# Patient Record
Sex: Male | Born: 1955 | Race: Black or African American | Hispanic: No
Health system: Midwestern US, Community
[De-identification: ages and names within clinical notes are randomized; demographics above are authoritative.]

## PROBLEM LIST (undated history)

## (undated) DIAGNOSIS — M1990 Unspecified osteoarthritis, unspecified site: Secondary | ICD-10-CM

## (undated) DIAGNOSIS — M109 Gout, unspecified: Secondary | ICD-10-CM

## (undated) DIAGNOSIS — J449 Chronic obstructive pulmonary disease, unspecified: Secondary | ICD-10-CM

## (undated) DIAGNOSIS — E119 Type 2 diabetes mellitus without complications: Secondary | ICD-10-CM

## (undated) DIAGNOSIS — I10 Essential (primary) hypertension: Secondary | ICD-10-CM

## (undated) HISTORY — PX: HERNIA REPAIR: SHX51

## (undated) HISTORY — PX: APPENDECTOMY: SHX54

---

## 2001-05-09 ENCOUNTER — Emergency Department (HOSPITAL_COMMUNITY): Admission: EM | Admit: 2001-05-09 | Discharge: 2001-05-10 | Payer: Self-pay | Admitting: Emergency Medicine

## 2001-05-13 ENCOUNTER — Emergency Department (HOSPITAL_COMMUNITY): Admission: EM | Admit: 2001-05-13 | Discharge: 2001-05-13 | Payer: Self-pay | Admitting: *Deleted

## 2001-05-13 ENCOUNTER — Encounter: Payer: Self-pay | Admitting: Emergency Medicine

## 2001-07-10 ENCOUNTER — Emergency Department (HOSPITAL_COMMUNITY): Admission: EM | Admit: 2001-07-10 | Discharge: 2001-07-10 | Payer: Self-pay | Admitting: Emergency Medicine

## 2001-07-10 ENCOUNTER — Encounter: Payer: Self-pay | Admitting: Emergency Medicine

## 2012-08-08 MED ADMIN — benazepril/hydrochlorothiazide (LOTENSIN HCT) 10/12.5 mg: ORAL | @ 16:00:00 | NDC 51079014501

## 2012-08-08 MED ADMIN — ibuprofen (MOTRIN) tablet 400 mg: ORAL | @ 16:00:00 | NDC 68084065811

## 2012-08-08 MED FILL — IBUPROFEN 400 MG TAB: 400 mg | ORAL | Qty: 1

## 2012-08-08 MED FILL — BENAZEPRIL 10 MG TAB: 10 mg | ORAL | Qty: 1

## 2012-08-08 NOTE — ED Notes (Signed)
Patient resting quietly in bed. Respirations present. No distress noted. No interventions needed at this time. All normal and/or abnormal vital signs reported per protocol. No orders received at this time.

## 2012-08-08 NOTE — ED Notes (Signed)
Awaiting med from pharmacy for administration

## 2012-08-08 NOTE — ED Notes (Signed)
Headache, out of bp meds times 2 weeks.  Unsure of the med he takes and is from [place] originally.  Keeps stating that he needs his BP meds and he will be fine.

## 2012-08-08 NOTE — ED Notes (Signed)
I have reviewed discharge instructions with the patient.  The patient and spouse verbalized understanding.

## 2012-08-08 NOTE — ED Provider Notes (Signed)
HPI Comments: 57 bm complains of headache because he is out of his blood pressure medications. (he does not remember what he is on) he did say it was "something hctz". No vomiting or fever. No neck pain.     Patient is a 57 y.o. male presenting with headaches.   Headache   Pertinent negatives include no fever and no shortness of breath.        Past Medical History   Diagnosis Date   ??? Hypertension         Past Surgical History   Procedure Laterality Date   ??? Hx appendectomy     ??? Hx other surgical  hernia         History reviewed. No pertinent family history.     History     Social History   ??? Marital Status: LEGALLY SEPARATED     Spouse Name: N/A     Number of Children: N/A   ??? Years of Education: N/A     Occupational History   ??? Not on file.     Social History Main Topics   ??? Smoking status: Current Every Day Smoker -- 1.00 packs/day   ??? Smokeless tobacco: Not on file   ??? Alcohol Use: Yes      Comment: moderately   ??? Drug Use: No   ??? Sexually Active: Not on file     Other Topics Concern   ??? Not on file     Social History Narrative   ??? No narrative on file                  ALLERGIES: Penicillins      Review of Systems   Constitutional: Negative for fever and chills.   HENT: Negative for sore throat, trouble swallowing, neck pain, neck stiffness and sinus pressure.    Eyes: Negative for visual disturbance.   Respiratory: Negative for chest tightness and shortness of breath.    Cardiovascular: Negative for chest pain.   Gastrointestinal: Negative for abdominal pain and constipation.   Musculoskeletal: Negative for back pain.   Skin: Negative for rash.   Neurological: Positive for headaches.   All other systems reviewed and are negative.        Filed Vitals:    08/08/12 1137   BP: 134/92   Pulse: 90   Temp: 98.3 ??F (36.8 ??C)   Resp: 16   Height: 5\' 9"  (1.753 m)   Weight: 108.863 kg (240 lb)   SpO2: 97%            Physical Exam   Nursing note and vitals reviewed.  Constitutional: He is oriented to person, place, and  time. He appears well-developed and well-nourished. No distress.   HENT:   Head: Normocephalic and atraumatic.   Right Ear: External ear normal.   Left Ear: External ear normal.   Nose: Nose normal.   Mouth/Throat: Oropharynx is clear and moist. No oropharyngeal exudate.   Eyes: Conjunctivae and EOM are normal. Pupils are equal, round, and reactive to light. Right eye exhibits no discharge. Left eye exhibits no discharge. No scleral icterus.   Neck: Normal range of motion. Neck supple. No JVD present.   Cardiovascular: Normal rate, regular rhythm, normal heart sounds and intact distal pulses.  Exam reveals no gallop and no friction rub.    No murmur heard.  Pulmonary/Chest: Effort normal and breath sounds normal. No stridor. No respiratory distress.   Abdominal: Soft. Bowel sounds are normal. There is no  tenderness. There is no rebound.   Musculoskeletal: Normal range of motion. He exhibits no tenderness.   Neurological: He is alert and oriented to person, place, and time. He displays normal reflexes. No cranial nerve deficit. He exhibits normal muscle tone. Coordination normal.   Skin: Skin is warm. No rash noted.        MDM    Procedures

## 2015-12-14 DIAGNOSIS — Z139 Encounter for screening, unspecified: Secondary | ICD-10-CM

## 2015-12-15 ENCOUNTER — Ambulatory Visit (INDEPENDENT_AMBULATORY_CARE_PROVIDER_SITE_OTHER): Payer: Medicaid Other | Admitting: Family Medicine

## 2015-12-15 ENCOUNTER — Encounter: Payer: Self-pay | Admitting: Family Medicine

## 2015-12-15 DIAGNOSIS — Z8709 Personal history of other diseases of the respiratory system: Secondary | ICD-10-CM | POA: Insufficient documentation

## 2015-12-15 DIAGNOSIS — I10 Essential (primary) hypertension: Secondary | ICD-10-CM | POA: Diagnosis not present

## 2015-12-15 DIAGNOSIS — R739 Hyperglycemia, unspecified: Secondary | ICD-10-CM | POA: Diagnosis not present

## 2015-12-15 LAB — BASIC METABOLIC PANEL WITH GFR
BUN: 15 mg/dL (ref 7–25)
CO2: 24 mmol/L (ref 20–31)
Calcium: 9.7 mg/dL (ref 8.6–10.3)
Chloride: 102 mmol/L (ref 98–110)
Creat: 1.14 mg/dL (ref 0.70–1.25)
GFR, Est African American: 80 mL/min (ref >=60)
GFR, Est Non African American: 69 mL/min (ref >=60)
Glucose, Bld: 141 mg/dL — ABNORMAL HIGH (ref 65–99)
Potassium: 3.6 mmol/L (ref 3.5–5.3)
Sodium: 141 mmol/L (ref 135–146)

## 2015-12-15 LAB — CBC
HCT: 41.5 % (ref 38.5–50.0)
Hemoglobin: 13.6 g/dL (ref 13.2–17.1)
MCH: 28.5 pg (ref 27.0–33.0)
MCHC: 32.8 g/dL (ref 32.0–36.0)
MCV: 86.8 fL (ref 80.0–100.0)
MPV: 12.6 fL — ABNORMAL HIGH (ref 7.5–12.5)
Platelets: 253 10*3/uL (ref 140–400)
RBC: 4.78 MIL/uL (ref 4.20–5.80)
RDW: 16.2 % — ABNORMAL HIGH (ref 11.0–15.0)
WBC: 7.5 10*3/uL (ref 3.8–10.8)

## 2015-12-15 LAB — POCT GLYCOSYLATED HEMOGLOBIN (HGB A1C): Hemoglobin A1C: 7.5

## 2015-12-15 MED ORDER — HYDROCHLOROTHIAZIDE 25 MG PO TABS: 25.0000 mg | ORAL_TABLET | Freq: Every day | ORAL | 2 refills | Status: DC

## 2015-12-15 MED ORDER — LISINOPRIL 40 MG PO TABS: 40.0000 mg | ORAL_TABLET | Freq: Every day | ORAL | 2 refills | Status: DC

## 2015-12-15 NOTE — Patient Instructions (Signed)
It was a pleasure to meet you today. Please see below to review our plan for today's visit.  1. I have refilled you prescriptions for your HCTZ and lisinopril to [facility] off [address]. Phone number is [phone] if you need to reach them. 2. Please stop smoking because this will make it difficult to help you COPD or other medical problems. 3. I will see you back in 1 month.  Please call the clinic at [phone] if your symptoms worsen or you have any concerns. It was my pleasure to see you. -- Durward Parcelavid Lujean Ebright, DO [facility], PGY-1  Smoking Cessation, Tips for Success If you are ready to quit smoking, congratulations! You have chosen to help yourself be healthier. Cigarettes bring nicotine, tar, carbon monoxide, and other irritants into your body. Your lungs, heart, and blood vessels will be able to work better without these poisons. There are many different ways to quit smoking. Nicotine gum, nicotine patches, a nicotine inhaler, or nicotine nasal spray can help with physical craving. Hypnosis, support groups, and medicines help break the habit of smoking. WHAT THINGS CAN I DO TO MAKE QUITTING EASIER?  Here are some tips to help you quit for good:  Pick a date when you will quit smoking completely. Tell all of your friends and family about your plan to quit on that date.  Do not try to slowly cut down on the number of cigarettes you are smoking. Pick a quit date and quit smoking completely starting on that day.  Throw away all cigarettes.   Clean and remove all ashtrays from your home, work, and car.  On a card, write down your reasons for quitting. Carry the card with you and read it when you get the urge to smoke.  Cleanse your body of nicotine. Drink enough water and fluids to keep your urine clear or pale yellow. Do this after quitting to flush the nicotine from your body.  Learn to predict your moods. Do not let a bad situation be your excuse to  have a cigarette. Some situations in your life might tempt you into wanting a cigarette.  Never have "just one" cigarette. It leads to wanting another and another. Remind yourself of your decision to quit.  Change habits associated with smoking. If you smoked while driving or when feeling stressed, try other activities to replace smoking. Stand up when drinking your coffee. Brush your teeth after eating. Sit in a different chair when you read the paper. Avoid alcohol while trying to quit, and try to drink fewer caffeinated beverages. Alcohol and caffeine may urge you to smoke.  Avoid foods and drinks that can trigger a desire to smoke, such as sugary or spicy foods and alcohol.  Ask people who smoke not to smoke around you.  Have something planned to do right after eating or having a cup of coffee. For example, plan to take a walk or exercise.  Try a relaxation exercise to calm you down and decrease your stress. Remember, you may be tense and nervous for the first 2 weeks after you quit, but this will pass.  Find new activities to keep your hands busy. Play with a pen, coin, or rubber band. Doodle or draw things on paper.  Brush your teeth right after eating. This will help cut down on the craving for the taste of tobacco after meals. You can also try mouthwash.   Use oral substitutes in place of cigarettes. Try using lemon  drops, carrots, cinnamon sticks, or chewing gum. Keep them handy so they are available when you have the urge to smoke.  When you have the urge to smoke, try deep breathing.  Designate your home as a nonsmoking area.  If you are a heavy smoker, ask your health care provider about a prescription for nicotine chewing gum. It can ease your withdrawal from nicotine.  Reward yourself. Set aside the cigarette money you save and buy yourself something nice.  Look for support from others. Join a support group or smoking cessation program. Ask someone at home or at work to  help you with your plan to quit smoking.  Always ask yourself, "Do I need this cigarette or is this just a reflex?" Tell yourself, "Today, I choose not to smoke," or "I do not want to smoke." You are reminding yourself of your decision to quit.  Do not replace cigarette smoking with electronic cigarettes (commonly called e-cigarettes). The safety of e-cigarettes is unknown, and some may contain harmful chemicals.  If you relapse, do not give up! Plan ahead and think about what you will do the next time you get the urge to smoke. HOW WILL I FEEL WHEN I QUIT SMOKING? You may have symptoms of withdrawal because your body is used to nicotine (the addictive substance in cigarettes). You may crave cigarettes, be irritable, feel very hungry, cough often, get headaches, or have difficulty concentrating. The withdrawal symptoms are only temporary. They are strongest when you first quit but will go away within 10-14 days. When withdrawal symptoms occur, stay in control. Think about your reasons for quitting. Remind yourself that these are signs that your body is healing and getting used to being without cigarettes. Remember that withdrawal symptoms are easier to treat than the major diseases that smoking can cause.  Even after the withdrawal is over, expect periodic urges to smoke. However, these cravings are generally short lived and will go away whether you smoke or not. Do not smoke! WHAT RESOURCES ARE AVAILABLE TO HELP ME QUIT SMOKING? Your health care provider can direct you to community resources or hospitals for support, which may include:  Group support.  Education.  Hypnosis.  Therapy.   This information is not intended to replace advice given to you by your health care provider. Make sure you discuss any questions you have with your health care provider.   Document Released: 11/25/2003 Document Revised: 03/19/2014 Document Reviewed: 08/14/2012 Elsevier Interactive Patient Education [employer].

## 2015-12-15 NOTE — Progress Notes (Signed)
Subjective:   Patient ID: Ernest White    DOB: June 01, 1955, 60 y.o. male   MRN: [id number]  CC: New patient visit  HPI: Ernest White is a 60 y.o. male who presents to clinic today to establish as a new patient. Problems discussed today are as follows:   1. Hypertension: takes HCTZ and lisinopril. Says he takes his medications regularly and check BP occasionally. Denies symptoms of headache, change in vision, or feeling of lightheadedness.  2. History of COPD: patient says he has COPD. Never seen a pulmonologist and no history of COPD exacerbation. Says his only medication is an inhaler which he left at home but cannot recall name. Says he has home O2 but left the canister in [place] he moved from. Has not needed it since moving to [place]? Significant 40+ pack year, continues to smoke 1 pack per day.  3. Hyperglycemia: patient denies history of diabetes. Denies taking insulin in the past. Says he is experiencing polyuria, polyphagia, and polydipsia.   ROS: See HPI for pertinent ROS.  PMFSH: Pertinent past medical, surgical, family, and social history were reviewed and updated as appropriate. Smoking status reviewed.  Medications reviewed. Current Outpatient Prescriptions  Medication Sig Dispense Refill  . B Complex-C (B-COMPLEX WITH VITAMIN C) tablet Take 1 tablet by mouth daily.    . hydrochlorothiazide (HYDRODIURIL) 25 MG tablet Take 1 tablet (25 mg total) by mouth daily. 90 tablet 2  . lisinopril (PRINIVIL,ZESTRIL) 40 MG tablet Take 1 tablet (40 mg total) by mouth daily. 90 tablet 2   No current facility-administered medications for this visit.     Objective:   BP (!) 141/99   Pulse 100   Temp 97.6 F (36.4 C)   Wt 267 lb 3.2 oz (121.2 kg)   SpO2 98%  Vitals and nursing note reviewed.  General: obese, well nourished, well developed, in no acute distress with non-toxic appearance HEENT: normocephalic, atraumatic, moist mucous membranes Neck: supple, non-tender without  lymphadenopathy CV: regular rate and rhythm without murmurs, rubs, or gallops Lungs: clear to auscultation bilaterally with normal work of breathing Abdomen: soft, non-tender, non-distended, no masses or organomegaly palpable, normoactive bowel sounds Skin: warm, dry, no rashes or lesions, cap refill < 2 seconds, scar from appendectomy appreciated, no herniation Extremities: warm and well perfused, normal tone  Assessment & Plan:   Essential hypertension Chronic. BP 141/99 in office today. Risk factors include smoking and obesity.  - Lisinopril 40 g QD - HCTZ 25 mg QD - Encouraged patient to stop smoking, patient aware but unwilling today - CBC and BMET pending  History of COPD Uncertain extent of COPD due to vague history and no documentation from previous practice. Will need to reach out to previous PCP. Patient continues to smoke. Patient did not bring inhaler meds and is suppose to be on supplemental O2 but left it in [place] when he moved but no increased work of breathing during exam. - Patient to bring inhaler meds to next visit, will call if he needs refills - Encouraged patient to ask someone to bring his O2 down from [place] where he left it - Counseled patient on smoking cessation  Hyperglycemia A1c 7.5 in office today, diagnostic for diabetes. Patient is obese and does not take diabetes medications or insulin. - Patient counseled on restricting excessive carbs in diet - Encouraged exercising 150 mins per week - Patient will need to begin diabetes education and medication during next visit given A1c  Orders Placed This  Encounter  Procedures  . BASIC METABOLIC PANEL WITH GFR  . CBC  . POCT glycosylated hemoglobin (Hb A1C)   Meds ordered this encounter  Medications  . DISCONTD: lisinopril (PRINIVIL,ZESTRIL) 40 MG tablet    Sig: Take 40 mg by mouth daily.  Marland Kitchen DISCONTD: hydrochlorothiazide (HYDRODIURIL) 25 MG tablet    Sig: Take 25 mg by mouth daily.  . B Complex-C (B-COMPLEX WITH  VITAMIN C) tablet    Sig: Take 1 tablet by mouth daily.  Marland Kitchen lisinopril (PRINIVIL,ZESTRIL) 40 MG tablet    Sig: Take 1 tablet (40 mg total) by mouth daily.    Dispense:  90 tablet    Refill:  2  . hydrochlorothiazide (HYDRODIURIL) 25 MG tablet    Sig: Take 1 tablet (25 mg total) by mouth daily.    Dispense:  90 tablet    Refill:  2    Durward Parcel, DO [facility], PGY-1 12/15/2015 8:37 PM

## 2015-12-15 NOTE — Assessment & Plan Note (Signed)
A1c 7.5 in office today, diagnostic for diabetes. Patient is obese and does not take diabetes medications or insulin. - Patient counseled on restricting excessive carbs in diet - Encouraged exercising 150 mins per week - Patient will need to begin diabetes education and medication during next visit given A1c

## 2015-12-15 NOTE — Assessment & Plan Note (Signed)
Uncertain extent of COPD due to vague history and no documentation from previous practice. Will need to reach out to previous PCP. Patient continues to smoke. Patient did not bring inhaler meds and is suppose to be on supplemental O2 but left it in [place] when he moved but no increased work of breathing during exam. - Patient to bring inhaler meds to next visit, will call if he needs refills - Encouraged patient to ask someone to bring his O2 down from [place] where he left it - Counseled patient on smoking cessation

## 2015-12-15 NOTE — Assessment & Plan Note (Addendum)
Chronic. BP 141/99 in office today. Risk factors include smoking and obesity.  - Lisinopril 40 g QD - HCTZ 25 mg QD - Encouraged patient to stop smoking, patient aware but unwilling today - CBC and BMET pending

## 2015-12-22 ENCOUNTER — Telehealth: Payer: Self-pay | Admitting: Family Medicine

## 2015-12-22 NOTE — Telephone Encounter (Signed)
Pt has been on oxygen 24/7, but is out. Pt has an appointment 12-27-15. Please call [phone].Please advise. Thanks! ep

## 2015-12-23 ENCOUNTER — Encounter: Payer: Self-pay | Admitting: Internal Medicine

## 2015-12-23 ENCOUNTER — Ambulatory Visit (INDEPENDENT_AMBULATORY_CARE_PROVIDER_SITE_OTHER): Payer: Medicaid Other | Admitting: Internal Medicine

## 2015-12-23 ENCOUNTER — Encounter: Payer: Self-pay | Admitting: Pediatric Intensive Care

## 2015-12-23 DIAGNOSIS — J441 Chronic obstructive pulmonary disease with (acute) exacerbation: Secondary | ICD-10-CM | POA: Diagnosis not present

## 2015-12-23 DIAGNOSIS — J449 Chronic obstructive pulmonary disease, unspecified: Secondary | ICD-10-CM | POA: Insufficient documentation

## 2015-12-23 MED ORDER — PREDNISONE 50 MG PO TABS: ORAL_TABLET | ORAL | 0 refills | Status: DC

## 2015-12-23 MED ORDER — DOXYCYCLINE HYCLATE 100 MG PO CAPS: 100.0000 mg | ORAL_CAPSULE | Freq: Two times a day (BID) | ORAL | 0 refills | Status: DC

## 2015-12-23 MED ORDER — ALBUTEROL SULFATE HFA 108 (90 BASE) MCG/ACT IN AERS: 2.0000 | INHALATION_SPRAY | Freq: Four times a day (QID) | RESPIRATORY_TRACT | 2 refills | Status: DC | PRN

## 2015-12-23 NOTE — Progress Notes (Signed)
   [facility] Noralee CharsAsiyah Mikell, MD Phone: [phone]  Reason For Visit:  SDA for Worsening Cough   # Patient with a hx of COPD presenting with worsening cough and sputum production over the past 2 days. He feels that he is coming down with cold. Patient with increasing yellow-green sputum production. Patient has been having worsening cough. He is a currently out of his albuterol inhaler. Patient denies any fever or chills, nausea or vomiting.  Patient is new to area and recently established care with Dr. Abelardo DieselMcMullen.   Past Medical History Reviewed problem list.  Medications- reviewed and updated No additions to family history Social history- patient is a current smoker  Objective: BP (!) 143/92   Pulse (!) 113   Temp 97.7 F (36.5 C) (Oral)   Wt 269 lb (122 kg)   SpO2 91%  Gen: NAD, alert, cooperative with exam,   HEENT: Normal    Neck: No masses palpated. No lymphadenopathy    Ears: Tympanic membranes intact, normal light reflex, no erythema, no bulging    Nose: nasal turbinates erythematous and congested     Throat: moist mucus membranes, no erythema Cardio: regular rate and rhythm, S1S2 heard, no murmurs appreciated Pulm: clear to auscultation bilaterally, no wheezes, rhonchi or rales Skin: dry, intact, no rashes or lesions   Assessment/Plan: See problem based a/p  COPD exacerbation (HCC) COPD exacerbation, mild - with worsening cough and sputum production.  Exam without significant wheezing or rhonchi. Repeat pulse on 86 on exam, otherwise vitals wnl, pulse likely elevated from rushing to get to clinic  - predniSONE (DELTASONE) 50 MG tablet; Take 1 pill daily for 5 days.  Dispense: 5 tablet; Refill: 0 - doxycycline (VIBRAMYCIN) 100 MG capsule; Take 1 capsule (100 mg total) by mouth 2 (two) times daily.  Dispense: 14 capsule; Refill: 0 - albuterol (PROVENTIL HFA;VENTOLIN HFA) 108 (90 Base) MCG/ACT inhaler; Inhale 2 puffs into the lungs every 6 (six)  hours as needed for wheezing or shortness of breath.  Dispense: 1 Inhaler; Refill: 2

## 2015-12-23 NOTE — Telephone Encounter (Signed)
Patient seen at appointment today for SOB, will follow up with PCP as far as getting O2 ordered. FYI to PCP

## 2015-12-23 NOTE — Patient Instructions (Signed)
Please take prednisone one pill for 5 days. Please take doxycycline 1 pill morning one pill at night. You continue your albuterol inhaler ( 2 puffs) every 4-6 hours as needed during this exacerbation.  Chronic Obstructive Pulmonary Disease Chronic obstructive pulmonary disease (COPD) is a common lung condition in which airflow from the lungs is limited. COPD is a general term that can be used to describe many different lung problems that limit airflow, including both chronic bronchitis and emphysema. If you have COPD, your lung function will probably never return to normal, but there are measures you can take to improve lung function and make yourself feel better. CAUSES   Smoking (common).  Exposure to secondhand smoke.  Genetic problems.  Chronic inflammatory lung diseases or recurrent infections. SYMPTOMS  Shortness of breath, especially with physical activity.  Deep, persistent (chronic) cough with a large amount of thick mucus.  Wheezing.  Rapid breaths (tachypnea).  Gray or bluish discoloration (cyanosis) of the skin, especially in your fingers, toes, or lips.  Fatigue.  Weight loss.  Frequent infections or episodes when breathing symptoms become much worse (exacerbations).  Chest tightness. DIAGNOSIS Your health care provider will take a medical history and perform a physical examination to diagnose COPD. Additional tests for COPD may include:  Lung (pulmonary) function tests.  Chest X-ray.  CT scan.  Blood tests. TREATMENT  Treatment for COPD may include:  Inhaler and nebulizer medicines. These help manage the symptoms of COPD and make your breathing more comfortable.  Supplemental oxygen. Supplemental oxygen is only helpful if you have a low oxygen level in your blood.  Exercise and physical activity. These are beneficial for nearly all people with COPD.  Lung surgery or transplant.  Nutrition therapy to gain weight, if you are underweight.  Pulmonary  rehabilitation. This may involve working with a team of health care providers and specialists, such as respiratory, occupational, and physical therapists. HOME CARE INSTRUCTIONS  Take all medicines (inhaled or pills) as directed by your health care provider.  Avoid over-the-counter medicines or cough syrups that dry up your airway (such as antihistamines) and slow down the elimination of secretions unless instructed otherwise by your health care provider.  If you are a smoker, the most important thing that you can do is stop smoking. Continuing to smoke will cause further lung damage and breathing trouble. Ask your health care provider for help with quitting smoking. He or she can direct you to community resources or hospitals that provide support.  Avoid exposure to irritants such as smoke, chemicals, and fumes that aggravate your breathing.  Use oxygen therapy and pulmonary rehabilitation if directed by your health care provider. If you require home oxygen therapy, ask your health care provider whether you should purchase a pulse oximeter to measure your oxygen level at home.  Avoid contact with individuals who have a contagious illness.  Avoid extreme temperature and humidity changes.  Eat healthy foods. Eating smaller, more frequent meals and resting before meals may help you maintain your strength.  Stay active, but balance activity with periods of rest. Exercise and physical activity will help you maintain your ability to do things you want to do.  Preventing infection and hospitalization is very important when you have COPD. Make sure to receive all the vaccines your health care provider recommends, especially the pneumococcal and influenza vaccines. Ask your health care provider whether you need a pneumonia vaccine.  Learn and use relaxation techniques to manage stress.  Learn and use controlled breathing  techniques as directed by your health care provider. Controlled breathing  techniques include:  Pursed lip breathing. Start by breathing in (inhaling) through your nose for 1 second. Then, purse your lips as if you were going to whistle and breathe out (exhale) through the pursed lips for 2 seconds.  Diaphragmatic breathing. Start by putting one hand on your abdomen just above your waist. Inhale slowly through your nose. The hand on your abdomen should move out. Then purse your lips and exhale slowly. You should be able to feel the hand on your abdomen moving in as you exhale.  Learn and use controlled coughing to clear mucus from your lungs. Controlled coughing is a series of short, progressive coughs. The steps of controlled coughing are: 1. Lean your head slightly forward. 2. Breathe in deeply using diaphragmatic breathing. 3. Try to hold your breath for 3 seconds. 4. Keep your mouth slightly open while coughing twice. 5. Spit any mucus out into a tissue. 6. Rest and repeat the steps once or twice as needed. SEEK MEDICAL CARE IF:  You are coughing up more mucus than usual.  There is a change in the color or thickness of your mucus.  Your breathing is more labored than usual.  Your breathing is faster than usual. SEEK IMMEDIATE MEDICAL CARE IF:  You have shortness of breath while you are resting.  You have shortness of breath that prevents you from:  Being able to talk.  Performing your usual physical activities.  You have chest pain lasting longer than 5 minutes.  Your skin color is more cyanotic than usual.  You measure low oxygen saturations for longer than 5 minutes with a pulse oximeter. MAKE SURE YOU:  Understand these instructions.  Will watch your condition.  Will get help right away if you are not doing well or get worse.   This information is not intended to replace advice given to you by your health care provider. Make sure you discuss any questions you have with your health care provider.   Document Released: 12/06/2004 Document  Revised: 03/19/2014 Document Reviewed: 10/23/2012 Elsevier Interactive Patient Education [employer]2016 [employer]

## 2015-12-23 NOTE — Congregational Nurse Program (Signed)
Congregational Nurse Program Note  Date of Encounter: 12/14/2015  Past Medical History: No past medical history on file.  Encounter Details:     CNP Questionnaire - 12/14/15 0954      Patient Demographics   Is this a new or existing patient? New   Patient is considered a/an Not Applicable   Race African-American/Black     Patient Assistance   Location of Patient Assistance Not Applicable   Patient's financial/insurance status Low Income;Medicaid;Medicare   Uninsured Patient No   Patient referred to apply for the following financial assistance Not Applicable   Food insecurities addressed Not Applicable   Transportation assistance Yes   Type of Assistance Bus Pass Given   Assistance securing medications No   Educational health offerings Chronic disease     Encounter Details   Primary purpose of visit Chronic Illness/Condition Visit;Navigating the Healthcare System   Was an Emergency Department visit averted? Not Applicable   Does patient have a medical provider? No   Patient referred to Private Practice   Was a mental health screening completed? (GAINS tool) No   Does patient have dental issues? No   Does patient have vision issues? No   Does your patient have an abnormal blood pressure today? No   Since previous encounter, have you referred patient for abnormal blood pressure that resulted in a new diagnosis or medication change? No   Does your patient have an abnormal blood glucose today? No   Since previous encounter, have you referred patient for abnormal blood glucose that resulted in a new diagnosis or medication change? No   Was there a life-saving intervention made? No         Amb Nursing Assessment - 12/15/15 0949      Pre-visit preparation   Pre-visit preparation completed Yes     Abuse/Neglect Assessment   Do you feel unsafe in your current relationship? No   Do you feel physically threatened by others? No   Anyone hurting you at home, work, or school? No    Unable to ask? No     [facility] the shelter.  Has multiple health problems.  Assisted client with making appointment with [facility].  Bus passes provided

## 2015-12-26 ENCOUNTER — Encounter: Payer: Self-pay | Admitting: Pediatric Intensive Care

## 2015-12-26 NOTE — Assessment & Plan Note (Signed)
COPD exacerbation, mild - with worsening cough and sputum production.  Exam without significant wheezing or rhonchi. Repeat pulse on 86 on exam, otherwise vitals wnl, pulse likely elevated from rushing to get to clinic  - predniSONE (DELTASONE) 50 MG tablet; Take 1 pill daily for 5 days.  Dispense: 5 tablet; Refill: 0 - doxycycline (VIBRAMYCIN) 100 MG capsule; Take 1 capsule (100 mg total) by mouth 2 (two) times daily.  Dispense: 14 capsule; Refill: 0 - albuterol (PROVENTIL HFA;VENTOLIN HFA) 108 (90 Base) MCG/ACT inhaler; Inhale 2 puffs into the lungs every 6 (six) hours as needed for wheezing or shortness of breath.  Dispense: 1 Inhaler; Refill: 2

## 2015-12-27 ENCOUNTER — Ambulatory Visit (INDEPENDENT_AMBULATORY_CARE_PROVIDER_SITE_OTHER): Payer: Medicaid Other | Admitting: Family Medicine

## 2015-12-27 ENCOUNTER — Encounter: Payer: Self-pay | Admitting: Family Medicine

## 2015-12-27 ENCOUNTER — Ambulatory Visit (HOSPITAL_COMMUNITY)
Admission: RE | Admit: 2015-12-27 | Discharge: 2015-12-27 | Disposition: A | Discharge status: Home / Self Care | Payer: Medicaid Other | Source: Ambulatory Visit | Attending: Family Medicine | Admitting: Family Medicine

## 2015-12-27 DIAGNOSIS — R55 Syncope and collapse: Secondary | ICD-10-CM | POA: Insufficient documentation

## 2015-12-27 DIAGNOSIS — Z8709 Personal history of other diseases of the respiratory system: Secondary | ICD-10-CM | POA: Diagnosis not present

## 2015-12-27 DIAGNOSIS — J441 Chronic obstructive pulmonary disease with (acute) exacerbation: Secondary | ICD-10-CM

## 2015-12-27 DIAGNOSIS — E1165 Type 2 diabetes mellitus with hyperglycemia: Secondary | ICD-10-CM

## 2015-12-27 DIAGNOSIS — E119 Type 2 diabetes mellitus without complications: Secondary | ICD-10-CM | POA: Insufficient documentation

## 2015-12-27 MED ORDER — MOMETASONE FURO-FORMOTEROL FUM 200-5 MCG/ACT IN AERO: 2.0000 | INHALATION_SPRAY | Freq: Two times a day (BID) | RESPIRATORY_TRACT | 0 refills | Status: DC

## 2015-12-27 NOTE — Assessment & Plan Note (Addendum)
Uncontrolled. Recently tx for exacerbation. On last day of prednisone 50 mg for 5 day course. Also completing 7 day course of doxycycline. Says he needs O2 for home but does not qualify due to sat staying above 95% during [facility]. Not on controller medication and needs rescue inhaler refill in next few weeks. - Given sample of Dulera, to take BID - Told to decrease albuterol use to only PRN - No indications for home O2 at this time - To complete course of prednisone and doxy - Educated on smoking cessation, will consider referral to Dr. Raymondo BandKoval during next visit - F/u in 2 weeks

## 2015-12-27 NOTE — Assessment & Plan Note (Addendum)
Chronic. Uncertain etiology. Most concerned about cardiac origin given COPD and tobacco use history. No documentation of cardiac care. EKG in clinic NSR without heart block or ST changes. Other ddx includes vasovagal response or seizure but less likely given inconsistent history and lack of anti-epileptic use in past. - Cardiac referral placed - ECHO scheduled  - May consider 48 hour Holter monitor

## 2015-12-27 NOTE — Assessment & Plan Note (Deleted)
Uncontrolled. Recently tx for exacerbation. On last day of prednisone 50 mg for 5 day course. Also completing 7 day course of doxycycline. Says he needs O2 for home but does not qualify due to sat staying above 95% [facility]. Not on controller medication and needs rescue inhaler refill in next few weeks. - Given sample of Dulera, to take BID - Told to decrease albuterol use to only PRN - No indications for home O2 at this time - To complete course of prednisone and doxy - Educated on smoking cessation, will consider referral to Dr. Raymondo BandKoval during next visit

## 2015-12-27 NOTE — Assessment & Plan Note (Deleted)
A 

## 2015-12-27 NOTE — Progress Notes (Signed)
Subjective:   Patient ID: Ernest White    DOB: [birth year], 60 y.o. male   MRN: [id number]  CC: Blackouts  HPI: Ernest GlassDwain L Chastain is a 60 y.o. male who presents to clinic today for blackouts. Problems discussed today are as follows:  1. Syncope with Fall: chronic issue per patient. Last fall 3 days ago on neighbors front steps. Says episodes are random and are not triggered with exertion. Has LOC during attacks lasting 2-3 minutes w/o post-ictal state. Never diagnosed with seizures though patient admits to trying to be seen by neurology in [place] [facility] [place] [facility] but never followed up with appointment. Denies chest pain, nausea, lightheadedness, vertigo. Has some h/o urinary incontinence in past after episodes but intermittent in frequency.   2. COPD: was seen in clinic 12/23/15 for concerns of COPD exacerbation in the setting of URI. Was given prednisone 50 mg for 5 days and doxycycline for 1 week. Also given albuterol inhaler Rx with refills but patient never filled due to financial restriction. Was given albuterol inhaler for free by pharmacist without refills. Says he will get money next week and pick up inhaler. Also notes to have controller medication but lost it and connot remember medication. URI symptoms have resolved and patient denies fevers, nausea, vomiting, chest pain, dyspnea at present.   ROS: See HPI for pertinent ROS.  PMFSH: Pertinent past medical, surgical, family, and social history were reviewed and updated as appropriate. Smoking status reviewed.  Medications reviewed. Current Outpatient Prescriptions  Medication Sig Dispense Refill  . albuterol (PROVENTIL HFA;VENTOLIN HFA) 108 (90 Base) MCG/ACT inhaler Inhale 2 puffs into the lungs every 6 (six) hours as needed for wheezing or shortness of breath. 1 Inhaler 2  . B Complex-C (B-COMPLEX WITH VITAMIN C) tablet Take 1 tablet by mouth daily.    Marland Kitchen. doxycycline (VIBRAMYCIN) 100 MG capsule Take 1 capsule (100 mg total)  by mouth 2 (two) times daily. 14 capsule 0  . hydrochlorothiazide (HYDRODIURIL) 25 MG tablet Take 1 tablet (25 mg total) by mouth daily. 90 tablet 2  . lisinopril (PRINIVIL,ZESTRIL) 40 MG tablet Take 1 tablet (40 mg total) by mouth daily. 90 tablet 2  . predniSONE (DELTASONE) 50 MG tablet Take 1 pill daily for 5 days. 5 tablet 0  . mometasone-formoterol (DULERA) 200-5 MCG/ACT AERO Inhale 2 puffs into the lungs 2 (two) times daily. 1 Inhaler 0   No current facility-administered medications for this visit.     Objective:   BP (!) 146/114   Pulse (!) 114   Temp 98 F (36.7 C) (Oral)   Wt 271 lb (122.9 kg)   SpO2 95%  Vitals and nursing note reviewed.  General: well nourished, well developed, in no acute distress with non-toxic appearance HEENT: normocephalic, atraumatic, moist mucous membranes Neck: supple, non-tender without lymphadenopathy CV: regular rate and rhythm without murmurs, rubs, or gallops Lungs: clear to auscultation bilaterally with normal work of breathing Abdomen: soft, non-tender, non-distended, no masses or organomegaly palpable, normoactive bowel sounds Skin: warm, dry, no rashes or lesions, cap refill < 2 seconds Extremities: warm and well perfused, normal tone Neuro: CN II-XII intact, no slurring of speech  Assessment & Plan:   COPD exacerbation (HCC) Uncontrolled. Recently tx for exacerbation. On last day of prednisone 50 mg for 5 day course. Also completing 7 day course of doxycycline. Says he needs O2 for home but does not qualify due to sat staying above 95% [facility]. Not on controller medication  and needs rescue inhaler refill in next few weeks. - Given sample of Dulera, to take BID - Told to decrease albuterol use to only PRN - No indications for home O2 at this time - To complete course of prednisone and doxy - Educated on smoking cessation, will consider referral to Dr. Raymondo Band during next visit  Syncope and collapse Chronic. Uncertain  etiology. Most concerned about cardiac origin given COPD and tobacco use history. No documentation of cardiac care. EKG in clinic NSR without heart block or ST changes. Other ddx includes vasovagal response or seizure but less likely given inconsistent history and lack of anti-epileptic use in past. - Cardiac referral placed - ECHO scheduled  - May consider 48 hour Holter monitor  Orders Placed This Encounter  Procedures  . Lipid panel  . Ambulatory referral to Cardiology    Referral Priority:   Urgent    Referral Type:   Consultation    Referral Reason:   Specialty Services Required    Requested Specialty:   Cardiology    Number of Visits Requested:   1  . EKG 12-Lead   Meds ordered this encounter  Medications  . mometasone-formoterol (DULERA) 200-5 MCG/ACT AERO    Sig: Inhale 2 puffs into the lungs 2 (two) times daily.    Dispense:  1 Inhaler    Refill:  0    Durward Parcel, DO [facility], PGY-1 12/27/2015 9:27 PM

## 2015-12-27 NOTE — Patient Instructions (Signed)
It was a pleasure to meet you today. Please see below to review our plan for today's visit.  1. I have started you on Dulera, a controller medication for your COPD. Take this twice per day. Continue taking your albuterol AS NEEDED, do not take this every day unless you are very short of breath. Please get albuterol refilled next week. 2. I will notify you of your blood work. 3. You have been referred to cardiology for your blackouts. They will call you about the appointment when available. 4. I have also scheduled you for an ECHO to look at your heart. This will need to be done at the hospital. You will not be sedated for this. They will call you with the appointment. 5. We will revisit your diabetes and smoking cessation in 2 weeks.  Please call the clinic at [phone] if your symptoms worsen or you have any concerns. It was my pleasure to see you. -- Harriet Butte, DO [place], PGY-1  Formoterol; Mometasone metered dose inhaler What is this medicine? FORMOTEROL; MOMETASONE (for [place] te rol; moe MET a sone) inhalation is a combination of two medicines that decrease inflammation and help to open up the airways in your lungs. It is used to treat asthma. Do NOT use in an acute asthma attack. This medicine may be used for other purposes; ask your health care provider or pharmacist if you have questions. What should I tell my health care provider before I take this medicine? They need to know if you have any of these conditions: -adrenal tumor -aneurysm -bone problems -diabetes -glaucoma -heart disease or irregular heartbeat -high blood pressure -immune system problems -infection -seizures -thyroid problems -worsening asthma -an unusual or allergic reaction to formoterol, mometasone, other medicines, foods, dyes, or preservatives -pregnant or trying to get pregnant -breast-feeding How should I use this medicine? This medicine is inhaled through the mouth. Follow  the directions on the prescription label. Rinse your mouth with water after use. Make sure not to swallow the water. Take your medicine at regular intervals. Do not take your medicine more often than directed. Do not stop taking except on your doctor's advice. Make sure that you are using your inhaler correctly. Ask your doctor or health care provider if you have any questions. A special MedGuide will be given to you by the pharmacist with each prescription and refill. Be sure to read this information carefully each time. Talk to your pediatrician regarding the use of this medicine in children. Special care may be needed. Overdosage: If you think you have taken too much of this medicine contact a poison control center or emergency room at once. NOTE: This medicine is only for you. Do not share this medicine with others. What if I miss a dose? If you miss a dose, use it as soon as you remember. If it is almost time for your next dose, use only that dose and continue with your regular schedule, spacing doses evenly. Do not use double or extra doses. What may interact with this medicine? Do not take this medicine with any of the following mediations: -MAOIs like Carbex, Eldepryl, Marplan, Nardil, and Parnate This medicine may also interact with the following medications: -aminophylline or theophylline -antiviral medicines for HIV or AIDS -certain antibiotics like clarithromycin, linezolid, and telithromycin -certain medicines for blood pressure, heart disease, or irregular heart beat -certain medicines for colds -certain medicines for depression or emotional conditions -certain medicines for fungal infections like ketoconazole and itraconazole -  diuretics -other medicines for breathing problems This list may not describe all possible interactions. Give your health care provider a list of all the medicines, herbs, non-prescription drugs, or dietary supplements you use. Also tell them if you smoke,  drink alcohol, or use illegal drugs. Some items may interact with your medicine. What should I watch for while using this medicine? Visit your doctor for regular check ups. Tell your doctor or health care professional if your symptoms do not get better. If your symptoms get worse or if you need your short-acting inhalers more often, call your doctor right away. Do not use this medicine more than every 12 hours. If you have asthma, be aware that using this medicine may increase your risk of dying from asthma-related problems. Talk to your doctor about the risks and benefits of taking this medicine. NEVER use this medicine for an acute asthma attack. This medicine may increase your risk of getting an infection. Tell your doctor or health care professional if you are around anyone with measles or chickenpox, or if you develop sores or blisters that do not heal properly. What side effects may I notice from receiving this medicine? Side effects that you should report to your doctor or health care professional as soon as possible: -allergic reactions like skin rash or hives, swelling of the face, lips, or tongue -breathing problems -chest pain -dizziness or lightheaded -fever or chills -high blood pressure -irregular heartbeat -vision problems Side effects that usually do not require medical attention (Report these to your doctor or health care professional if they continue or are bothersome.): -coughing, hoarseness, throat irritation -different taste in mouth -headache -nervousness -stomach upset -stuffy nose -tremor This list may not describe all possible side effects. Call your doctor for medical advice about side effects. You may report side effects to FDA at 1-800-FDA-1088. Where should I keep my medicine? Keep out of the reach of children. Store at room temperature between 59 and 86 degrees F (15 and 30 degrees C). Throw away the inhaler after the dose counter reaches 0 or after the  expiration date, whichever comes first. Avoid exposure to heat, fire, and flame. NOTE: This sheet is a summary. It may not cover all possible information. If you have questions about this medicine, talk to your doctor, pharmacist, or health care provider.    2016, Elsevier/Gold Standard. (2012-07-03 16:04:55)

## 2015-12-28 ENCOUNTER — Encounter: Payer: Self-pay | Admitting: Family Medicine

## 2015-12-28 LAB — LIPID PANEL
CHOL/HDL RATIO: 3.7 ratio (ref <=5.0)
CHOLESTEROL: 171 mg/dL (ref 125–200)
HDL: 46 mg/dL (ref >=40)
LDL CALC: 84 mg/dL (ref <130)
TRIGLYCERIDES: 207 mg/dL — AB (ref <150)
VLDL: 41 mg/dL — AB (ref <30)

## 2016-01-18 NOTE — Congregational Nurse Program (Signed)
Congregational Nurse Program Note  Date of Encounter: 12/23/2015  Past Medical History: No past medical history on file.  Encounter Details:     CNP Questionnaire - 12/23/15 1132      Patient Demographics   Is this a new or existing patient? New   Patient is considered a/an Not Applicable   Race African-American/Black     Patient Assistance   Location of Patient Assistance [place]   Patient's financial/insurance status Low Income;Medicaid;Medicare   Uninsured Patient ([employer] officer, trade unionCard/Care Connects) No   Patient referred to apply for the following financial assistance Not Applicable   Food insecurities addressed Not Applicable   Transportation assistance Yes   Type of Assistance Bus Pass Given   Assistance securing medications No   Educational health offerings Chronic disease     Encounter Details   Primary purpose of visit Acute Illness/Condition Visit;Chronic Illness/Condition Visit   Was an Emergency Department visit averted? Not Applicable   Does patient have a medical provider? Yes   Patient referred to Follow up with established PCP   Was a mental health screening completed? (GAINS tool) No   Does patient have dental issues? No   Does patient have vision issues? No   Does your patient have an abnormal blood pressure today? No   Since previous encounter, have you referred patient for abnormal blood pressure that resulted in a new diagnosis or medication change? No   Does your patient have an abnormal blood glucose today? No   Since previous encounter, have you referred patient for abnormal blood glucose that resulted in a new diagnosis or medication change? No   Was there a life-saving intervention made? No         Amb Nursing Assessment - 12/27/15 1340      Pre-visit preparation   Pre-visit preparation completed Yes     Abuse/Neglect Assessment   Do you feel unsafe in your current relationship? No   Do you feel physically threatened by others? No   Anyone hurting you  at home, work, or school? No     Patient Literacy   How often do you need to have someone help you when you read instructions, pamphlets, or other written materials from your doctor or pharmacy? 1 - Never     New client visit. Client states history of hypertension and COPD. He was recently seen for COPD exacerbation. Requests BP check. [facility].

## 2016-01-19 NOTE — Congregational Nurse Program (Signed)
Congregational Nurse Program Note  Date of Encounter: 12/26/2015  Past Medical History: No past medical history on file.  Encounter Details:     CNP Questionnaire - 12/26/15 1156      Patient Demographics   Is this a new or existing patient? Existing   Patient is considered a/an Not Applicable   Race African-American/Black     Patient Assistance   Location of Patient Assistance [facility]   Patient's financial/insurance status Medicaid;Medicare   Uninsured Patient ([employer] officer, trade unionCard/Care Connects) No   Patient referred to apply for the following financial assistance Not Applicable   Food insecurities addressed Not Applicable   Transportation assistance Yes   Type of Assistance Bus Pass Given   Assistance securing medications No   Educational health offerings Acute disease     Encounter Details   Primary purpose of visit Acute Illness/Condition Visit   Was an Emergency Department visit averted? Not Applicable   Does patient have a medical provider? Yes   Patient referred to Follow up with established PCP   Was a mental health screening completed? (GAINS tool) No   Does patient have dental issues? No   Does patient have vision issues? No   Does your patient have an abnormal blood pressure today? No   Since previous encounter, have you referred patient for abnormal blood pressure that resulted in a new diagnosis or medication change? No   Does your patient have an abnormal blood glucose today? No   Since previous encounter, have you referred patient for abnormal blood glucose that resulted in a new diagnosis or medication change? No   Was there a life-saving intervention made? No         Amb Nursing Assessment - 12/27/15 1340      Pre-visit preparation   Pre-visit preparation completed Yes     Abuse/Neglect Assessment   Do you feel unsafe in your current relationship? No   Do you feel physically threatened by others? No   Anyone hurting you at home, work, or school? No     Patient Literacy   How often do you need to have someone help you when you read instructions, pamphlets, or other written materials from your doctor or pharmacy? 1 - Never    BP check and follow up. Client is on antibiotics and steroid course. BBS CTA- no wheezing noted.

## 2016-03-08 ENCOUNTER — Inpatient Hospital Stay
Admit: 2016-03-08 | Discharge: 2016-03-08 | Disposition: A | Discharge status: Home / Self Care | Payer: MEDICAID | Attending: Emergency Medicine

## 2016-03-08 ENCOUNTER — Emergency Department: Admit: 2016-03-08 | Payer: MEDICAID | Primary: Student in an Organized Health Care Education/Training Program

## 2016-03-08 DIAGNOSIS — G43909 Migraine, unspecified, not intractable, without status migrainosus: Secondary | ICD-10-CM

## 2016-03-08 MED ORDER — AMLODIPINE 5 MG TAB
5 mg | ORAL_TABLET | Freq: Every day | ORAL | 0 refills | Status: AC
Start: 2016-03-08 — End: 2016-03-28

## 2016-03-08 MED ORDER — HYDROCHLOROTHIAZIDE 25 MG TAB
25 mg | ORAL_TABLET | Freq: Every day | ORAL | 0 refills | Status: AC
Start: 2016-03-08 — End: 2016-04-07

## 2016-03-08 MED ORDER — METOCLOPRAMIDE 10 MG TAB
10 mg | ORAL | Status: AC
Start: 2016-03-08 — End: 2016-03-08
  Administered 2016-03-08: 20:00:00 via ORAL

## 2016-03-08 MED ORDER — DIPHENHYDRAMINE 25 MG CAP
25 mg | ORAL | Status: AC
Start: 2016-03-08 — End: 2016-03-08
  Administered 2016-03-08: 20:00:00 via ORAL

## 2016-03-08 MED FILL — DIPHENHYDRAMINE 25 MG CAP: 25 mg | ORAL | Qty: 1

## 2016-03-08 MED FILL — METOCLOPRAMIDE 10 MG TAB: 10 mg | ORAL | Qty: 1

## 2016-03-08 NOTE — ED Notes (Signed)
Pt alert, oriented, stable, VSS and improved. Pt given d/c instructions, medication education and voucher for new rx x 2. Pt verbalizes understanding. Pt ambulatory with steady gait for d/c.

## 2016-03-08 NOTE — ED Triage Notes (Signed)
PT REPORTS HEADACHE X2 DAYS - PT REPORTS THAT HE HAS BEEN OUT OF HIS HTN MEDICATIONS X3 DAYS

## 2016-03-08 NOTE — ED Provider Notes (Signed)
HPI Comments: 60 yo M presents w/ c/o bifrontal HA w/ radiation x 1-2 weeks. Rates symptoms as intermittent. Denies any alleviating or exacerbating factors. Denies numbness, tingling, weakness, slurred speech, nausea, vomiting, fever, chills, neck pain, chest pain, shortness of breath.  Denies history of CVA.  States he has not been compliant with his blood pressure medications at home.    Patient is a 60 y.o. male presenting with headaches. The history is provided by the patient. No language interpreter was used.   Headache    This is a new problem. The current episode started 2 days ago. The problem occurs hourly. The problem has not changed since onset.The headache is aggravated by nothing. The pain is located in the bilateral and frontal region. The quality of the pain is described as dull. The pain is at a severity of 2/10. The pain is mild. Pertinent negatives include no anorexia, no fever, no malaise/fatigue, no chest pressure, no near-syncope, no palpitations, no syncope, no shortness of breath, no weakness, no tingling, no dizziness, no visual change, no nausea and no vomiting. He has tried nothing for the symptoms. The treatment provided no relief.        Past Medical History:   Diagnosis Date   ??? Hypertension        Past Surgical History:   Procedure Laterality Date   ??? HX APPENDECTOMY     ??? HX OTHER SURGICAL  hernia         History reviewed. No pertinent family history.    Social History     Social History   ??? Marital status: LEGALLY SEPARATED     Spouse name: N/A   ??? Number of children: N/A   ??? Years of education: N/A     Occupational History   ??? Not on file.     Social History Main Topics   ??? Smoking status: Current Every Day Smoker     Packs/day: 1.00   ??? Smokeless tobacco: Never Used   ??? Alcohol use Yes      Comment: moderately   ??? Drug use: No   ??? Sexual activity: Not on file     Other Topics Concern   ??? Not on file     Social History Narrative         ALLERGIES: Penicillins    Review of Systems    Constitutional: Negative for chills, fever and malaise/fatigue.   HENT: Negative for congestion, facial swelling and sore throat.    Eyes: Negative for photophobia and visual disturbance.   Respiratory: Negative for cough and shortness of breath.    Cardiovascular: Negative for chest pain, palpitations, leg swelling, syncope and near-syncope.   Gastrointestinal: Negative for abdominal pain, anorexia, constipation, nausea and vomiting.   Genitourinary: Negative for dysuria and hematuria.   Musculoskeletal: Negative for back pain, joint swelling, myalgias and neck pain.   Skin: Negative for rash and wound.   Neurological: Positive for headaches. Negative for dizziness, tingling, facial asymmetry, speech difficulty, weakness, light-headedness and numbness.   Psychiatric/Behavioral: Negative for confusion.       Vitals:    03/08/16 1348   BP: 144/81   Pulse: (!) 107   Resp: 20   Temp: 97.9 ??F (36.6 ??C)   SpO2: 96%   Weight: 122.5 kg (270 lb)   Height: 5\' 9"  (1.753 m)            Physical Exam   Constitutional: He is oriented to person, place, and time. He appears  well-developed and well-nourished.   HENT:   Head: Normocephalic.   Mouth/Throat: Oropharynx is clear and moist.   Eyes: Conjunctivae and EOM are normal. Pupils are equal, round, and reactive to light.   Neck: Normal range of motion. No JVD present. No tracheal deviation present.   Cardiovascular: Normal rate, regular rhythm, normal heart sounds and intact distal pulses.    No murmur heard.  Pulmonary/Chest: Effort normal and breath sounds normal. No respiratory distress. He has no wheezes. He has no rales. He exhibits no tenderness.   Abdominal: Soft. Bowel sounds are normal. He exhibits no distension. There is no tenderness. There is no rebound.   Musculoskeletal: Normal range of motion. He exhibits no edema, tenderness or deformity.   Neurological: He is alert and oriented to person, place, and time. No cranial nerve deficit. Coordination normal.    No meningismus.  No nuchal rigidity.  Strength 5 out of 5 throughout. Normal sensory exam.  No slurred speech.  No facial droop.   Skin: Skin is warm and dry.   Nursing note and vitals reviewed.       MDM  Number of Diagnoses or Management Options  Essential hypertension: new and requires workup  Migraine without status migrainosus, not intractable, unspecified migraine type: new and requires workup  Diagnosis management comments: CT findings mentioned below.  Radiologist contacted.  States findings nonspecific.  Patient states he needs prescription for blood pressure medicines.   Case manager consulted.        Amount and/or Complexity of Data Reviewed  Tests in the radiology section of CPT??: ordered and reviewed  Tests in the medicine section of CPT??: reviewed and ordered    Risk of Complications, Morbidity, and/or Mortality  Presenting problems: low  Diagnostic procedures: low  Management options: low    Patient Progress  Patient progress: stable    ED Course   Comment By Time   CT head IMPRESSION: ??Mild asymmetric hypodensity in the left frontal lobe periventricular white matter, this may represent microvascular disease. However if clinical concern for acute stroke, MRI of the brain stroke protocol without  contrast may be more sensitive in evaluation. Karey Suthers Steger Sheral FlowFlowers Jr., MD 12/28 1535   CT with non-specific findings. No focal weakness, slurred speech, facial droop. States he is ready for discharge home. Kian Gamarra Steger Sheral FlowFlowers Jr., MD 12/28 1542       Procedures

## 2016-03-08 NOTE — ED Notes (Signed)
Pt stable to CT.

## 2016-03-08 NOTE — Progress Notes (Signed)
Met with patient in the ER, per MD request.  Patient states he is from [facility] and all of his doctors are there.  He plans going back there so declines assistance in getting PCP established here.  Patient states he just needs assistance with getting his medications / no money and has [facility].  Medication voucher completed (total $21).

## 2016-04-10 ENCOUNTER — Emergency Department: Admit: 2016-04-11 | Payer: MEDICAID | Primary: Student in an Organized Health Care Education/Training Program

## 2016-04-10 ENCOUNTER — Inpatient Hospital Stay
Admit: 2016-04-10 | Discharge: 2016-04-11 | Disposition: A | Discharge status: Home / Self Care | Payer: MEDICAID | Attending: Emergency Medicine

## 2016-04-10 DIAGNOSIS — J9601 Acute respiratory failure with hypoxia: Secondary | ICD-10-CM

## 2016-04-10 NOTE — ED Triage Notes (Signed)
PT arrived to ED c/o  SOB for the past 3 days PT has a Hx of COPD.

## 2016-04-10 NOTE — ED Provider Notes (Signed)
HPI Comments: Patient is a 61 yo male with history of COPD who presents with cough, runny nose, congestion and SOB.  States symptoms for the past 3 days, states associated with fevers and chills intermittently.  No nausea or vomiting, no chest pain, no abdominal pain, no further complaints.    Patient is a 61 y.o. male presenting with shortness of breath. The history is provided by the patient. No language interpreter was used.   Shortness of Breath   Associated symptoms include rhinorrhea and cough. Pertinent negatives include no fever, no headaches, no sore throat, no neck pain, no chest pain, no vomiting, no abdominal pain, no rash and no leg swelling.        Past Medical History:   Diagnosis Date   ??? Hypertension        Past Surgical History:   Procedure Laterality Date   ??? HX APPENDECTOMY     ??? HX OTHER SURGICAL  hernia         No family history on file.    Social History     Social History   ??? Marital status: LEGALLY SEPARATED     Spouse name: N/A   ??? Number of children: N/A   ??? Years of education: N/A     Occupational History   ??? Not on file.     Social History Main Topics   ??? Smoking status: Current Every Day Smoker     Packs/day: 1.00   ??? Smokeless tobacco: Never Used   ??? Alcohol use Yes      Comment: moderately   ??? Drug use: No   ??? Sexual activity: Not on file     Other Topics Concern   ??? Not on file     Social History Narrative         ALLERGIES: Penicillins    Review of Systems   Constitutional: Negative for chills and fever.   HENT: Positive for congestion, postnasal drip and rhinorrhea. Negative for sore throat.    Eyes: Negative for visual disturbance.   Respiratory: Positive for cough and shortness of breath.    Cardiovascular: Negative for chest pain and leg swelling.   Gastrointestinal: Negative for abdominal pain, diarrhea, nausea and vomiting.   Genitourinary: Negative for dysuria.   Musculoskeletal: Negative for back pain and neck pain.   Skin: Negative for rash.    Neurological: Negative for weakness and headaches.   Psychiatric/Behavioral: The patient is not nervous/anxious.        Vitals:    04/10/16 1933   BP: 128/77   Pulse: 75   Resp: 22   Temp: 98.5 ??F (36.9 ??C)   SpO2: 95%   Weight: 122.5 kg (270 lb)   Height: 5' 9"  (1.753 m)            Physical Exam   Constitutional: He is oriented to person, place, and time. He appears well-developed and well-nourished. No distress.   HENT:   Head: Normocephalic.   Right Ear: External ear normal.   Left Ear: External ear normal.   Eyes: Conjunctivae and EOM are normal. Pupils are equal, round, and reactive to light. No scleral icterus.   Neck: Normal range of motion. Neck supple. No tracheal deviation present.   Cardiovascular: Normal rate, regular rhythm, normal heart sounds and intact distal pulses.    No murmur heard.  Pulmonary/Chest: No respiratory distress. He has wheezes.   Abdominal: Soft. Bowel sounds are normal. There is no tenderness.   Musculoskeletal: Normal  range of motion. He exhibits no tenderness.   Neurological: He is alert and oriented to person, place, and time. No cranial nerve deficit.   Skin: Skin is warm and dry. No rash noted.   Psychiatric: He has a normal mood and affect.   Nursing note and vitals reviewed.       MDM  Number of Diagnoses or Management Options  SOB (shortness of breath): new and requires workup     Amount and/or Complexity of Data Reviewed  Clinical lab tests: ordered and reviewed  Tests in the radiology section of CPT??: ordered and reviewed  Tests in the medicine section of CPT??: ordered and reviewed  Review and summarize past medical records: yes    Risk of Complications, Morbidity, and/or Mortality  Presenting problems: high  Diagnostic procedures: high  Management options: high    Patient Progress  Patient progress: stable        ED Course       Procedures    Recent Results (from the past 12 hour(s))   EKG, 12 LEAD, INITIAL    Collection Time: 04/10/16  7:36 PM   Result Value Ref Range     Ventricular Rate 95 BPM    Atrial Rate 95 BPM    P-R Interval 140 ms    QRS Duration 82 ms    Q-T Interval 396 ms    QTC Calculation (Bezet) 497 ms    Calculated P Axis 37 degrees    Calculated R Axis 58 degrees    Calculated T Axis 34 degrees    Diagnosis       Sinus rhythm with Blocked Premature atrial complexes  Possible Lateral infarct , age undetermined  Abnormal ECG  No previous ECGs available     CBC WITH AUTOMATED DIFF    Collection Time: 04/10/16  7:41 PM   Result Value Ref Range    WBC 4.9 4.3 - 11.1 K/uL    RBC 4.66 4.23 - 5.67 M/uL    HGB 13.8 13.6 - 17.2 g/dL    HCT 40.7 (L) 41.1 - 50.3 %    MCV 87.3 79.6 - 97.8 FL    MCH 29.6 26.1 - 32.9 PG    MCHC 33.9 31.4 - 35.0 g/dL    RDW 15.8 (H) 11.9 - 14.6 %    PLATELET 181 150 - 450 K/uL    MPV 11.6 10.8 - 14.1 FL    DF AUTOMATED      NEUTROPHILS 62 43 - 78 %    LYMPHOCYTES 22 13 - 44 %    MONOCYTES 16 (H) 4.0 - 12.0 %    EOSINOPHILS 0 (L) 0.5 - 7.8 %    BASOPHILS 0 0.0 - 2.0 %    IMMATURE GRANULOCYTES 0 0.0 - 5.0 %    ABS. NEUTROPHILS 3.0 1.7 - 8.2 K/UL    ABS. LYMPHOCYTES 1.1 0.5 - 4.6 K/UL    ABS. MONOCYTES 0.8 0.1 - 1.3 K/UL    ABS. EOSINOPHILS 0.0 0.0 - 0.8 K/UL    ABS. BASOPHILS 0.0 0.0 - 0.2 K/UL    ABS. IMM. GRANS. 0.0 0.0 - 0.5 K/UL   METABOLIC PANEL, COMPREHENSIVE    Collection Time: 04/10/16  7:41 PM   Result Value Ref Range    Sodium 137 136 - 145 mmol/L    Potassium 2.7 (LL) 3.5 - 5.1 mmol/L    Chloride 102 98 - 107 mmol/L    CO2 24 21 - 32 mmol/L    Anion  gap 11 7 - 16 mmol/L    Glucose 96 65 - 100 mg/dL    BUN 9 8 - 23 MG/DL    Creatinine 1.03 0.8 - 1.5 MG/DL    GFR est AA >60 >60 ml/min/1.26m    GFR est non-AA >60 >60 ml/min/1.770m   Calcium 8.5 8.3 - 10.4 MG/DL    Bilirubin, total 0.5 0.2 - 1.1 MG/DL    ALT (SGPT) 25 12 - 65 U/L    AST (SGOT) 44 (H) 15 - 37 U/L    Alk. phosphatase 62 50 - 136 U/L    Protein, total 7.2 6.3 - 8.2 g/dL    Albumin 3.4 3.2 - 4.6 g/dL    Globulin 3.8 (H) 2.3 - 3.5 g/dL    A-G Ratio 0.9 (L) 1.2 - 3.5        Xr Chest Pa Lat    Result Date: 04/10/2016  AP LATERAL CHEST X-RAY HISTORY: Shortness of breath x3 days COMPARISON: None FINDINGS: The cardiac silhouette appears enlarged. There is no consolidation, pleural effusions, or pulmonary edema. Multilevel thoracic spondylosis is present.     IMPRESSION: No consolidation.      [age]o male with COPD exacerbation:     Patient hypoxic on ambulation, requiring multiple breathing treatments, admit hospitalist. WIll start tamiflu

## 2016-04-10 NOTE — ED Notes (Signed)
Verbal report given to Kayla, RN for continuation of care.

## 2016-04-11 LAB — METABOLIC PANEL, COMPREHENSIVE
A-G Ratio: 0.9 — ABNORMAL LOW (ref 1.2–3.5)
ALT (SGPT): 25 U/L (ref 12–65)
AST (SGOT): 44 U/L — ABNORMAL HIGH (ref 15–37)
Albumin: 3.4 g/dL (ref 3.2–4.6)
Alk. phosphatase: 62 U/L (ref 50–136)
Anion gap: 11 mmol/L (ref 7–16)
BUN: 9 MG/DL (ref 8–23)
Bilirubin, total: 0.5 MG/DL (ref 0.2–1.1)
CO2: 24 mmol/L (ref 21–32)
Calcium: 8.5 MG/DL (ref 8.3–10.4)
Chloride: 102 mmol/L (ref 98–107)
Creatinine: 1.03 MG/DL (ref 0.8–1.5)
GFR est AA: >60 mL/min/{1.73_m2} (ref >60)
GFR est non-AA: >60 mL/min/{1.73_m2} (ref >60)
Globulin: 3.8 g/dL — ABNORMAL HIGH (ref 2.3–3.5)
Glucose: 96 mg/dL (ref 65–100)
Potassium: 2.7 mmol/L — CL (ref 3.5–5.1)
Protein, total: 7.2 g/dL (ref 6.3–8.2)
Sodium: 137 mmol/L (ref 136–145)

## 2016-04-11 LAB — CBC WITH AUTOMATED DIFF
ABS. BASOPHILS: 0 10*3/uL (ref 0.0–0.2)
ABS. EOSINOPHILS: 0 10*3/uL (ref 0.0–0.8)
ABS. IMM. GRANS.: 0 10*3/uL (ref 0.0–0.5)
ABS. LYMPHOCYTES: 1.1 10*3/uL (ref 0.5–4.6)
ABS. MONOCYTES: 0.8 10*3/uL (ref 0.1–1.3)
ABS. NEUTROPHILS: 3 10*3/uL (ref 1.7–8.2)
BASOPHILS: 0 % (ref 0.0–2.0)
EOSINOPHILS: 0 % — ABNORMAL LOW (ref 0.5–7.8)
HCT: 40.7 % — ABNORMAL LOW (ref 41.1–50.3)
HGB: 13.8 g/dL (ref 13.6–17.2)
IMMATURE GRANULOCYTES: 0 % (ref 0.0–5.0)
LYMPHOCYTES: 22 % (ref 13–44)
MCH: 29.6 PG (ref 26.1–32.9)
MCHC: 33.9 g/dL (ref 31.4–35.0)
MCV: 87.3 FL (ref 79.6–97.8)
MONOCYTES: 16 % — ABNORMAL HIGH (ref 4.0–12.0)
MPV: 11.6 FL (ref 10.8–14.1)
NEUTROPHILS: 62 % (ref 43–78)
PLATELET: 181 10*3/uL (ref 150–450)
RBC: 4.66 M/uL (ref 4.23–5.67)
RDW: 15.8 % — ABNORMAL HIGH (ref 11.9–14.6)
WBC: 4.9 10*3/uL (ref 4.3–11.1)

## 2016-04-11 LAB — EKG, 12 LEAD, INITIAL
Atrial Rate: 95 {beats}/min
Calculated P Axis: 37 degrees
Calculated R Axis: 58 degrees
Calculated T Axis: 34 degrees
P-R Interval: 140 ms
Q-T Interval: 396 ms
QRS Duration: 82 ms
QTC Calculation (Bezet): 497 ms
Ventricular Rate: 95 {beats}/min

## 2016-04-11 LAB — TROPONIN I: Troponin-I, Qt.: 0.02 NG/ML (ref 0.02–0.05)

## 2016-04-11 LAB — INFLUENZA A & B AG (RAPID TEST)
Influenza A Ag: POSITIVE — AB
Influenza B Ag: NEGATIVE

## 2016-04-11 LAB — EKG 12-LEAD
Atrial Rate: 95 {beats}/min
P Axis: 37 degrees
P-R Interval: 140 ms
Q-T Interval: 396 ms
QRS Duration: 82 ms
QTc Calculation (Bazett): 497 ms
R Axis: 58 degrees
T Axis: 34 degrees
Ventricular Rate: 95 {beats}/min

## 2016-04-11 MED ORDER — OSELTAMIVIR PHOSPHATE 75 MG CAP
75 mg | Freq: Two times a day (BID) | ORAL | Status: DC
Start: 2016-04-11 — End: 2016-04-11
  Administered 2016-04-11: 07:00:00 via ORAL

## 2016-04-11 MED ORDER — MAGNESIUM OXIDE 400 MG TAB
400 mg | ORAL_TABLET | Freq: Two times a day (BID) | ORAL | 0 refills | Status: AC
Start: 2016-04-11 — End: ?

## 2016-04-11 MED ORDER — POTASSIUM CHLORIDE SR 20 MEQ TAB, PARTICLES/CRYSTALS
20 mEq | ORAL_TABLET | Freq: Every day | ORAL | 0 refills | Status: AC
Start: 2016-04-11 — End: 2016-04-18

## 2016-04-11 MED ORDER — SENNOSIDES-DOCUSATE SODIUM 8.6 MG-50 MG TAB
Freq: Every day | ORAL | Status: DC | PRN
Start: 2016-04-11 — End: 2016-04-11

## 2016-04-11 MED ORDER — BISACODYL 5 MG TAB, DELAYED RELEASE
5 mg | Freq: Every day | ORAL | Status: DC | PRN
Start: 2016-04-11 — End: 2016-04-11

## 2016-04-11 MED ORDER — SODIUM CHLORIDE 0.9 % IJ SYRG
INTRAMUSCULAR | Status: DC | PRN
Start: 2016-04-11 — End: 2016-04-11

## 2016-04-11 MED ORDER — ALBUTEROL SULFATE 2.5 MG/0.5 ML NEB SOLUTION
2.5 mg/0.5 mL | RESPIRATORY_TRACT | Status: AC
Start: 2016-04-11 — End: 2016-04-11
  Administered 2016-04-11: 07:00:00

## 2016-04-11 MED ORDER — MIRTAZAPINE 15 MG TAB
15 mg | Freq: Every evening | ORAL | Status: DC | PRN
Start: 2016-04-11 — End: 2016-04-11

## 2016-04-11 MED ORDER — IPRATROPIUM-ALBUTEROL 2.5 MG-0.5 MG/3 ML NEB SOLUTION
2.5 mg-0.5 mg/3 ml | RESPIRATORY_TRACT | Status: DC | PRN
Start: 2016-04-11 — End: 2016-04-11

## 2016-04-11 MED ORDER — POTASSIUM CHLORIDE SR 20 MEQ TAB, PARTICLES/CRYSTALS
20 mEq | ORAL_TABLET | Freq: Every day | ORAL | 0 refills | Status: DC
Start: 2016-04-11 — End: 2016-04-11

## 2016-04-11 MED ORDER — POTASSIUM CHLORIDE SR 20 MEQ TAB, PARTICLES/CRYSTALS
20 mEq | ORAL | Status: AC
Start: 2016-04-11 — End: 2016-04-10
  Administered 2016-04-11: 04:00:00 via ORAL

## 2016-04-11 MED ORDER — ACETAMINOPHEN 325 MG TABLET
325 mg | ORAL | Status: DC | PRN
Start: 2016-04-11 — End: 2016-04-11

## 2016-04-11 MED ORDER — ONDANSETRON (PF) 4 MG/2 ML INJECTION
4 mg/2 mL | INTRAMUSCULAR | Status: DC | PRN
Start: 2016-04-11 — End: 2016-04-11

## 2016-04-11 MED ORDER — NALOXONE 0.4 MG/ML INJECTION
0.4 mg/mL | INTRAMUSCULAR | Status: DC | PRN
Start: 2016-04-11 — End: 2016-04-11

## 2016-04-11 MED ORDER — ENOXAPARIN 40 MG/0.4 ML SUB-Q SYRINGE
40 mg/0.4 mL | SUBCUTANEOUS | Status: DC
Start: 2016-04-11 — End: 2016-04-11

## 2016-04-11 MED ORDER — POTASSIUM CHLORIDE 20 MEQ/100 ML IV PIGGY BACK
20 mEq/100 mL | INTRAVENOUS | Status: AC
Start: 2016-04-11 — End: 2016-04-11
  Administered 2016-04-11: 05:00:00 via INTRAVENOUS

## 2016-04-11 MED ORDER — SODIUM CHLORIDE 0.9 % IJ SYRG
Freq: Three times a day (TID) | INTRAMUSCULAR | Status: DC
Start: 2016-04-11 — End: 2016-04-11

## 2016-04-11 MED ORDER — DEXTROMETHORPHAN-GUAIFENESIN 10 MG-100 MG/5 ML SYRUP
100-10 mg/5 mL | ORAL | Status: DC | PRN
Start: 2016-04-11 — End: 2016-04-11

## 2016-04-11 MED ORDER — MAGNESIUM OXIDE 400 MG TAB
400 mg | ORAL_TABLET | Freq: Two times a day (BID) | ORAL | 0 refills | Status: DC
Start: 2016-04-11 — End: 2016-04-11

## 2016-04-11 MED ORDER — PROMETHAZINE 25 MG/ML INJECTION
25 mg/mL | Freq: Four times a day (QID) | INTRAMUSCULAR | Status: DC | PRN
Start: 2016-04-11 — End: 2016-04-11

## 2016-04-11 MED ORDER — METHYLPREDNISOLONE (PF) 125 MG/2 ML IJ SOLR
125 mg/2 mL | Freq: Once | INTRAMUSCULAR | Status: AC
Start: 2016-04-11 — End: 2016-04-10
  Administered 2016-04-11: 04:00:00 via INTRAVENOUS

## 2016-04-11 MED ORDER — HYDROCODONE-ACETAMINOPHEN 5 MG-325 MG TAB
5-325 mg | ORAL | Status: DC | PRN
Start: 2016-04-11 — End: 2016-04-11

## 2016-04-11 MED ORDER — ALBUTEROL SULFATE 0.083 % (0.83 MG/ML) SOLN FOR INHALATION
2.5 mg /3 mL (0.083 %) | RESPIRATORY_TRACT | Status: AC
Start: 2016-04-11 — End: 2016-04-10
  Administered 2016-04-11: 04:00:00 via RESPIRATORY_TRACT

## 2016-04-11 MED ORDER — POLYETHYLENE GLYCOL 3350 17 GRAM (100 %) ORAL POWDER PACKET
17 gram | Freq: Every day | ORAL | Status: DC | PRN
Start: 2016-04-11 — End: 2016-04-11

## 2016-04-11 MED ORDER — CLONIDINE 0.1 MG TAB
0.1 mg | Freq: Two times a day (BID) | ORAL | Status: DC | PRN
Start: 2016-04-11 — End: 2016-04-11

## 2016-04-11 MED ORDER — HEPARIN (PORCINE) 5,000 UNIT/ML IJ SOLN
5000 unit/mL | Freq: Three times a day (TID) | INTRAMUSCULAR | Status: DC
Start: 2016-04-11 — End: 2016-04-11
  Administered 2016-04-11: 07:00:00 via SUBCUTANEOUS

## 2016-04-11 MED ORDER — ALBUTEROL SULFATE 0.083 % (0.83 MG/ML) SOLN FOR INHALATION
2.5 mg /3 mL (0.083 %) | RESPIRATORY_TRACT | Status: AC
Start: 2016-04-11 — End: 2016-04-11
  Administered 2016-04-11: 06:00:00 via RESPIRATORY_TRACT

## 2016-04-11 MED ORDER — ENOXAPARIN 40 MG/0.4 ML SUB-Q SYRINGE
40 mg/0.4 mL | SUBCUTANEOUS | Status: DC
Start: 2016-04-11 — End: 2016-04-11
  Administered 2016-04-11: 16:00:00 via SUBCUTANEOUS

## 2016-04-11 MED ORDER — DIPHENHYDRAMINE HCL 50 MG/ML IJ SOLN
50 mg/mL | INTRAMUSCULAR | Status: DC | PRN
Start: 2016-04-11 — End: 2016-04-11

## 2016-04-11 MED ORDER — ALBUTEROL SULFATE 0.083 % (0.83 MG/ML) SOLN FOR INHALATION
2.5 mg /3 mL (0.083 %) | RESPIRATORY_TRACT | Status: DC | PRN
Start: 2016-04-11 — End: 2016-04-11

## 2016-04-11 MED ORDER — LORAZEPAM 1 MG TAB
1 mg | ORAL | Status: DC | PRN
Start: 2016-04-11 — End: 2016-04-11

## 2016-04-11 MED ORDER — IPRATROPIUM BROMIDE 0.02 % SOLN FOR INHALATION
0.02 % | RESPIRATORY_TRACT | Status: AC
Start: 2016-04-11 — End: 2016-04-10
  Administered 2016-04-11: 04:00:00 via RESPIRATORY_TRACT

## 2016-04-11 MED ORDER — ZOLPIDEM 5 MG TAB
5 mg | Freq: Every evening | ORAL | Status: DC | PRN
Start: 2016-04-11 — End: 2016-04-11

## 2016-04-11 MED ORDER — POTASSIUM CHLORIDE SR 20 MEQ TAB, PARTICLES/CRYSTALS
20 mEq | Freq: Two times a day (BID) | ORAL | Status: DC
Start: 2016-04-11 — End: 2016-04-11
  Administered 2016-04-11: 16:00:00 via ORAL

## 2016-04-11 MED ORDER — ALUM-MAG HYDROXIDE-SIMETH 200 MG-200 MG-20 MG/5 ML ORAL SUSP
200-200-20 mg/5 mL | ORAL | Status: DC | PRN
Start: 2016-04-11 — End: 2016-04-11

## 2016-04-11 MED ORDER — HYDROCODONE-HOMATROPINE 5 MG-1.5 MG/5 ML (5 ML) ORAL SOLUTION
ORAL | Status: DC | PRN
Start: 2016-04-11 — End: 2016-04-11

## 2016-04-11 MED ORDER — HYDRALAZINE 20 MG/ML IJ SOLN
20 mg/mL | Freq: Four times a day (QID) | INTRAMUSCULAR | Status: DC | PRN
Start: 2016-04-11 — End: 2016-04-11

## 2016-04-11 MED FILL — POTASSIUM CHLORIDE SR 20 MEQ TAB, PARTICLES/CRYSTALS: 20 mEq | ORAL | Qty: 2

## 2016-04-11 MED FILL — LOVENOX 40 MG/0.4 ML SUBCUTANEOUS SYRINGE: 40 mg/0.4 mL | SUBCUTANEOUS | Qty: 0.4

## 2016-04-11 MED FILL — OSELTAMIVIR PHOSPHATE 75 MG CAP: 75 mg | ORAL | Qty: 1

## 2016-04-11 MED FILL — POTASSIUM CHLORIDE 20 MEQ/100 ML IV PIGGY BACK: 20 mEq/100 mL | INTRAVENOUS | Qty: 100

## 2016-04-11 MED FILL — HEPARIN (PORCINE) 5,000 UNIT/ML IJ SOLN: 5000 unit/mL | INTRAMUSCULAR | Qty: 1

## 2016-04-11 MED FILL — IPRATROPIUM BROMIDE 0.02 % SOLN FOR INHALATION: 0.02 % | RESPIRATORY_TRACT | Qty: 2.5

## 2016-04-11 MED FILL — SOLU-MEDROL (PF) 125 MG/2 ML SOLUTION FOR INJECTION: 125 mg/2 mL | INTRAMUSCULAR | Qty: 2

## 2016-04-11 MED FILL — ALBUTEROL SULFATE 0.083 % (0.83 MG/ML) SOLN FOR INHALATION: 2.5 mg /3 mL (0.083 %) | RESPIRATORY_TRACT | Qty: 2

## 2016-04-11 MED FILL — ALBUTEROL SULFATE 2.5 MG/0.5 ML NEB SOLUTION: 2.5 mg/0.5 mL | RESPIRATORY_TRACT | Qty: 1

## 2016-04-11 NOTE — ED Notes (Signed)
Pt unable to tolerate IV potassium.  Dr Ivy Lynnrumpler notified

## 2016-04-11 NOTE — Discharge Summary (Signed)
Physician Discharge Summary     Patient ID:  Cody Solis  [id number]  61 y.o.  09/27/1955    Admit date: 04/10/2016    Discharge date: 04-11-2016    Diagnosis:  1- Influenza A, 4 days duration of symptoms, stable  2- Acute hypoxic respiratory failure, hx of COPD, resolved, O2 sat 93% walking and > 93% at rest before discharge  3- Hypokalemia, K 2.7, replaced, significant    Hospital course:   61 y/o M who presented to ER with 4 days of myalgias, arthralgias, HA, nausea, fevers, nonproductive cough, runny nose, SOB.  No CP, palpitations, abd pain, vomiting, diarrhea, urinary symptoms.  Flu positive in ER.  Reports sick contact.  Hx COPD  Otherwise CBC, coagulation profile, CMP, Chest X-ray and EKG were unremarkable.  Patient admitted and treated as above.  On day of discharge, patient exam showed clear but diminished breath sounds. Pt did well on room air.    PCP: PROVIDER UNKNOWN    Medications:  KCL po 40 mEq daily for 7 days  Magnesium oxide 400 mg bid for 15 days    Patient Instructions:   There are no discharge medications for this patient.    Instructions:     PCP in 2 weeks- no Tobacco- low salt low fat diet- activity as tolerated- monitor blood pressure at home, goal < 135/80- check BMP and Mg in 1 week    Condition: Stable    Follow-up with primary physician in 1-2 week.  Time 35 min    Please send copy to primary physician.    Signed:  Sheliah HatchVictor Lorriann Hansmann, MD  04/11/2016  12:56 PM

## 2016-04-11 NOTE — ED Notes (Signed)
Report to Nathan, RN for continuation of care.

## 2016-04-11 NOTE — Progress Notes (Signed)
Chaplain made initial visit.  Pt was alert and verbal.  No pain level was expressed or observed.  Chaplain welcomed pt to [facility] and shared about chaplain services.  Chaplain provided spiritual care through presence, pastoral conversation and assurance of prayer.

## 2016-04-11 NOTE — ED Notes (Signed)
Per Dr. Ivy Lynnrumpler, this RN ambulated pt on RA and his o2 sat dropped to 88% and pt became sob.  Dr. Ivy Lynnrumpler notified.

## 2016-04-11 NOTE — H&P (Signed)
Hospitalist H&P Note     Admit Date:  04/10/2016  9:34 PM   Name:  Cody Solis   Age:  61 y.o.  DOB:  1955-09-30   MRN:  [id number]   PCP:  PROVIDER UNKNOWN  Treatment Team: Attending Provider: Olene Craven, MD; Primary Nurse: Leighton Roach, RN    HPI:   Pt is a 61 y/o M who presented to ER with 4 days of myalgias, arthralgias, HA, nausea, fevers, nonproductive cough, runny nose, SOB.  No CP, palpitations, abd pain, vomiting, diarrhea, urinary symptoms.  Flu positive in ER.  Reports sick contact.  Hx COPD    10 systems reviewed and negative except as noted in HPI.  Past Medical History:   Diagnosis Date   ??? Hypertension       Past Surgical History:   Procedure Laterality Date   ??? HX APPENDECTOMY     ??? HX OTHER SURGICAL  hernia      Allergies   Allergen Reactions   ??? Penicillins Swelling      Social History   Substance Use Topics   ??? Smoking status: Current Every Day Smoker     Packs/day: 1.00   ??? Smokeless tobacco: Never Used   ??? Alcohol use Yes      Comment: moderately      No family history on file.     There is no immunization history on file for this patient.  PTA Medications:  None       Objective:     Patient Vitals for the past 24 hrs:   Temp Pulse Resp BP SpO2   04/11/16 0209 - - - 151/73 -   04/11/16 0149 - - - 119/62 97 %   04/11/16 0129 - - - 143/74 96 %   04/11/16 0100 - - - - 92 %   04/11/16 0030 - - - - 94 %   04/10/16 2359 - - - - 94 %   04/10/16 2333 - - - - 95 %   04/10/16 2301 - - - - 97 %   04/10/16 2258 - - - - 91 %   04/10/16 2225 - 78 - (!) 156/92 92 %   04/10/16 2156 - 82 - (!) 148/97 92 %   04/10/16 1933 98.5 ??F (36.9 ??C) 75 22 128/77 95 %     Oxygen Therapy  O2 Sat (%): 97 % (04/11/16 0149)  Pulse via Oximetry: 87 beats per minute (04/11/16 0149)  O2 Device: Nasal cannula (04/11/16 0149)  O2 Flow Rate (L/min): 3 l/min (04/11/16 0149)  No intake or output data in the 24 hours ending 04/11/16 0227    Physical Exam:  General:    Well nourished.  Alert.     Eyes:   Normal sclera.  Extraocular movements intact.  ENT:  Normocephalic, atraumatic.  Moist mucous membranes  CV:   RRR.  No m/r/g.  Peripheral pulses 2+. Capillary refill <2s.  Lungs:  CTAB.  No wheezing, rhonchi, or rales.  Abdomen: Soft, nontender, nondistended. Bowel sounds normal.   Extremities: Warm and dry.  No cyanosis or edema.  Neurologic: CN II-XII grossly intact.  Sensation intact.  Skin:     No rashes or jaundice.  Normal coloration  Psych:  Normal mood and affect.    I reviewed the labs, imaging, EKGs, telemetry, and other studies done this admission.  Data Review:   Recent Results (from the past 24 hour(s))   EKG, 12 LEAD, INITIAL  Collection Time: 04/10/16  7:36 PM   Result Value Ref Range    Ventricular Rate 95 BPM    Atrial Rate 95 BPM    P-R Interval 140 ms    QRS Duration 82 ms    Q-T Interval 396 ms    QTC Calculation (Bezet) 497 ms    Calculated P Axis 37 degrees    Calculated R Axis 58 degrees    Calculated T Axis 34 degrees    Diagnosis       Sinus rhythm with Blocked Premature atrial complexes  Possible Lateral infarct , age undetermined  Abnormal ECG  No previous ECGs available     CBC WITH AUTOMATED DIFF    Collection Time: 04/10/16  7:41 PM   Result Value Ref Range    WBC 4.9 4.3 - 11.1 K/uL    RBC 4.66 4.23 - 5.67 M/uL    HGB 13.8 13.6 - 17.2 g/dL    HCT 40.7 (L) 41.1 - 50.3 %    MCV 87.3 79.6 - 97.8 FL    MCH 29.6 26.1 - 32.9 PG    MCHC 33.9 31.4 - 35.0 g/dL    RDW 15.8 (H) 11.9 - 14.6 %    PLATELET 181 150 - 450 K/uL    MPV 11.6 10.8 - 14.1 FL    DF AUTOMATED      NEUTROPHILS 62 43 - 78 %    LYMPHOCYTES 22 13 - 44 %    MONOCYTES 16 (H) 4.0 - 12.0 %    EOSINOPHILS 0 (L) 0.5 - 7.8 %    BASOPHILS 0 0.0 - 2.0 %    IMMATURE GRANULOCYTES 0 0.0 - 5.0 %    ABS. NEUTROPHILS 3.0 1.7 - 8.2 K/UL    ABS. LYMPHOCYTES 1.1 0.5 - 4.6 K/UL    ABS. MONOCYTES 0.8 0.1 - 1.3 K/UL    ABS. EOSINOPHILS 0.0 0.0 - 0.8 K/UL    ABS. BASOPHILS 0.0 0.0 - 0.2 K/UL    ABS. IMM. GRANS. 0.0 0.0 - 0.5 K/UL    METABOLIC PANEL, COMPREHENSIVE    Collection Time: 04/10/16  7:41 PM   Result Value Ref Range    Sodium 137 136 - 145 mmol/L    Potassium 2.7 (LL) 3.5 - 5.1 mmol/L    Chloride 102 98 - 107 mmol/L    CO2 24 21 - 32 mmol/L    Anion gap 11 7 - 16 mmol/L    Glucose 96 65 - 100 mg/dL    BUN 9 8 - 23 MG/DL    Creatinine 1.03 0.8 - 1.5 MG/DL    GFR est AA >60 >60 ml/min/1.53m    GFR est non-AA >60 >60 ml/min/1.72m   Calcium 8.5 8.3 - 10.4 MG/DL    Bilirubin, total 0.5 0.2 - 1.1 MG/DL    ALT (SGPT) 25 12 - 65 U/L    AST (SGOT) 44 (H) 15 - 37 U/L    Alk. phosphatase 62 50 - 136 U/L    Protein, total 7.2 6.3 - 8.2 g/dL    Albumin 3.4 3.2 - 4.6 g/dL    Globulin 3.8 (H) 2.3 - 3.5 g/dL    A-G Ratio 0.9 (L) 1.2 - 3.5     TROPONIN I    Collection Time: 04/10/16  7:41 PM   Result Value Ref Range    Troponin-I, Qt. 0.02 0.02 - 0.05 NG/ML   INFLUENZA A & B AG (RAPID TEST)    Collection Time: 04/10/16 10:57  PM   Result Value Ref Range    Influenza A Ag POSITIVE (A) NEG      Influenza B Ag NEGATIVE  NEG         All Micro Results     Procedure Component Value Units Date/Time    INFLUENZA A & B AG (RAPID TEST) [[id number]]  (Abnormal) Collected:  04/10/16 2257    Order Status:  Completed Specimen:  Nasopharyngeal from Nasal washing Updated:  04/10/16 2338     Influenza A Ag POSITIVE (A)         A POSITIVE RESULT MAY OCCUR IN THE ABSENCE OF VIABLE VIRUS        Influenza B Ag NEGATIVE          NEGATIVE FOR THE PRESENCE OF INFLUENZA B ANTIGEN  INFECTION DUE TO INFLUENZA B CANNOT BE RULED OUT.  BECAUSE THE ANTIGEN PRESENT IN THE SAMPLE MAY BE BELOW  THE DETECTION LIMIT OF THE TEST.  A NEGATIVE TEST IS PRESUMPTIVE AND IT IS RECOMMENDED THAT THESE RESULTS BE CONFIRMED BY VIRAL CULTURE OR AN FDA-CLEARED INFLUENZA A AND B MOLECULAR ASSAY.               Other Studies:  Xr Chest Pa Lat    Result Date: 04/10/2016  AP LATERAL CHEST X-RAY HISTORY: Shortness of breath x3 days COMPARISON:  None FINDINGS: The cardiac silhouette appears enlarged. There is no consolidation, pleural effusions, or pulmonary edema. Multilevel thoracic spondylosis is present.     IMPRESSION: No consolidation.      Assessment and Plan:     Hospital Problems as of 04/11/2016  Never Reviewed          Codes Class Noted - Resolved POA    * (Principal)Acute respiratory failure with hypoxia ([place]) ICD-10-CM: J96.01  ICD-9-CM: 518.81  04/11/2016 - Present Yes        Influenza A ICD-10-CM: J10.1  ICD-9-CM: 487.1  04/11/2016 - Present Yes        COPD (chronic obstructive pulmonary disease) ([place]) (Chronic) ICD-10-CM: J44.9  ICD-9-CM: 496  04/11/2016 - Present Yes              PLAN:  ?? Observation.  Only had mild hypoxia 88% while ambulating on room air.  COPD but no wheezing.  ?? Supportive care.  Might be able to go home with an inhaler and oxygen tomorrow.  Has COPD so home oxygen could probably be arranged.  ?? Is outside the tamiflu start window      Signed:  Elmer Ramp, MD

## 2016-04-11 NOTE — Discharge Summary (Signed)
Discharge Summary by Sheliah HatchSidhom, Massey Ruhland, MD at 04/11/16 1256                Author: Sheliah HatchSidhom, Teresa Lemmerman, MD  Service: Internal Medicine  Author Type: Physician       Filed: 04/11/16 1302  Date of Service: 04/11/16 1256  Status: Signed          Editor: Sheliah HatchSidhom, Claudene Gatliff, MD (Physician)                         Physician Discharge Summary        Patient ID:   Cody LinkDwain Woodbeck   [id number]   61 y.o.   Apr 13, 1955      Admit date: 04/10/2016      Discharge date: 04-11-2016      Diagnosis:   1- Influenza A, 4 days duration of symptoms, stable   2- Acute hypoxic respiratory failure, hx of COPD, resolved, O2 sat 93% walking and > 93% at rest before discharge   3- Hypokalemia, K 2.7, replaced, significant      Hospital course:    61 y/o M who presented to ER with 4 days of myalgias, arthralgias, HA, nausea, fevers, nonproductive cough, runny nose, SOB.  No CP, palpitations, abd pain,  vomiting, diarrhea, urinary symptoms.  Flu positive in ER.  Reports sick contact.  Hx COPD   Otherwise CBC, coagulation profile, CMP, Chest X-ray and EKG were unremarkable.   Patient admitted and treated as above.   On day of discharge, patient exam showed clear but diminished breath sounds. Pt did well on room air.      PCP: PROVIDER UNKNOWN      Medications:   KCL po 40 mEq daily for 7 days   Magnesium oxide 400 mg bid for 15 days      Patient Instructions:    There are no discharge medications for this patient.      Instructions:       PCP in 2 weeks- no Tobacco- low salt low fat diet- activity as tolerated- monitor blood pressure at home, goal < 135/80- check BMP and Mg in 1 week      Condition: Stable      Follow-up with primary physician in 1-2 week.   Time 35 min      Please send copy to primary physician.      Signed:   Sheliah HatchVictor Lark Langenfeld, MD   04/11/2016   12:56 PM

## 2016-04-11 NOTE — ED Notes (Signed)
I have reviewed discharge instructions with the patient.  The patient verbalized understanding.    Patient left ED via Discharge Method: ambulatory to Home with (insert name of family/friend, self, ).    Opportunity for questions and clarification provided.       Patient given 2 scripts.         To continue your aftercare when you leave the hospital, you may receive an automated call from our care team to check in on how you are doing.  This is a free service and part of our promise to provide the best care and service to meet your aftercare needs.??? If you have questions, or wish to unsubscribe from this service please call [phone].  Thank you for Choosing our [facility].

## 2016-05-15 ENCOUNTER — Other Ambulatory Visit: Payer: Self-pay | Admitting: *Deleted

## 2016-05-15 DIAGNOSIS — I10 Essential (primary) hypertension: Secondary | ICD-10-CM

## 2016-05-15 MED ORDER — HYDROCHLOROTHIAZIDE 25 MG PO TABS: 25.0000 mg | ORAL_TABLET | Freq: Every day | ORAL | 2 refills | Status: DC

## 2016-05-15 MED ORDER — LISINOPRIL 40 MG PO TABS: 40.0000 mg | ORAL_TABLET | Freq: Every day | ORAL | 2 refills | Status: DC

## 2016-05-15 NOTE — Telephone Encounter (Signed)
Patient called to request an appointment and refill.  Patient is almost out of blood pressure medications.  Appointment scheduled for June 01, 2016 at 2:30 PM with PCP.  Clovis PuMartin, Calena Salem L, RN

## 2016-05-18 ENCOUNTER — Encounter: Payer: Self-pay | Admitting: Pediatric Intensive Care

## 2016-05-18 DIAGNOSIS — Z5900 Homelessness unspecified: Secondary | ICD-10-CM

## 2016-05-18 LAB — GLUCOSE, POCT (MANUAL RESULT ENTRY): POC Glucose: 115 mg/dl — AB (ref 70–99)

## 2016-05-21 NOTE — Congregational Nurse Program (Signed)
Congregational Nurse Program Note  Date of Encounter: 05/16/2016  Past Medical History: No past medical history on file.  Encounter Details:     CNP Questionnaire - 05/16/16 1648      Patient Demographics   Is this a new or existing patient? New   Patient is considered a/an Not Applicable   Race African-American/Black     Patient Assistance   Location of Patient Assistance Not Applicable   Patient's financial/insurance status Low Income;Medicaid   Patient referred to apply for the following financial assistance Not Applicable   Food insecurities addressed Not Applicable   Transportation assistance Yes   Type of Assistance Bus Pass Given   Assistance securing medications No   Educational health offerings Chronic disease;Navigating the healthcare system     Encounter Details   Primary purpose of visit Chronic Illness/Condition Visit;Education/Health Concerns;Navigating the Healthcare System   Was an Emergency Department visit averted? Not Applicable   Does patient have a medical provider? Yes   Patient referred to Not Applicable   Was a mental health screening completed? (GAINS tool) No   Does patient have dental issues? No   Does patient have vision issues? No   Does your patient have an abnormal blood pressure today? No   Since previous encounter, have you referred patient for abnormal blood pressure that resulted in a new diagnosis or medication change? No   Does your patient have an abnormal blood glucose today? No   Since previous encounter, have you referred patient for abnormal blood glucose that resulted in a new diagnosis or medication change? No   Was there a life-saving intervention made? No      Requested bus passes to obtain medications from the pharmacy.  Bus passes given

## 2016-05-22 ENCOUNTER — Encounter: Payer: Self-pay | Admitting: Pediatric Intensive Care

## 2016-05-22 DIAGNOSIS — E119 Type 2 diabetes mellitus without complications: Secondary | ICD-10-CM

## 2016-05-22 LAB — GLUCOSE, POCT (MANUAL RESULT ENTRY): POC Glucose: 224 mg/dl — AB (ref 70–99)

## 2016-05-29 NOTE — Progress Notes (Signed)
   Subjective:   Patient ID: Ernest White    DOB: Feb 18, 1956, 61 y.o. male   MRN: [id number]  CC: "Diabetes and blood pressure"  HPI: Ernest GlassDwain L Sperling is a 61 y.o. male who presents to clinic today for diabetes and blood pressure follow-up. Problems discussed today are as follows:  Diabetes: Has a history of diabetes but without medications. Tries to avoid eating excessive sugar and carbohydrates. Interested in that are controlling his A1c.   High blood pressure: Taking lisinopril and HCTZ as prescribed since last visit back in October. Takes medications first thing when he gets up and does not miss a day. Tries to avoid eating excessive salt.  Smoking: Patient has 48 pack years smoking 1 pack per day. Taking albuterol and Dulera. Albuterol minimally while on controller medication. Says he is interested in smoking cessation.  Hemorrhoids: Ongoing issue since returning from [place] a few months ago standing with his sons for 2 months. States he has occasional bleeding when wiping. Denies straining. Endorses good water intake, however is on disability and lives a sedentary lifestyle.  ROS: complete ROS performed, see HPI for pertinent ROS.  PMFSH: HTN, COPD, NIDDM. Smoking status reviewed. Medications reviewed.  Objective:   BP 125/88   Pulse 84   Temp 97.8 F (36.6 C) (Oral)   Ht 5\' 10"  (1.778 m)   Wt 256 lb 12.8 oz (116.5 kg)   SpO2 97%   BMI 36.85 kg/m  Vitals and nursing note reviewed.  General: obese, well nourished, well developed, in no acute distress with non-toxic appearance HEENT: normocephalic, atraumatic, moist mucous membranes Neck: supple, non-tender without lymphadenopathy CV: regular rate and rhythm without murmurs, rubs, or gallops, no lower extremity edema Lungs: clear to auscultation bilaterally with normal work of breathing Abdomen: soft, non-tender, non-distended, no masses or organomegaly palpable, normoactive bowel sounds Skin: warm, dry, no  rashes or lesions, cap refill < 2 seconds Extremities: warm and well perfused, normal tone Rectal: external tag and hemorrhoid present, no gross bleeding appreciated, appropriate anal sphincter tone  Assessment & Plan:   Diabetes (HCC) Chronic. Uncontrolled. Not on medications. --Initiating Metformin 500 mg BID, will titrate based on next A1c --Educated on avoiding excessive sugar and carbs --Will need foot exam next visit --Will discuss vaccines during next visit --On ACE-I with controlled BP --RTC 1 month  Chronic obstructive airway disease (HCC) Chronic. On Dulera and SABA. Minimal SABA use.  --Albuterol refill given --Will discuss vaccines next visit including pneumo --Instructed to schedule meeting with Dr. Raymondo BandKoval for smoking cessation  External hemorrhoids Acute. Ongoing for past few months. Sedentary lifestyle. No acute bleeding. --Educated on safe bowel habits --Encouraged to increase water intake --Instructed to take preperation-H and Miralax --Will need screening colonoscopy, discuss next visit  Orders Placed This Encounter  Procedures  . HgB A1c   Meds ordered this encounter  Medications  . metFORMIN (GLUCOPHAGE) 500 MG tablet    Sig: Take 1 tablet (500 mg total) by mouth 2 (two) times daily with a meal.    Dispense:  180 tablet    Refill:  3  . albuterol (PROVENTIL HFA;VENTOLIN HFA) 108 (90 Base) MCG/ACT inhaler    Sig: Inhale 2 puffs into the lungs every 6 (six) hours as needed for wheezing or shortness of breath.    Dispense:  1 Inhaler    Refill:  2    Durward Parcelavid Momoko Slezak, DO [facility], PGY-1 06/03/2016 7:12 PM

## 2016-06-01 ENCOUNTER — Ambulatory Visit (INDEPENDENT_AMBULATORY_CARE_PROVIDER_SITE_OTHER): Payer: Medicaid Other | Admitting: Family Medicine

## 2016-06-01 ENCOUNTER — Encounter: Payer: Self-pay | Admitting: Pediatric Intensive Care

## 2016-06-01 ENCOUNTER — Encounter: Payer: Self-pay | Admitting: Family Medicine

## 2016-06-01 VITALS — BP 125/88 | HR 84 | Temp 97.8°F | Ht 70.0 in | Wt 256.8 lb

## 2016-06-01 DIAGNOSIS — J449 Chronic obstructive pulmonary disease, unspecified: Secondary | ICD-10-CM | POA: Diagnosis not present

## 2016-06-01 DIAGNOSIS — E119 Type 2 diabetes mellitus without complications: Secondary | ICD-10-CM | POA: Diagnosis not present

## 2016-06-01 DIAGNOSIS — K644 Residual hemorrhoidal skin tags: Secondary | ICD-10-CM | POA: Diagnosis not present

## 2016-06-01 DIAGNOSIS — J441 Chronic obstructive pulmonary disease with (acute) exacerbation: Secondary | ICD-10-CM | POA: Diagnosis not present

## 2016-06-01 LAB — POCT GLYCOSYLATED HEMOGLOBIN (HGB A1C): HEMOGLOBIN A1C: 6.8

## 2016-06-01 MED ORDER — METFORMIN HCL 500 MG PO TABS: 500.0000 mg | ORAL_TABLET | Freq: Two times a day (BID) | ORAL | 3 refills | Status: DC

## 2016-06-01 MED ORDER — ALBUTEROL SULFATE HFA 108 (90 BASE) MCG/ACT IN AERS: 2.0000 | INHALATION_SPRAY | Freq: Four times a day (QID) | RESPIRATORY_TRACT | 2 refills | Status: DC | PRN

## 2016-06-01 NOTE — Patient Instructions (Addendum)
Thank you for coming in to see [place] today. Please see below to review our plan for today's visit.  1. Her blood pressure is now controlled. Please continue taking your lisinopril and HCTZ as prescribed. Please avoid eating excessive salts and maintaining a diet with less than 2000 mg of sodium per day. 2. I have sent in a refill for your albuterol. Her lungs sound great today. Please continue taking your [facility] as prescribed. 3. You have diabetes with A1c of 6.8. He wanted maintain a level less than 7. He will need to avoid eating excessive carbohydrates including processes, potatoes, rice, and bread. I have written you a note to you can present to your facility to accommodate you. I also sent in a prescription of a medication called metformin which she will take 500 mg per twice per day. See information below. Common side effects include upset stomach and diarrhea. Sometime see her body needs adjustment on these medications for the symptoms to go away. If they continue to persist please stop the medication. 4. I am pleased to hear that you are wanting to stop smoking. Please get a appointment to see Dr. Valentina Lucks and you leave for smoking cessation. You can also call 1-800-quit-now for free material to help you stop. 5. Try Preparation H for your hemorrhoids. He will also need to have a screening colonoscopy at some point given that you're overdue. We will discuss this at you next visit. 6. Return to clinic in one month.  Please call the clinic at [phone] if your symptoms worsen or you have any concerns. It was my pleasure to see you. -- Harriet Butte, [place], PGY-1  Alogliptin; Metformin oral tablets What is this medicine? ALOGLIPTIN; METFORMIN (al oh GLIP tin; met FOR min) is a combination of 2 medicines used to treat type 2 diabetes. This medicine lowers blood sugar. Treatment is combined with a balanced diet and exercise. This medicine may be used for other purposes;  ask your health care provider or pharmacist if you have questions. COMMON BRAND NAME(S): Kazano What should I tell my health care provider before I take this medicine? They need to know if you have any of these conditions: -anemia -become easily dehydrated -diabetic ketoacidosis -heart disease -heart failure -history of alcohol abuse problem -if you often drink alcohol -kidney disease -liver disease -low levels of vitamin B12 in the blood -older than 80 years -pancreatitis -polycystic ovary syndrome -previous swelling of the tongue, face, or lips with difficulty breathing, difficulty swallowing, hoarseness, or tightening of the throat -serious infection or injury -thyroid disease -type 1 diabetes -undergoing surgery or certain procedures with injectable contrast agents -an unusual or allergic reaction to alogliptin, metformin, other medicines, foods, dyes, or preservatives -pregnant or trying to get pregnant -breast-feeding How should I use this medicine? Take this medicine by mouth with a glass of water. Take this medicine with food. Do not cut this medicine. Follow the directions on the prescription label. Take your doses at regular intervals. Do not take your medicine more often than directed. Do not stop taking except on your doctor's advice. Talk to your pediatrician regarding the use of this medicine in children. Special care may be needed. Overdosage: If you think you have taken too much of this medicine contact a poison control center or emergency room at once. NOTE: This medicine is only for you. Do not share this medicine with others. What if I miss a dose? If you miss a dose, take  it as soon as you can. If it is almost time for your next dose, take only that dose. Do not take double or extra doses. What may interact with this medicine? Do not take this medicine with any of the following medications: -certain contrast medicines given before X-rays, CT scans, MRI, or other  procedures -dofetilide -gatifloxacin This medicine may also interact with the following medications: -acetazolamide -alcohol -amiloride -certain antiviral medicines for HIV infection or hepatitis -cimetidine -crizotinib -digoxin -diuretics -male hormones, like estrogens or progestins and birth control pills -glycopyrrolate -isoniazid -lamotrigine -medicines for blood pressure, heart disease, irregular heart beat -memantine -methazolamide -midodrine -morphine -nicotinic acid -phenothiazines like chlorpromazine, mesoridazine, prochlorperazine, thioridazine -phenytoin -procainamide -propantheline -quinidine -quinine -ranitidine -ranolazine -steroid medicines like prednisone or cortisone -stimulant medicines for attention disorders, weight loss, or to stay awake -thyroid medicines -topiramate -trimethoprim -trospium -vancomycin -vandetanib -zonisamide This list may not describe all possible interactions. Give your health care provider a list of all the medicines, herbs, non-prescription drugs, or dietary supplements you use. Also tell them if you smoke, drink alcohol, or use illegal drugs. Some items may interact with your medicine. What should I watch for while using this medicine? Visit your doctor or health care professional for regular checks on your progress. A test called the HbA1C (A1C) will be monitored. This is a simple blood test. It measures your blood sugar control over the last 2 to 3 months. You will receive this test every 3 to 6 months. Learn how to check your blood sugar. Learn the symptoms of low and high blood sugar and how to manage them. Always carry a quick-source of sugar with you in case you have symptoms of low blood sugar. Examples include hard sugar candy or glucose tablets. Make sure others know that you can choke if you eat or drink when you develop serious symptoms of low blood sugar, such as seizures or unconsciousness. They must get medical  help at once. Tell your doctor or health care professional if you have high blood sugar. You might need to change the dose of your medicine. If you are sick or exercising more than usual, you might need to change the dose of your medicine. Do not skip meals. Ask your doctor or health care professional if you should avoid alcohol. Many nonprescription cough and cold products contain sugar or alcohol. These can affect blood sugar. This medicine may cause ovulation in premenopausal women who do not have regular monthly periods. This may increase your chances of becoming pregnant. You should not take this medicine if you become pregnant or think you may be pregnant. Talk with your doctor or health care professional about your birth control options while taking this medicine. Contact your doctor or health care professional right away if think you are pregnant. If you are going to need surgery, a MRI, CT scan, or other procedure, tell your doctor that you are taking this medicine. You may need to stop taking this medicine before the procedure. Wear a medical ID bracelet or chain, and carry a card that describes your disease and details of your medicine and dosage times. What side effects may I notice from receiving this medicine? Side effects that you should report to your doctor or health care professional as soon as possible: -allergic reactions like skin rash, itching or hives, swelling of the face, lips, or tongue -breathing problems -dark urine -general ill feeling or flu-like symptoms -joint pain -light-colored stools -loss of appetite -muscle pain -nausea, vomiting -redness, blistering,  peeling or loosening of the skin, including inside the mouth -right upper belly pain -signs and symptoms of low blood sugar such as feeling anxious, confusion, dizziness, increased hunger, unusually weak or tired, sweating, shakiness, cold, irritable, headache, blurred vision, fast heartbeat, loss of  consciousness -slow or irregular heartbeat -unusual stomach upset or pain -yellowing of the eyes or skin Side effects that usually do not require medical attention (report to your doctor or health care professional if they continue or are bothersome): -diarrhea -headache -heartburn -metallic taste in the mouth -stomach gas, upset -stuffy or runny nose This list may not describe all possible side effects. Call your doctor for medical advice about side effects. You may report side effects to FDA at 1-800-FDA-1088. Where should I keep my medicine? Keep out of the reach of children. Store at room temperature between 20 and 25 degrees C (68 and 77 degrees F). Throw away any unused medicine after the expiration date. NOTE: This sheet is a summary. It may not cover all possible information. If you have questions about this medicine, talk to your doctor, pharmacist, or health care provider.  2018 Elsevier/Gold Standard (2015-03-31 09:40:07)

## 2016-06-03 DIAGNOSIS — K649 Unspecified hemorrhoids: Secondary | ICD-10-CM | POA: Insufficient documentation

## 2016-06-03 DIAGNOSIS — K644 Residual hemorrhoidal skin tags: Secondary | ICD-10-CM | POA: Insufficient documentation

## 2016-06-03 NOTE — Assessment & Plan Note (Addendum)
Chronic. On Dulera and SABA. Minimal SABA use.  --Albuterol refill given --Will discuss vaccines next visit including pneumo --Instructed to schedule meeting with Dr. Raymondo BandKoval for smoking cessation

## 2016-06-03 NOTE — Assessment & Plan Note (Signed)
Acute. Ongoing for past few months. Sedentary lifestyle. No acute bleeding. --Educated on safe bowel habits --Encouraged to increase water intake --Instructed to take preperation-H and Miralax --Will need screening colonoscopy, discuss next visit

## 2016-06-03 NOTE — Assessment & Plan Note (Addendum)
Chronic. Uncontrolled. Not on medications. --Initiating Metformin 500 mg BID, will titrate based on next A1c --Educated on avoiding excessive sugar and carbs --Will need foot exam next visit --Will discuss vaccines during next visit --On ACE-I with controlled BP --RTC 1 month

## 2016-06-07 ENCOUNTER — Ambulatory Visit: Payer: Medicaid Other | Admitting: Pharmacist

## 2016-06-12 ENCOUNTER — Telehealth: Payer: Self-pay

## 2016-06-12 ENCOUNTER — Other Ambulatory Visit: Payer: Self-pay | Admitting: Family Medicine

## 2016-06-12 ENCOUNTER — Encounter: Payer: Self-pay | Admitting: Pediatric Intensive Care

## 2016-06-12 DIAGNOSIS — E119 Type 2 diabetes mellitus without complications: Secondary | ICD-10-CM

## 2016-06-12 LAB — GLUCOSE, POCT (MANUAL RESULT ENTRY): POC GLUCOSE: 155 mg/dL — AB (ref 70–99)

## 2016-06-12 NOTE — Telephone Encounter (Signed)
I do not see any medication history of glucose test strips. Can you recheck the patient and clarify what kind of stripping he needs? Thank you. -- Durward Parcel, DO [facility], PGY-1

## 2016-06-12 NOTE — Telephone Encounter (Signed)
Pt needs Microlet 2 test strips called into [facility]. Please let pt know when this has been done. Sunday Spillers, CMA

## 2016-06-13 ENCOUNTER — Telehealth: Payer: Self-pay | Admitting: Family Medicine

## 2016-06-13 ENCOUNTER — Other Ambulatory Visit: Payer: Self-pay | Admitting: Family Medicine

## 2016-06-13 MED ORDER — BAYER MICROLET LANCETS MISC: 12 refills | Status: DC

## 2016-06-13 NOTE — Telephone Encounter (Signed)
Lancets sent in to pharmacy. Please let patient know they are available for pick up. Thank you. -- Durward Parcel, DO [facility], PGY-1

## 2016-06-13 NOTE — Telephone Encounter (Signed)
Pt called back and said that he received the monitor from [facility]. He is using the [employer] jw

## 2016-06-14 ENCOUNTER — Encounter: Payer: Self-pay | Admitting: Pharmacist

## 2016-06-14 ENCOUNTER — Ambulatory Visit (INDEPENDENT_AMBULATORY_CARE_PROVIDER_SITE_OTHER): Payer: Medicaid Other | Admitting: Pharmacist

## 2016-06-14 DIAGNOSIS — E119 Type 2 diabetes mellitus without complications: Secondary | ICD-10-CM

## 2016-06-14 DIAGNOSIS — F17200 Nicotine dependence, unspecified, uncomplicated: Secondary | ICD-10-CM | POA: Insufficient documentation

## 2016-06-14 DIAGNOSIS — I10 Essential (primary) hypertension: Secondary | ICD-10-CM | POA: Diagnosis not present

## 2016-06-14 DIAGNOSIS — Z720 Tobacco use: Secondary | ICD-10-CM | POA: Diagnosis not present

## 2016-06-14 MED ORDER — ACCU-CHEK AVIVA PLUS W/DEVICE KIT: PACK | 0 refills | Status: DC

## 2016-06-14 MED ORDER — VARENICLINE TARTRATE 1 MG PO TABS: 1.0000 mg | ORAL_TABLET | Freq: Two times a day (BID) | ORAL | 0 refills | Status: DC

## 2016-06-14 MED ORDER — GLUCOSE BLOOD VI STRP: ORAL_STRIP | 12 refills | Status: DC

## 2016-06-14 MED ORDER — VARENICLINE TARTRATE 0.5 MG X 11 & 1 MG X 42 PO MISC: ORAL | 0 refills | Status: DC

## 2016-06-14 NOTE — Progress Notes (Signed)
   S:  Patient arrives in good spirits. Patient arrives for evaluation/assistance with tobacco dependence.  Patient was referred on 06/01/2016.  Patient was last seen by Primary Care Provider on 06/01/2016. He also recently started on metformin and has been having pain on the top of his left foot. He also had numbness on the bottom of his feet found on diabetic foot exam. Patient reports living in [facility]. He reports cramping in his thighs and arms 3-4x/week.   Age when started using tobacco on a daily basis 12  years. Number of Cigarettes per day: 1 ppd. Brand smoked [place] menthol 100's.   Smokes first cigarette > 60 minutes after waking. Denies waking to smoke overnight.   Most recent quit attempt: about 3 years ago Longest time ever been tobacco free: 3 weeks.  Medications (NRT, bupropion, varenicline) used in prior in past cessation efforts include: None, stated that brushing his teeth helped him quit for that period of 3 weeks. He also stated that nicotine patches helped in the past but he could not afford to use them for long.  Rates IMPORTANCE of quitting tobacco on 1-10 scale: 8-9 Rates CONFIDENCE of quitting tobacco on 1-10 scale: 8  Most common triggers to use tobacco include; Patient stated that "everyone around him smokes" and that they all take frequent smoke breaks together. He stays at the [facility].   Motivation to quit: Health, father passed away from lung cancer. He also reports low mood about two days of the week but denies suicidal thoughts. Reports history of seizures.  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 He is motivated by wanting to improve his health and live longer. Initiated varenicline tx starter pack. Patient counseled on purpose, proper use, and potential adverse effects, including GI upset, and potential  change in mood. Quit date planned for Sunday April 15.   Foot pain: Significant pain on the top left of his left foot. Diabetic foot exam performed. Unclear to what extent of numbness patient is experiencing. Previous history of gout flares. Discontinue HCTZ to prevent future "gout flares".   HTN: BP controlled. Discontinue HCTZ due to increased risk for gout flares and possible cause of thigh/arm cramps that are occurring 3-4x/week. If blood pressure increases in the next 2-4 weeks, initiate amlodipine 5 mg.  Obtained a BMET to assess electrolytes and will follow up with findings.   ASCVD risk >7.5%. Initiated aspirin 81 mg daily. Consider statin at future visit.   Written information provided. Provided information on 1 800-QUIT NOW support program.  F/U visit in 4 weeks. Total time in face-to-face counseling 60 minutes.  Patient seen with Coolidge Breeze, PharmD Bailey Mech, PharmD PGY-1 Resident and Hazle Nordmann, PharmD, BCPS, PGY2 Resident.

## 2016-06-14 NOTE — Assessment & Plan Note (Signed)
BP controlled. Discontinue HCTZ due to increased risk for gout flares and possible cause of thigh/arm cramps that are occurring 3-4x/week. If blood pressure increases in the next 2-4 weeks, initiate amlodipine 5 mg.  Obtained a BMET to assess electrolytes and will follow up with findings.

## 2016-06-14 NOTE — Patient Instructions (Addendum)
Stop Hydrochlorothiazide (HCTZ) Continue Lisinopril 40 mg daily  Start Chantix starter pack.  Take one 0.5 mg tablet by mouth once daily for 3 days, then increase to one 0.5 mg tablet twice daily for 4 days, then increase to one 1 mg tablet twice daily.  Please call clinic if you have any side effects or questions.    Set Quit Date for April 15th   Call 1-800-QUIT-NOW  Followup with Dr Raymondo Band in 4 weeks.

## 2016-06-15 LAB — BASIC METABOLIC PANEL
BUN/Creatinine Ratio: 16 (ref 10–24)
BUN: 14 mg/dL (ref 8–27)
CALCIUM: 9.7 mg/dL (ref 8.6–10.2)
CHLORIDE: 98 mmol/L (ref 96–106)
CO2: 27 mmol/L (ref 18–29)
Creatinine, Ser: 0.89 mg/dL (ref 0.76–1.27)
GFR calc Af Amer: 106 mL/min/{1.73_m2} (ref >59)
GFR calc non Af Amer: 92 mL/min/{1.73_m2} (ref >59)
GLUCOSE: 91 mg/dL (ref 65–99)
POTASSIUM: 4 mmol/L (ref 3.5–5.2)
SODIUM: 140 mmol/L (ref 134–144)

## 2016-06-15 NOTE — Progress Notes (Signed)
Patient ID: Ernest White, male   DOB: Apr 08, 1955, 61 y.o.   MRN: [id number] Reviewed: Agree with Dr. Macky Lower documentation and management.

## 2016-06-17 NOTE — Congregational Nurse Program (Signed)
Congregational Nurse Program Note  Date of Encounter: 05/18/2016  Past Medical History: No past medical history on file.  Encounter Details:     CNP Questionnaire - 06/17/16 1506      Patient Demographics   Is this a new or existing patient? New   Patient is considered a/an Not Applicable   Race African-American/Black     Patient Assistance   Location of Patient Assistance [place]   Patient's financial/insurance status Medicaid;Low Income   Uninsured Patient ([employer], trade union) No   Patient referred to apply for the following financial assistance Not Applicable   Food insecurities addressed Not Applicable   Transportation assistance No   Type of Assistance Other   Assistance securing medications No   Educational health offerings Navigating the healthcare system     Encounter Details   Primary purpose of visit Navigating the Healthcare System   Was an Emergency Department visit averted? Not Applicable   Patient referred to Establish PCP   Was a mental health screening completed? (GAINS tool) No   Does patient have dental issues? No   Does patient have vision issues? No   Does your patient have an abnormal blood pressure today? No   Since previous encounter, have you referred patient for abnormal blood pressure that resulted in a new diagnosis or medication change? No   Does your patient have an abnormal blood glucose today? No   Since previous encounter, have you referred patient for abnormal blood glucose that resulted in a new diagnosis or medication change? No   Was there a life-saving intervention made? No         Clinical Intake - 06/01/16 1435      Pre-visit preparation   Pre-visit preparation completed Yes     Functional Status   Activities of Daily Living Independent   Ambulation Independent   Medication Administration Independent   Home Management Independent     Abuse/Neglect   Do you feel unsafe in your current relationship? No   Do you feel  physically threatened by others? No   Anyone hurting you at home, work, or school? No     Patient Literacy   How often do you need to have someone help you when you read instructions, pamphlets, or other written materials from your doctor or pharmacy? 1 - Never   What is the last grade level you completed in school? 10th     Language Assistant   Interpreter Needed? No     BG/BP check. Client need s assistance establishing  PCP. Will follow up with CN as needed.

## 2016-06-17 NOTE — Congregational Nurse Program (Signed)
Congregational Nurse Program Note  Date of Encounter: 05/22/2016  Past Medical History: No past medical history on file.  Encounter Details:     CNP Questionnaire - 05/22/16 0845      Patient Demographics   Is this a new or existing patient? New   Patient is considered a/an Not Applicable   Race African-American/Black     Patient Assistance   Location of Patient Assistance [facility]   Patient's financial/insurance status Medicaid;Low Income   Uninsured Patient ([employer], trade union) No   Patient referred to apply for the following financial assistance Not Applicable   Food insecurities addressed Not Applicable   Transportation assistance Yes   Type of Assistance Bus Pass Given   Assistance securing medications No   Educational health offerings Navigating the healthcare system;Chronic disease     Encounter Details   Primary purpose of visit Chronic Illness/Condition Visit;Education/Health Concerns   Was an Emergency Department visit averted? Not Applicable   Does patient have a medical provider? Yes   Patient referred to Follow up with established PCP   Was a mental health screening completed? (GAINS tool) No   Does patient have dental issues? No   Does patient have vision issues? No   Does your patient have an abnormal blood pressure today? No   Since previous encounter, have you referred patient for abnormal blood pressure that resulted in a new diagnosis or medication change? No   Does your patient have an abnormal blood glucose today? Yes   Since previous encounter, have you referred patient for abnormal blood glucose that resulted in a new diagnosis or medication change? No   Was there a life-saving intervention made? No         Clinical Intake - 06/01/16 1435      Pre-visit preparation   Pre-visit preparation completed Yes     Functional Status   Activities of Daily Living Independent   Ambulation Independent   Medication Administration Independent   Home  Management Independent     Abuse/Neglect   Do you feel unsafe in your current relationship? No   Do you feel physically threatened by others? No   Anyone hurting you at home, work, or school? No     Patient Literacy   How often do you need to have someone help you when you read instructions, pamphlets, or other written materials from your doctor or pharmacy? 1 - Never   What is the last grade level you completed in school? 10th     Language Assistant   Interpreter Needed? No     Cleint in for BP/BG check. Has upcoming appointment at [facility] to follow up on diabetes labs. Bus passes given.

## 2016-06-18 NOTE — Congregational Nurse Program (Signed)
Congregational Nurse Program Note  Date of Encounter: 06/01/2016  Past Medical History: No past medical history on file.  Encounter Details:     CNP Questionnaire - 06/17/16 1506      Patient Demographics   Is this a new or existing patient? New   Patient is considered a/an Not Applicable   Race African-American/Black     Patient Assistance   Location of Patient Assistance [facility]   Patient's financial/insurance status Medicaid;Low Income   Uninsured Patient ([employer], trade union) No   Patient referred to apply for the following financial assistance Not Applicable   Food insecurities addressed Not Applicable   Transportation assistance No   Type of Assistance Other   Assistance securing medications No   Educational health offerings Navigating the healthcare system     Encounter Details   Primary purpose of visit Navigating the Healthcare System   Was an Emergency Department visit averted? Not Applicable   Does patient have a medical provider? No   Patient referred to Establish PCP   Was a mental health screening completed? (GAINS tool) No   Does patient have dental issues? No   Does patient have vision issues? No   Does your patient have an abnormal blood pressure today? No   Since previous encounter, have you referred patient for abnormal blood pressure that resulted in a new diagnosis or medication change? No   Does your patient have an abnormal blood glucose today? No   Since previous encounter, have you referred patient for abnormal blood glucose that resulted in a new diagnosis or medication change? No   Was there a life-saving intervention made? No         Clinical Intake - 06/01/16 1435      Pre-visit preparation   Pre-visit preparation completed Yes     Functional Status   Activities of Daily Living Independent   Ambulation Independent   Medication Administration Independent   Home Management Independent     Abuse/Neglect   Do you feel unsafe in your  current relationship? No   Do you feel physically threatened by others? No   Anyone hurting you at home, work, or school? No     Patient Literacy   How often do you need to have someone help you when you read instructions, pamphlets, or other written materials from your doctor or pharmacy? 1 - Never   What is the last grade level you completed in school? 10th     Language Assistant   Interpreter Needed? No    BP check.

## 2016-06-25 ENCOUNTER — Encounter: Payer: Self-pay | Admitting: Pediatric Intensive Care

## 2016-06-28 ENCOUNTER — Encounter (HOSPITAL_COMMUNITY): Payer: Self-pay

## 2016-06-28 ENCOUNTER — Emergency Department (HOSPITAL_COMMUNITY): Payer: Medicaid Other

## 2016-06-28 ENCOUNTER — Emergency Department (HOSPITAL_COMMUNITY)
Admission: EM | Admit: 2016-06-28 | Discharge: 2016-06-29 | Disposition: A | Discharge status: Home / Self Care | Payer: Medicaid Other | Attending: Emergency Medicine | Admitting: Emergency Medicine

## 2016-06-28 DIAGNOSIS — J449 Chronic obstructive pulmonary disease, unspecified: Secondary | ICD-10-CM | POA: Diagnosis not present

## 2016-06-28 DIAGNOSIS — Z7984 Long term (current) use of oral hypoglycemic drugs: Secondary | ICD-10-CM | POA: Insufficient documentation

## 2016-06-28 DIAGNOSIS — I10 Essential (primary) hypertension: Secondary | ICD-10-CM | POA: Insufficient documentation

## 2016-06-28 DIAGNOSIS — R5383 Other fatigue: Secondary | ICD-10-CM | POA: Diagnosis present

## 2016-06-28 DIAGNOSIS — E860 Dehydration: Secondary | ICD-10-CM | POA: Insufficient documentation

## 2016-06-28 DIAGNOSIS — E119 Type 2 diabetes mellitus without complications: Secondary | ICD-10-CM | POA: Diagnosis not present

## 2016-06-28 DIAGNOSIS — F17210 Nicotine dependence, cigarettes, uncomplicated: Secondary | ICD-10-CM | POA: Insufficient documentation

## 2016-06-28 DIAGNOSIS — Z79899 Other long term (current) drug therapy: Secondary | ICD-10-CM | POA: Insufficient documentation

## 2016-06-28 HISTORY — DX: Unspecified osteoarthritis, unspecified site: M19.90

## 2016-06-28 HISTORY — DX: Chronic obstructive pulmonary disease, unspecified: J44.9

## 2016-06-28 HISTORY — DX: Essential (primary) hypertension: I10

## 2016-06-28 HISTORY — DX: Type 2 diabetes mellitus without complications: E11.9

## 2016-06-28 HISTORY — DX: Gout, unspecified: M10.9

## 2016-06-28 LAB — COMPREHENSIVE METABOLIC PANEL
ALT: 22 U/L (ref 17–63)
AST: 26 U/L (ref 15–41)
Albumin: 3.7 g/dL (ref 3.5–5.0)
Alkaline Phosphatase: 60 U/L (ref 38–126)
Anion gap: 7 (ref 5–15)
BUN: 17 mg/dL (ref 6–20)
CO2: 28 mmol/L (ref 22–32)
CREATININE: 0.95 mg/dL (ref 0.61–1.24)
Calcium: 9.3 mg/dL (ref 8.9–10.3)
Chloride: 101 mmol/L (ref 101–111)
GFR calc non Af Amer: >60 mL/min (ref >60)
GLUCOSE: 134 mg/dL — AB (ref 65–99)
Potassium: 3.6 mmol/L (ref 3.5–5.1)
SODIUM: 136 mmol/L (ref 135–145)
Total Bilirubin: 0.9 mg/dL (ref 0.3–1.2)
Total Protein: 6.5 g/dL (ref 6.5–8.1)

## 2016-06-28 LAB — CBC WITH DIFFERENTIAL/PLATELET
BASOS ABS: 0 10*3/uL (ref 0.0–0.1)
Basophils Relative: 0 %
Eosinophils Absolute: 0.2 10*3/uL (ref 0.0–0.7)
Eosinophils Relative: 2 %
HEMATOCRIT: 38.8 % — AB (ref 39.0–52.0)
Hemoglobin: 12.9 g/dL — ABNORMAL LOW (ref 13.0–17.0)
LYMPHS PCT: 48 %
Lymphs Abs: 4.3 10*3/uL — ABNORMAL HIGH (ref 0.7–4.0)
MCH: 28.4 pg (ref 26.0–34.0)
MCHC: 33.2 g/dL (ref 30.0–36.0)
MCV: 85.5 fL (ref 78.0–100.0)
MONO ABS: 0.8 10*3/uL (ref 0.1–1.0)
Monocytes Relative: 9 %
NEUTROS ABS: 3.7 10*3/uL (ref 1.7–7.7)
Neutrophils Relative %: 41 %
Platelets: 192 10*3/uL (ref 150–400)
RBC: 4.54 MIL/uL (ref 4.22–5.81)
RDW: 15.9 % — ABNORMAL HIGH (ref 11.5–15.5)
WBC: 8.9 10*3/uL (ref 4.0–10.5)

## 2016-06-28 LAB — URINALYSIS, ROUTINE W REFLEX MICROSCOPIC
Bilirubin Urine: NEGATIVE
GLUCOSE, UA: NEGATIVE mg/dL
Hgb urine dipstick: NEGATIVE
Ketones, ur: NEGATIVE mg/dL
LEUKOCYTES UA: NEGATIVE
Nitrite: NEGATIVE
PH: 5 (ref 5.0–8.0)
Protein, ur: NEGATIVE mg/dL
Specific Gravity, Urine: 1.017 (ref 1.005–1.030)

## 2016-06-28 LAB — TROPONIN I

## 2016-06-28 LAB — LIPASE, BLOOD: Lipase: 44 U/L (ref 11–51)

## 2016-06-28 MED ORDER — SODIUM CHLORIDE 0.9 % IV BOLUS (SEPSIS)
1000.0000 mL | Freq: Once | INTRAVENOUS | Status: AC
  Administered 2016-06-28: 1000 mL via INTRAVENOUS

## 2016-06-28 MED ORDER — ONDANSETRON HCL 4 MG/2ML IJ SOLN
4.0000 mg | Freq: Once | INTRAMUSCULAR | Status: AC
  Administered 2016-06-29: 4 mg via INTRAVENOUS
  Filled 2016-06-28: qty 2

## 2016-06-28 NOTE — ED Triage Notes (Signed)
Per EMS, pt from [facility], pt states "I don't feel good and haven't since I started taking metform 2-3 weeks ago" Pt denies n/v/d. Denies CP or shob. Pt just admits to general malaise. VS 172/88, HR 80, RR 18, CBG 111.

## 2016-06-28 NOTE — ED Provider Notes (Signed)
[facility]-EMERGENCY DEPT Provider Note   CSN: [id number] Arrival date & time: 06/28/16  2005  By signing my name below, I, Ernest White, attest that this documentation has been prepared under the direction and in the presence of  , MD. Electronically Signed: Hailei White, Scribe. 06/28/16. 11:06 PM.   History   Chief Complaint Chief Complaint  Patient presents with  . Fatigue    HPI Comments: Ernest White is a 61 y.o. male with a history of DM, HTN, and COPD, who presents to the Emergency Department by ambulance, complaining of sudden-onset, constant fatigue that began 2-3 weeks ago. Patient states he has been weak ever since being placed on metformin. Patient states he does not know what his blood sugars were prior to taking this medication. Patient reports associated nausea, dizziness, and a poor appetite. No modifying factors indicated. Patient is a current everyday smoker. Patient denies any substance abuse, fever, vomiting, chest pain, or shortness of breath. EMS vitals: BP 172/88, HR 80, PR 18, BG 111.   The history is provided by the patient. No language interpreter was used.    Past Medical History:  Diagnosis Date  . Arthritis   . COPD (chronic obstructive pulmonary disease) (HCC)   . Diabetes mellitus without complication (HCC)   . Gout   . Hypertension     Patient Active Problem List   Diagnosis Date Noted  . Tobacco abuse 06/14/2016  . External hemorrhoids 06/03/2016  . Syncope and collapse 12/27/2015  . Diabetes (HCC) 12/27/2015  . Chronic obstructive airway disease (HCC) 12/23/2015  . Essential hypertension 12/15/2015    Past Surgical History:  Procedure Laterality Date  . APPENDECTOMY    . HERNIA REPAIR         Home Medications    Prior to Admission medications   Medication Sig Start Date End Date Taking? Authorizing Provider  albuterol (PROVENTIL HFA;VENTOLIN HFA) 108 (90 Base) MCG/ACT inhaler Inhale 2 puffs into the lungs every 6  (six) hours as needed for wheezing or shortness of breath. 06/01/16   David J McMullen, DO  BAYER MICROLET LANCETS lancets Use as instructed 06/13/16   David J McMullen, DO  Blood Glucose Monitoring Suppl (ACCU-CHEK AVIVA PLUS) w/Device KIT Use as directed to check blood glucose up to twice daily. E11.9.  May substitute for formulary preferred. 06/14/16   William A Hensel, MD  glucose blood (ACCU-CHEK AVIVA PLUS) test strip Use as directed to check blood glucose up to twice daily. E11.9.  May substitute for formulary preferred. 06/14/16   William A Hensel, MD  lisinopril (PRINIVIL,ZESTRIL) 40 MG tablet Take 1 tablet (40 mg total) by mouth daily. 05/15/16   David J McMullen, DO  metFORMIN (GLUCOPHAGE) 500 MG tablet Take 1 tablet (500 mg total) by mouth 2 (two) times daily with a meal. 06/01/16   David J McMullen, DO  mometasone-formoterol (DULERA) 200-5 MCG/ACT AERO Inhale 2 puffs into the lungs 2 (two) times daily. 12/27/15   David J McMullen, DO  varenicline (CHANTIX CONTINUING MONTH PAK) 1 MG tablet Take 1 tablet (1 mg total) by mouth 2 (two) times daily. 06/14/16   William A Hensel, MD  varenicline (CHANTIX STARTING MONTH PAK) 0.5 MG X 11 & 1 MG X 42 tablet Take one 0.5 mg tablet once daily for 3 days, then increase to one 0.5 mg tablet BID for 4 days, then increase to one 1 mg tablet BID. 06/14/16   William A Hensel, MD    Family History History reviewed. No   pertinent family history.  Social History Social History  Substance Use Topics  . Smoking status: Current Every Day Smoker    Packs/day: 1.00    Years: 48.00    Types: Cigarettes  . Smokeless tobacco: Never Used     Comment: Works at tobacco plant  . Alcohol use 0.6 oz/week    1 Cans of beer per week     Comment: "moderately"     Allergies   Penicillins   Review of Systems Review of Systems  Constitutional: Positive for appetite change and fatigue. Negative for chills and fever.  Respiratory: Negative for shortness of breath.     Cardiovascular: Negative for chest pain.  Gastrointestinal: Positive for nausea. Negative for vomiting.  Neurological: Positive for dizziness.  All other systems reviewed and are negative.    Physical Exam Updated Vital Signs BP 117/78   Pulse 79   Temp 97.4 F (36.3 C) (Oral)   Resp 19   Ht 5' 11" (1.803 m)   Wt 265 lb (120.2 kg)   SpO2 97%   BMI 36.96 kg/m   Physical Exam  Constitutional: He is oriented to person, place, and time. He appears well-developed and well-nourished.  HENT:  Head: Normocephalic and atraumatic.  Mouth/Throat: Oropharynx is clear and moist.  Eyes: Conjunctivae and EOM are normal. Pupils are equal, round, and reactive to light.  Neck: Normal range of motion. Neck supple.  Cardiovascular: Normal rate and regular rhythm.   Pulmonary/Chest: Effort normal and breath sounds normal.  Abdominal: Soft. Bowel sounds are normal.  Musculoskeletal: Normal range of motion. He exhibits no tenderness.  Neurological: He is alert and oriented to person, place, and time.  Skin: Skin is warm and dry.  Psychiatric: He has a normal mood and affect.  Nursing note and vitals reviewed.    ED Treatments / Results  DIAGNOSTIC STUDIES: Oxygen Saturation is 96% on RA, adequate by my interpretation.    COORDINATION OF CARE: 11:06 PM Discussed treatment plan with pt at bedside and pt agreed to plan.   Labs (all labs ordered are listed, but only abnormal results are displayed) Labs Reviewed  COMPREHENSIVE METABOLIC PANEL - Abnormal; Notable for the following:       Result Value   Glucose, Bld 134 (*)    All other components within normal limits  CBC WITH DIFFERENTIAL/PLATELET - Abnormal; Notable for the following:    Hemoglobin 12.9 (*)    HCT 38.8 (*)    RDW 15.9 (*)    Lymphs Abs 4.3 (*)    All other components within normal limits  LIPASE, BLOOD  TROPONIN I  URINALYSIS, ROUTINE W REFLEX MICROSCOPIC    EKG  EKG Interpretation  Date/Time:  Thursday June 28 2016 23:22:19 EDT Ventricular Rate:  73 PR Interval:    QRS Duration: 86 QT Interval:  406 QTC Calculation: 448 R Axis:   68 Text Interpretation:  Sinus rhythm Borderline T wave abnormalities Confirmed by  MD,  ([id number]) on 06/28/2016 11:25:44 PM       Radiology Dg Chest 2 View  Result Date: 06/28/2016 CLINICAL DATA:  Cough, shortness of breath, weakness, and fatigue for 2 weeks. History of hypertension, diabetes, COPD, smoker. EXAM: CHEST  2 VIEW COMPARISON:  None. FINDINGS: Mild hyperinflation suggesting emphysematous change. Borderline heart size and pulmonary vascularity are likely normal for technique. No focal airspace disease or consolidation in the lungs. No blunting of costophrenic angles. No pneumothorax. Degenerative changes in the spine. IMPRESSION: Emphysematous changes in the lungs.   No evidence of active pulmonary disease. Electronically Signed   By: Lucienne Capers M.D.   On: 06/28/2016 23:54    Procedures Procedures (including critical care time)  Medications Ordered in ED Medications  sodium chloride 0.9 % bolus 1,000 mL (not administered)  ondansetron (ZOFRAN) injection 4 mg (not administered)  ketorolac (TORADOL) 30 MG/ML injection 30 mg (not administered)     Initial Impression / Assessment and Plan / ED Course  I have reviewed the triage vital signs and the nursing notes.  Pertinent labs & imaging results that were available during my care of the patient were reviewed by me and considered in my medical decision making (see chart for details).   Pt is feeling much better.  He knows to return if worse and to f/u with pcp.  Final Clinical Impressions(s) / ED Diagnoses   Final diagnoses:  Dehydration    New Prescriptions New Prescriptions   No medications on file   I personally performed the services described in this documentation, which was scribed in my presence. The recorded information has been reviewed and is accurate.     Isla Pence, MD 06/29/16 [phone]

## 2016-06-29 MED ORDER — KETOROLAC TROMETHAMINE 30 MG/ML IJ SOLN
30.0000 mg | Freq: Once | INTRAMUSCULAR | Status: AC
  Administered 2016-06-29: 30 mg via INTRAVENOUS
  Filled 2016-06-29: qty 1

## 2016-07-02 NOTE — Congregational Nurse Program (Signed)
Congregational Nurse Program Note  Date of Encounter: 06/12/2016  Past Medical History: Past Medical History:  Diagnosis Date  . Arthritis   . COPD (chronic obstructive pulmonary disease) (HCC)   . Diabetes mellitus without complication (HCC)   . Gout   . Hypertension     Encounter Details:     CNP Questionnaire - 06/18/16 0943      Patient Demographics   Is this a new or existing patient? Existing   Patient is considered a/an Not Applicable   Race African-American/Black     Patient Assistance   Location of Patient Assistance [facility]   Patient's financial/insurance status Medicaid;Low Income   Uninsured Patient ([employer], trade union) No   Patient referred to apply for the following financial assistance Not Applicable   Food insecurities addressed Not Applicable   Transportation assistance No   Assistance securing medications No   Educational Programmer, systems the healthcare system     Encounter Details   Primary purpose of visit Navigating the Healthcare System   Was an Emergency Department visit averted? Not Applicable   Does patient have a medical provider? No   Patient referred to Establish PCP   Was a mental health screening completed? (GAINS tool) No   Does patient have dental issues? No   Does patient have vision issues? No   Does your patient have an abnormal blood pressure today? Yes   Since previous encounter, have you referred patient for abnormal blood pressure that resulted in a new diagnosis or medication change? No   Does your patient have an abnormal blood glucose today? No   Since previous encounter, have you referred patient for abnormal blood glucose that resulted in a new diagnosis or medication change? No   Was there a life-saving intervention made? No     BP/BG check. Client has started metformin. No complaints. Will follow up with CN as needed.

## 2016-07-02 NOTE — Congregational Nurse Program (Signed)
Congregational Nurse Program Note  Date of Encounter: 06/18/2016  Past Medical History: Past Medical History:  Diagnosis Date  . Arthritis   . COPD (chronic obstructive pulmonary disease) (HCC)   . Diabetes mellitus without complication (HCC)   . Gout   . Hypertension     Encounter Details:     CNP Questionnaire - 06/18/16 0943      Patient Demographics   Is this a new or existing patient? Existing   Patient is considered a/an Not Applicable   Race African-American/Black     Patient Assistance   Location of Patient Assistance [facility]   Patient's financial/insurance status Medicaid;Low Income   Uninsured Patient ([employer], trade union) No   Patient referred to apply for the following financial assistance Not Applicable   Food insecurities addressed Not Applicable   Transportation assistance No   Assistance securing medications No   Educational Programmer, systems the healthcare system     Encounter Details   Primary purpose of visit Navigating the Healthcare System   Was an Emergency Department visit averted? Not Applicable   Does patient have a medical provider? No   Patient referred to Establish PCP   Was a mental health screening completed? (GAINS tool) No   Does patient have dental issues? No   Does patient have vision issues? No   Does your patient have an abnormal blood pressure today? Yes   Since previous encounter, have you referred patient for abnormal blood pressure that resulted in a new diagnosis or medication change? No   Does your patient have an abnormal blood glucose today? No   Since previous encounter, have you referred patient for abnormal blood glucose that resulted in a new diagnosis or medication change? No   Was there a life-saving intervention made? No     requested B/P check.  B/P 150/90 but states had just finished smoking.  Discussed with him the importance of smoking cessation.

## 2016-07-04 NOTE — Congregational Nurse Program (Signed)
Congregational Nurse Program Note  Date of Encounter: 07/02/2016  Past Medical History: Past Medical History:  Diagnosis Date  . Arthritis   . COPD (chronic obstructive pulmonary disease) (HCC)   . Diabetes mellitus without complication (HCC)   . Gout   . Hypertension     Encounter Details:     CNP Questionnaire - 07/02/16 1517      Patient Demographics   Is this a new or existing patient? Existing   Patient is considered a/an Not Applicable   Race African-American/Black     Patient Assistance   Location of Patient Assistance Not Applicable   Patient's financial/insurance status Medicaid;Low Income   Uninsured Patient ([employer], trade union) No   Patient referred to apply for the following financial assistance Not Applicable   Food insecurities addressed Not Applicable   Transportation assistance No   Assistance securing medications No   Educational Programmer, systems the healthcare system     Encounter Details   Primary purpose of visit Education/Health Concerns;Chronic Illness/Condition Visit   Was an Emergency Department visit averted? Not Applicable   Does patient have a medical provider? No   Patient referred to Establish PCP   Was a mental health screening completed? (GAINS tool) No   Does patient have dental issues? No   Does patient have vision issues? No   Does your patient have an abnormal blood pressure today? Yes   Since previous encounter, have you referred patient for abnormal blood pressure that resulted in a new diagnosis or medication change? No   Does your patient have an abnormal blood glucose today? No   Since previous encounter, have you referred patient for abnormal blood glucose that resulted in a new diagnosis or medication change? No   Was there a life-saving intervention made? No     B/P check 140/98.  Client states he had just finished smoking.  Discussed with client how smoking impacts his B/P and encouraged him to begin  stopping smoking

## 2016-07-04 NOTE — Assessment & Plan Note (Deleted)
Has 50 pack year history. Current every day smoker 1 ppd.

## 2016-07-04 NOTE — Progress Notes (Deleted)
   Subjective:   Patient ID: Ernest White    DOB: 1955-06-11, 61 y.o. male   MRN: [id number]  CC: "***"  HPI: Ernest White is a 61 y.o. male who presents to clinic today ***. Problems discussed today are as follows:  ***: ***  ***Last seen 05/2016 for uncontrolled DM, started Metformin 500 mg BID. Needs updated A1c and foot exam, vaccinations. Seen by Raymondo Band 06/2016 and started on Chantix with quit date 4/15. Stopped HCTZ 2/2 h/o gout. Would initiate amlodipine 5 mg QD. Consider high-intensity statin therapy for ASCVD 47.2%. Seen in ED 06/2016 for dehydration.  ROS: complete ROS performed, see HPI for pertinent ROS.  PMFSH: HTN, COPD, NIDDM, tobacco use disorder. Smoking status reviewed. Medications reviewed.  Objective:   There were no vitals taken for this visit. Vitals and nursing note reviewed.  General: well nourished, well developed, in no acute distress with non-toxic appearance HEENT: normocephalic, atraumatic, moist mucous membranes Neck: supple, non-tender without lymphadenopathy CV: regular rate and rhythm without murmurs, rubs, or gallops, no lower extremity edema Lungs: clear to auscultation bilaterally with normal work of breathing Abdomen: soft, non-tender, non-distended, no masses or organomegaly palpable, normoactive bowel sounds Skin: warm, dry, no rashes or lesions, cap refill < 2 seconds Extremities: warm and well perfused, normal tone  Assessment & Plan:   No problem-specific Assessment & Plan notes found for this encounter.  No orders of the defined types were placed in this encounter.  No orders of the defined types were placed in this encounter.   Durward Parcel, DO [facility], PGY-1 07/04/2016 2:08 PM

## 2016-07-06 ENCOUNTER — Ambulatory Visit: Payer: Medicaid Other | Admitting: Family Medicine

## 2016-07-10 ENCOUNTER — Encounter: Payer: Self-pay | Admitting: Pediatric Intensive Care

## 2016-07-10 NOTE — Progress Notes (Deleted)
   Subjective:   Patient ID: Ernest White    DOB: 1955-11-24, 61 y.o. male   MRN: [id number]  CC: "***"  HPI: Ernest White is a 61 y.o. male who presents to clinic today ***. Problems discussed today are as follows:  ***: *** ROS: ***  Complete ROS performed, see HPI for pertinent.  PMFSH: NIDDM, COPD, HTN, tobacco use disorder. Smoking status reviewed. Medications reviewed.  Objective:   There were no vitals taken for this visit. Vitals and nursing note reviewed.  General: well nourished, well developed, in no acute distress with non-toxic appearance HEENT: normocephalic, atraumatic, moist mucous membranes Neck: supple, non-tender without lymphadenopathy CV: regular rate and rhythm without murmurs, rubs, or gallops, no lower extremity edema Lungs: clear to auscultation bilaterally with normal work of breathing Abdomen: soft, non-tender, non-distended, no masses or organomegaly palpable, normoactive bowel sounds Skin: warm, dry, no rashes or lesions, cap refill < 2 seconds Extremities: warm and well perfused, normal tone  Assessment & Plan:   No problem-specific Assessment & Plan notes found for this encounter.  No orders of the defined types were placed in this encounter.  No orders of the defined types were placed in this encounter.   Durward Parcel, DO [facility], PGY-1 07/10/2016 3:16 PM

## 2016-07-11 ENCOUNTER — Ambulatory Visit: Payer: Medicaid Other | Admitting: Family Medicine

## 2016-07-16 ENCOUNTER — Telehealth: Payer: Self-pay | Admitting: Family Medicine

## 2016-07-16 ENCOUNTER — Ambulatory Visit: Payer: Medicaid Other | Admitting: Family Medicine

## 2016-07-16 NOTE — Telephone Encounter (Signed)
Called patient regarding third consecutive missed appointment (4/27, 5/2, 5/7). Patient states he "just forgot." He does not have a car but transportation is not an issue according to the patient. He says he recently moved into an apartment today. Patient states he will call the clinic after this discussion to schedule follow-up appointment.  -- Durward Parcelavid Aristides Luckey, DO [facility], PGY-1

## 2016-07-16 NOTE — Congregational Nurse Program (Signed)
Congregational Nurse Program Note  Date of Encounter: 06/25/2016  Past Medical History: Past Medical History:  Diagnosis Date  . Arthritis   . COPD (chronic obstructive pulmonary disease) (HCC)   . Diabetes mellitus without complication (HCC)   . Gout   . Hypertension     Encounter Details:     CNP Questionnaire - 07/02/16 1517      Patient Demographics   Is this a new or existing patient? Existing   Patient is considered a/an Not Applicable   Race African-American/Black     Patient Assistance   Location of Patient Assistance Not Applicable   Patient's financial/insurance status Medicaid;Low Income   Uninsured Patient ([employer] officer, trade unionCard/Care Connects) No   Patient referred to apply for the following financial assistance Not Applicable   Food insecurities addressed Not Applicable   Transportation assistance No   Assistance securing medications No   Educational Programmer, systemshealth offerings Navigating the healthcare system     Encounter Details   Primary purpose of visit Education/Health Concerns;Chronic Illness/Condition Visit   Was an Emergency Department visit averted? Not Applicable   Does patient have a medical provider? No   Patient referred to Establish PCP   Was a mental health screening completed? (GAINS tool) No   Does patient have dental issues? No   Does patient have vision issues? No   Does your patient have an abnormal blood pressure today? Yes   Since previous encounter, have you referred patient for abnormal blood pressure that resulted in a new diagnosis or medication change? No   Does your patient have an abnormal blood glucose today? No   Since previous encounter, have you referred patient for abnormal blood glucose that resulted in a new diagnosis or medication change? No   Was there a life-saving intervention made? No     Client requests glucometer demo. CN provided demo and client returned demo. BG 106. Discussed continued carbohydrate limits with meals. Client will  follow up with CN for BP checks as needed.

## 2016-07-20 ENCOUNTER — Encounter: Payer: Self-pay | Admitting: Family Medicine

## 2016-07-20 DIAGNOSIS — Z91199 Patient's noncompliance with other medical treatment and regimen due to unspecified reason: Secondary | ICD-10-CM | POA: Insufficient documentation

## 2016-07-20 DIAGNOSIS — Z5329 Procedure and treatment not carried out because of patient's decision for other reasons: Secondary | ICD-10-CM | POA: Insufficient documentation

## 2016-07-20 NOTE — Progress Notes (Signed)
Subjective  Patient is presenting with the following illnesses     Chief Complaint noted Review of Symptoms - see HPI PMH - Smoking status noted.     Objective Vital Signs reviewed     Assessments/Plans  No problem-specific Assessment & Plan notes found for this encounter.   See Encounter view if individual problem A/Ps not visible See after visit summary for details of patient instuctions 

## 2016-07-21 ENCOUNTER — Emergency Department (HOSPITAL_COMMUNITY): Payer: Medicaid Other

## 2016-07-21 ENCOUNTER — Encounter (HOSPITAL_COMMUNITY): Payer: Self-pay

## 2016-07-21 ENCOUNTER — Inpatient Hospital Stay (HOSPITAL_COMMUNITY)
Admission: EM | Admit: 2016-07-21 | Discharge: 2016-07-25 | DRG: 871 | Disposition: A | Discharge status: Home / Self Care | Payer: Medicaid Other | Attending: Family Medicine | Admitting: Family Medicine

## 2016-07-21 DIAGNOSIS — Z79899 Other long term (current) drug therapy: Secondary | ICD-10-CM

## 2016-07-21 DIAGNOSIS — N179 Acute kidney failure, unspecified: Secondary | ICD-10-CM | POA: Diagnosis present

## 2016-07-21 DIAGNOSIS — J449 Chronic obstructive pulmonary disease, unspecified: Secondary | ICD-10-CM | POA: Diagnosis present

## 2016-07-21 DIAGNOSIS — E860 Dehydration: Secondary | ICD-10-CM | POA: Diagnosis present

## 2016-07-21 DIAGNOSIS — J154 Pneumonia due to other streptococci: Secondary | ICD-10-CM | POA: Diagnosis present

## 2016-07-21 DIAGNOSIS — J440 Chronic obstructive pulmonary disease with acute lower respiratory infection: Secondary | ICD-10-CM | POA: Diagnosis not present

## 2016-07-21 DIAGNOSIS — F17210 Nicotine dependence, cigarettes, uncomplicated: Secondary | ICD-10-CM | POA: Diagnosis present

## 2016-07-21 DIAGNOSIS — A409 Streptococcal sepsis, unspecified: Principal | ICD-10-CM | POA: Diagnosis present

## 2016-07-21 DIAGNOSIS — E861 Hypovolemia: Secondary | ICD-10-CM | POA: Diagnosis not present

## 2016-07-21 DIAGNOSIS — R109 Unspecified abdominal pain: Secondary | ICD-10-CM

## 2016-07-21 DIAGNOSIS — Z886 Allergy status to analgesic agent status: Secondary | ICD-10-CM

## 2016-07-21 DIAGNOSIS — I10 Essential (primary) hypertension: Secondary | ICD-10-CM | POA: Diagnosis present

## 2016-07-21 DIAGNOSIS — E119 Type 2 diabetes mellitus without complications: Secondary | ICD-10-CM | POA: Diagnosis present

## 2016-07-21 DIAGNOSIS — M1990 Unspecified osteoarthritis, unspecified site: Secondary | ICD-10-CM | POA: Diagnosis present

## 2016-07-21 DIAGNOSIS — E1165 Type 2 diabetes mellitus with hyperglycemia: Secondary | ICD-10-CM

## 2016-07-21 DIAGNOSIS — Z7984 Long term (current) use of oral hypoglycemic drugs: Secondary | ICD-10-CM | POA: Diagnosis not present

## 2016-07-21 DIAGNOSIS — E871 Hypo-osmolality and hyponatremia: Secondary | ICD-10-CM | POA: Diagnosis not present

## 2016-07-21 DIAGNOSIS — J189 Pneumonia, unspecified organism: Secondary | ICD-10-CM | POA: Diagnosis present

## 2016-07-21 DIAGNOSIS — F17200 Nicotine dependence, unspecified, uncomplicated: Secondary | ICD-10-CM | POA: Diagnosis not present

## 2016-07-21 DIAGNOSIS — R197 Diarrhea, unspecified: Secondary | ICD-10-CM | POA: Diagnosis present

## 2016-07-21 DIAGNOSIS — I959 Hypotension, unspecified: Secondary | ICD-10-CM | POA: Diagnosis present

## 2016-07-21 DIAGNOSIS — E876 Hypokalemia: Secondary | ICD-10-CM | POA: Diagnosis present

## 2016-07-21 DIAGNOSIS — M109 Gout, unspecified: Secondary | ICD-10-CM | POA: Diagnosis not present

## 2016-07-21 DIAGNOSIS — R140 Abdominal distension (gaseous): Secondary | ICD-10-CM | POA: Diagnosis not present

## 2016-07-21 DIAGNOSIS — R0602 Shortness of breath: Secondary | ICD-10-CM

## 2016-07-21 DIAGNOSIS — J181 Lobar pneumonia, unspecified organism: Secondary | ICD-10-CM | POA: Diagnosis not present

## 2016-07-21 DIAGNOSIS — E872 Acidosis: Secondary | ICD-10-CM | POA: Diagnosis not present

## 2016-07-21 DIAGNOSIS — Y95 Nosocomial condition: Secondary | ICD-10-CM | POA: Diagnosis present

## 2016-07-21 DIAGNOSIS — Z720 Tobacco use: Secondary | ICD-10-CM | POA: Diagnosis present

## 2016-07-21 DIAGNOSIS — Z880 Allergy status to penicillin: Secondary | ICD-10-CM

## 2016-07-21 LAB — CBC WITH DIFFERENTIAL/PLATELET
BASOS ABS: 0 10*3/uL (ref 0.0–0.1)
BASOS PCT: 0 %
EOS PCT: 0 %
Eosinophils Absolute: 0 10*3/uL (ref 0.0–0.7)
HEMATOCRIT: 36.7 % — AB (ref 39.0–52.0)
HEMOGLOBIN: 12.6 g/dL — AB (ref 13.0–17.0)
LYMPHS ABS: 1.5 10*3/uL (ref 0.7–4.0)
LYMPHS PCT: 7 %
MCH: 28.9 pg (ref 26.0–34.0)
MCHC: 34.3 g/dL (ref 30.0–36.0)
MCV: 84.2 fL (ref 78.0–100.0)
MONOS PCT: 3 %
Monocytes Absolute: 0.6 10*3/uL (ref 0.1–1.0)
NEUTROS ABS: 18.8 10*3/uL — AB (ref 1.7–7.7)
Neutrophils Relative %: 90 %
Platelets: 185 10*3/uL (ref 150–400)
RBC: 4.36 MIL/uL (ref 4.22–5.81)
RDW: 15.8 % — ABNORMAL HIGH (ref 11.5–15.5)
WBC: 20.9 10*3/uL — ABNORMAL HIGH (ref 4.0–10.5)

## 2016-07-21 LAB — COMPREHENSIVE METABOLIC PANEL
ALK PHOS: 45 U/L (ref 38–126)
ALT: 17 U/L (ref 17–63)
AST: 24 U/L (ref 15–41)
Albumin: 3.4 g/dL — ABNORMAL LOW (ref 3.5–5.0)
Anion gap: 11 (ref 5–15)
BILIRUBIN TOTAL: 1.5 mg/dL — AB (ref 0.3–1.2)
BUN: 34 mg/dL — AB (ref 6–20)
CALCIUM: 8.6 mg/dL — AB (ref 8.9–10.3)
CO2: 21 mmol/L — ABNORMAL LOW (ref 22–32)
CREATININE: 1.89 mg/dL — AB (ref 0.61–1.24)
Chloride: 97 mmol/L — ABNORMAL LOW (ref 101–111)
GFR calc Af Amer: 42 mL/min — ABNORMAL LOW (ref >60)
GFR, EST NON AFRICAN AMERICAN: 36 mL/min — AB (ref >60)
GLUCOSE: 161 mg/dL — AB (ref 65–99)
POTASSIUM: 3.4 mmol/L — AB (ref 3.5–5.1)
Sodium: 129 mmol/L — ABNORMAL LOW (ref 135–145)
Total Protein: 6.4 g/dL — ABNORMAL LOW (ref 6.5–8.1)

## 2016-07-21 LAB — I-STAT CG4 LACTIC ACID, ED
LACTIC ACID, VENOUS: 2.46 mmol/L — AB (ref 0.5–1.9)
LACTIC ACID, VENOUS: 2.27 mmol/L — AB (ref 0.5–1.9)

## 2016-07-21 LAB — URINALYSIS, ROUTINE W REFLEX MICROSCOPIC
Bilirubin Urine: NEGATIVE
Glucose, UA: 50 mg/dL — AB
Hgb urine dipstick: NEGATIVE
Ketones, ur: NEGATIVE mg/dL
Leukocytes, UA: NEGATIVE
Nitrite: NEGATIVE
PH: 6 (ref 5.0–8.0)
Protein, ur: NEGATIVE mg/dL
SPECIFIC GRAVITY, URINE: 1.003 — AB (ref 1.005–1.030)

## 2016-07-21 LAB — TROPONIN I: Troponin I: 0.03 ng/mL (ref <0.03)

## 2016-07-21 MED ORDER — SODIUM CHLORIDE 0.9 % IV BOLUS (SEPSIS)
1000.0000 mL | Freq: Once | INTRAVENOUS | Status: AC
  Administered 2016-07-21: 1000 mL via INTRAVENOUS

## 2016-07-21 MED ORDER — LEVOFLOXACIN IN D5W 750 MG/150ML IV SOLN
750.0000 mg | Freq: Once | INTRAVENOUS | Status: AC
  Administered 2016-07-21: 750 mg via INTRAVENOUS
  Filled 2016-07-21: qty 150

## 2016-07-21 MED ORDER — VANCOMYCIN HCL IN DEXTROSE 1-5 GM/200ML-% IV SOLN
1000.0000 mg | Freq: Once | INTRAVENOUS | Status: AC
  Administered 2016-07-21: 1000 mg via INTRAVENOUS
  Filled 2016-07-21: qty 200

## 2016-07-21 MED ORDER — VANCOMYCIN HCL 10 G IV SOLR: 1500.0000 mg | INTRAVENOUS | Status: DC

## 2016-07-21 MED ORDER — LEVOFLOXACIN IN D5W 750 MG/150ML IV SOLN: 750.0000 mg | INTRAVENOUS | Status: DC

## 2016-07-21 MED ORDER — SODIUM CHLORIDE 0.9 % IV SOLN
1000.0000 mL | INTRAVENOUS | Status: DC
  Administered 2016-07-21 (×2): 1000 mL via INTRAVENOUS

## 2016-07-21 NOTE — ED Notes (Signed)
Provider at bedside

## 2016-07-21 NOTE — ED Notes (Signed)
Pt admitting orders for MedSurg, called MD and advised tele admitting MD states he will change bed order.

## 2016-07-21 NOTE — ED Triage Notes (Signed)
Pt BIB [facility] r/t productive cough X2 days with yellow sputum. Pt also c/o fever and SOB. He reports that he was treated for pneumonia a few weeks ago and has been sick since. EMS gave 1 nitro PTA for CP. Pt reports CP with coughing.

## 2016-07-21 NOTE — H&P (Signed)
[facility] Admission History and Physical Service Pager: [phone]  Patient name: Ernest White Medical record number: [id number] Date of birth: 12/31/1955 Age: 61 y.o. Gender: male  Primary Care Provider: Winfield Bing, DO Consultants: None  Code Status: Full (obtained on admission)  Chief Complaint: Feeling unwell  Assessment and Plan: Ernest White is a 61 y.o. male presenting with right sided chest pain, shortness of breath, nausea, emesis, diarrhea, abdominal pain, fever, chills and fatigue.  PMH is significant for COPD, diabetes and  tobacco use disorder.  Sepsis/Pneumonia: patient with signs and symptoms of pneumonia. Exam with crackles over right lung field. Chest x-ray with mass-like focus of consolidation in the peripheral right mid lung. Concern for HCAP given his recent hospitalization about 2.5 months ago for pneumonia. Meets sepsis criteria based on his initial vital signs with qSOFA of 2. He also has lactic acidosis to 2.46, which could also be due to dehydration and AKI. No increased work of breathing or wheeze to suspect COPD exacerbation. Doesn't appear fluid overloaded to think of CHF. PE Well's score 1.5 (tachycardia). However, his tachycardia is likely due to pneumonia than PE. ACS is unlikely with nonanginal chest pain but EKG with TWI.  -Admit to MedSurg. Attending Dr. Nori Riis -S/p  vancomycin, Levaquin and 3L of NS bolus in ED with subsequent improvement in his blood pressure.  -Continue vancomycin and Levaquin -We'll add meropenem until blood cultures are negative. He is allergic to penicillin -IV NS at 125 mL/h -Trend lactic acid -Follow up blood cultures.  -Urine Legionella and strep Ag -HIV -COPD management as below -We'll rule out ACS as below. -Oxygen as needed -Needs follow-up chest x-ray in 4-6 weeks as an outpatient  COPD: No signs of exacerbation. On Dulera at home.  -Continue home Dulera -DuoNeb every 4 hours when  necessary -We'll add Spiriva -Oxygen as needed  AKI: Serum creatinine 1.89 on admission. Baseline 0.9. Likely due to dehydration from emesis and diarrhea in the setting of acute illness. -Status post 3 L of normal saline bolus -Continue IV fluid as above -Repeat BMP in a.m.  Hypovolemic Hyponatremia: Na 129. Likely due to GI loss from diarrhea. His story of pneumonia, diarrhea and hyponatremia fits into Legionella -Status post 3 L of normal saline bolus in ED -Continue IV fluid as above -Urine Legionella antigen as above -BMP in the morning  Hypokalemia: K3.4. Likely due to GI loss -KCl 77mq x3 -Check magnesium -BMP in the morning  Diabetes: Last A1c 6.8 on 3/23. Recently started on metformin -Hold her metformin -SSI-renal  Hypertension: Slightly hypotensive on arrival.  -Hold home lisinopril and hydrochlorothiazide  Tobacco use disorder: Smokes a pack a day. Recently started on Chantix -Nicotine patch  FEN/GI:  -IV fluid as above -Car modified diet  Prophylaxis: Lovenox  [place]   History of Present Illness:  Ernest AGUINIGAis a 61y.o. male presenting with chest pain, shortness of breath, nausea, emesis, diarrhea, abdominal pain, fever, chills and fatigue for 2-3 days.  Patient reports the above symptoms for 2-3 days. He also reports productive cough with yellowish sputum with blood stinge. He denies frank hemoptysis. Chest pain is sharp in nature and right-sided. Pain is aggravated by cough and movement particularly when he lies on the right. He also reports shortness of breath. Reports about 3-4 emesis today. Emesis was without blood or bile. He also reports 3-4 watery diarrhea today which is also nonbloody. Abdominal pain  is diffuse tenderness is usually with cough.   Off note, patient was hospitalized at [facility] in [place] for pneumonia about 2.5 months ago. He was discharged on antibiotic  but doesn't remember the name. He reports completing the course. He was advised to follow up in 2 weeks but came to [facility] and didn't follow-up. He says he has been coughing since that hospitalization. He also reports night sweats. Denies unintentional weight loss. He denies incarceration or travel outside [place].  He denies headache, photophobia, vision changes, neck stiffness, orthopnea, edema, dysuria, calf swelling or pain & history of DVT. Brother with history of DVT. Denies recent travel.  He has smoked a pack a year since he was 61 years of age. Hasn't drank in a month. Denies recreational drug use  ED course: Vital signs significant for tachycardia and tachypnea and borderline blood pressure to 94/54. Desated to 91% and put on 3L nasal cannula. CMP with Na to 129, K 3.4, bicarbonate 21, creatinine 1.89) baseline 0.95). WBC 21 with left shift. Lactic acid 2.46. Troponin 0.03. EKG with sinus tachycardia and TWI in lateral leads. CXR with mass-like focus of consolidation in the peripheral right mid lung concerning for pneumonia based on clinical picture.  Review Of Systems:   ROS Review of systems negative except for pertinent positives and negatives in history of present illness above.   Patient Active Problem List   Diagnosis Date Noted  . Frequent No-show for appointment 07/20/2016  . Tobacco use disorder 06/14/2016  . External hemorrhoids 06/03/2016  . Syncope and collapse 12/27/2015  . Diabetes ([place]) 12/27/2015  . Chronic obstructive airway disease ([place]) 12/23/2015  . Primary hypertension 12/15/2015    Past Medical History: Past Medical History:  Diagnosis Date  . Arthritis   . COPD (chronic obstructive pulmonary disease) ([place])   . Diabetes mellitus without complication ([place])   . Gout   . Hypertension     Past Surgical History: Past Surgical History:  Procedure Laterality Date  . APPENDECTOMY    . HERNIA REPAIR      Social History: Social History  Substance Use  Topics  . Smoking status: Current Every Day Smoker    Packs/day: 1.00    Years: 48.00    Types: Cigarettes  . Smokeless tobacco: Never Used     Comment: Works at Company secretary  . Alcohol use 0.6 oz/week    1 Cans of beer per week     Comment: "moderately"   Additional social history: per HPI  Please also refer to relevant sections of EMR.  Family History: No family history on file. (If not completed, MUST add something in)  Allergies and Medications: Allergies  Allergen Reactions  . Penicillins Anaphylaxis   No current facility-administered medications on file prior to encounter.    Current Outpatient Prescriptions on File Prior to Encounter  Medication Sig Dispense Refill  . albuterol (PROVENTIL HFA;VENTOLIN HFA) 108 (90 Base) MCG/ACT inhaler Inhale 2 puffs into the lungs every 6 (six) hours as needed for wheezing or shortness of breath. 1 Inhaler 2  . BAYER MICROLET LANCETS lancets Use as instructed 100 each 12  . Blood Glucose Monitoring Suppl (ACCU-CHEK AVIVA PLUS) w/Device KIT Use as directed to check blood glucose up to twice daily. E11.9.  May substitute for formulary preferred. 1 kit 0  . glucose blood (ACCU-CHEK AVIVA PLUS) test strip Use as directed to check blood glucose up to twice daily. E11.9.  May substitute for formulary preferred. 100 each  12  . lisinopril (PRINIVIL,ZESTRIL) 40 MG tablet Take 1 tablet (40 mg total) by mouth daily. 90 tablet 2  . metFORMIN (GLUCOPHAGE) 500 MG tablet Take 1 tablet (500 mg total) by mouth 2 (two) times daily with a meal. 180 tablet 3  . mometasone-formoterol (DULERA) 200-5 MCG/ACT AERO Inhale 2 puffs into the lungs 2 (two) times daily. 1 Inhaler 0  . varenicline (CHANTIX CONTINUING MONTH PAK) 1 MG tablet Take 1 tablet (1 mg total) by mouth 2 (two) times daily. 60 tablet 0  . varenicline (CHANTIX STARTING MONTH PAK) 0.5 MG X 11 & 1 MG X 42 tablet Take one 0.5 mg tablet once daily for 3 days, then increase to one 0.5 mg tablet BID for 4  days, then increase to one 1 mg tablet BID. 53 tablet 0    Objective: BP 102/67   Pulse (!) 121   Temp 100 F (37.8 C) (Oral)   Resp (!) 22   Ht 5' 11" (1.803 m)   Wt 280 lb (127 kg)   SpO2 96%   BMI 39.05 kg/m  Exam: GEN: some distress from cough and pain. Head: normocephalic and atraumatic  Eyes: conjunctiva without injection, sclera anicteric Oropharynx: mmm without erythema or exudation HEM: negative for cervical or periauricular lymphadenopathies CVS: RRR, nl s1 & s2, no murmurs, no edema,  2+ DP & PT pulses bilaterally RESP: no IWOB, good air movement bilaterally, crackles over right lung posteriorly.  GI: BS present & normal, mild tenderness to palpation,  no guarding, no rebound MSK: Some tenderness to palpation over his right chest SKIN: no apparent skin lesion NEURO: alert and oiented appropriately, no gross defecits  PSYCH: euthymic mood with congruent affect  Labs and Imaging: CBC BMET   Recent Labs Lab 07/21/16 1940  WBC 20.9*  HGB 12.6*  HCT 36.7*  PLT 185    Recent Labs Lab 07/21/16 1940  NA 129*  K 3.4*  CL 97*  CO2 21*  BUN 34*  CREATININE 1.89*  GLUCOSE 161*  CALCIUM 8.6*     Dg Chest 2 View  Result Date: 07/21/2016 CLINICAL DATA:  Cough, fever EXAM: CHEST  2 VIEW COMPARISON:  06/28/2016 chest radiograph. FINDINGS: Stable cardiomediastinal silhouette with normal heart size. No pneumothorax. No pleural effusion. New masslike focus of consolidation in the peripheral right mid lung. Clear left lung. No pulmonary edema. Hyperinflated lungs. IMPRESSION: 1. Masslike focus of consolidation in the peripheral right mid lung is new since 06/28/2016. The rapid onset of this finding is most compatible with pneumonia. No pleural effusion. Recommend follow-up PA and lateral post treatment chest radiographs in 4-6 weeks. 2. Hyperinflated lungs, suggesting COPD. Electronically Signed   By: Ilona Sorrel M.D.   On: 07/21/2016 19:36    [name],  MD 07/21/2016, 8:45 PM PGY-2, [facility] pager: [phone], text pages welcome

## 2016-07-21 NOTE — ED Notes (Signed)
MD made aware of critical lactic acid value 

## 2016-07-21 NOTE — ED Provider Notes (Signed)
[facility] DEPT Provider Note   CSN: [id number] Arrival date & time: 07/21/16  [place]     History   Chief Complaint Chief Complaint  Patient presents with  . Shortness of Breath    HPI Ernest White is a 61 y.o. male.  61 year old male with prior history of COPD as well as pneumonia presents with 2 days of productive cough of green yellow sputum. He is also noted myalgias as well as fever. Has had some posttussive emesis as well as mild watery diarrhea. Patient denies any photophobia or neck pain or headache. Has had centralized chest discomfort that's worse with coughing. Denies any pleuritic component to this. No leg pain or swelling. No anginal quality. EMS called and given nitroglycerin which did not change his symptoms.      Past Medical History:  Diagnosis Date  . Arthritis   . COPD (chronic obstructive pulmonary disease) ([place])   . Diabetes mellitus without complication ([place])   . Gout   . Hypertension     Patient Active Problem List   Diagnosis Date Noted  . Frequent No-show for appointment 07/20/2016  . Tobacco use disorder 06/14/2016  . External hemorrhoids 06/03/2016  . Syncope and collapse 12/27/2015  . Diabetes ([place]) 12/27/2015  . Chronic obstructive airway disease ([place]) 12/23/2015  . Primary hypertension 12/15/2015    Past Surgical History:  Procedure Laterality Date  . APPENDECTOMY    . HERNIA REPAIR         Home Medications    Prior to Admission medications   Medication Sig Start Date End Date Taking? Authorizing Provider  albuterol (PROVENTIL HFA;VENTOLIN HFA) 108 (90 Base) MCG/ACT inhaler Inhale 2 puffs into the lungs every 6 (six) hours as needed for wheezing or shortness of breath. 06/01/16   [place] Bing, DO  BAYER MICROLET LANCETS lancets Use as instructed 06/13/16   [place] Bing, DO  Blood Glucose Monitoring Suppl (ACCU-CHEK AVIVA PLUS) w/Device KIT Use as directed to check blood glucose up to twice daily. E11.9.  May  substitute for formulary preferred. 06/14/16   Zenia Resides, MD  glucose blood (ACCU-CHEK AVIVA PLUS) test strip Use as directed to check blood glucose up to twice daily. E11.9.  May substitute for formulary preferred. 06/14/16   Zenia Resides, MD  lisinopril (PRINIVIL,ZESTRIL) 40 MG tablet Take 1 tablet (40 mg total) by mouth daily. 05/15/16   [place] Bing, DO  metFORMIN (GLUCOPHAGE) 500 MG tablet Take 1 tablet (500 mg total) by mouth 2 (two) times daily with a meal. 06/01/16   [place] Bing, DO  mometasone-formoterol (DULERA) 200-5 MCG/ACT AERO Inhale 2 puffs into the lungs 2 (two) times daily. 12/27/15   [place] Bing, DO  varenicline (CHANTIX CONTINUING MONTH PAK) 1 MG tablet Take 1 tablet (1 mg total) by mouth 2 (two) times daily. 06/14/16   Zenia Resides, MD  varenicline (CHANTIX STARTING MONTH PAK) 0.5 MG X 11 & 1 MG X 42 tablet Take one 0.5 mg tablet once daily for 3 days, then increase to one 0.5 mg tablet BID for 4 days, then increase to one 1 mg tablet BID. 06/14/16   Hensel, Jamal Collin, MD    Family History No family history on file.  Social History Social History  Substance Use Topics  . Smoking status: Current Every Day Smoker    Packs/day: 1.00    Years: 48.00    Types: Cigarettes  . Smokeless tobacco: Never Used     Comment: Works  at tobacco plant  . Alcohol use 0.6 oz/week    1 Cans of beer per week     Comment: "moderately"     Allergies   Penicillins   Review of Systems Review of Systems  All other systems reviewed and are negative.    Physical Exam Updated Vital Signs Temp 100 F (37.8 C) (Oral)   Ht 5' 11"  (1.803 m)   Wt 127 kg   SpO2 91% Comment: upon arrival on room air   BMI 39.05 kg/m   Physical Exam  Constitutional: He is oriented to person, place, and time. He appears well-developed and well-nourished.  Non-toxic appearance. No distress.  HENT:  Head: Normocephalic and atraumatic.  Eyes: Conjunctivae, EOM and lids are normal.  Pupils are equal, round, and reactive to light.  Neck: Normal range of motion. Neck supple. No tracheal deviation present. No thyroid mass present.  Cardiovascular: Normal rate, regular rhythm and normal heart sounds.  Exam reveals no gallop.   No murmur heard. Pulmonary/Chest: Effort normal. No stridor. No respiratory distress. He has decreased breath sounds in the right lower field and the left lower field. He has no wheezes. He has rhonchi in the right lower field and the left lower field. He has no rales.  Abdominal: Soft. Normal appearance and bowel sounds are normal. He exhibits no distension. There is no tenderness. There is no rebound and no CVA tenderness.  Musculoskeletal: Normal range of motion. He exhibits no edema or tenderness.  Neurological: He is alert and oriented to person, place, and time. He has normal strength. No cranial nerve deficit or sensory deficit. GCS eye subscore is 4. GCS verbal subscore is 5. GCS motor subscore is 6.  Skin: Skin is warm and dry. No abrasion and no rash noted.  Psychiatric: He has a normal mood and affect. His speech is normal and behavior is normal.  Nursing note and vitals reviewed.    ED Treatments / Results  Labs (all labs ordered are listed, but only abnormal results are displayed) Labs Reviewed  CULTURE, BLOOD (ROUTINE X 2)  CULTURE, BLOOD (ROUTINE X 2)  COMPREHENSIVE METABOLIC PANEL  CBC WITH DIFFERENTIAL/PLATELET  URINALYSIS, ROUTINE W REFLEX MICROSCOPIC  TROPONIN I  I-STAT CG4 LACTIC ACID, ED    EKG  EKG Interpretation  Date/Time:  Saturday Jul 21 2016 18:58:47 EDT Ventricular Rate:  117 PR Interval:    QRS Duration: 78 QT Interval:  288 QTC Calculation: 402 R Axis:   44 Text Interpretation:  Sinus tachycardia Nonspecific T abnormalities, lateral leads Confirmed by Ilya Neely  MD, Nathyn Luiz ([id number]) on 07/21/2016 7:40:59 PM       Radiology No results found.  Procedures Procedures (including critical care  time)  Medications Ordered in ED Medications  0.9 %  sodium chloride infusion (not administered)     Initial Impression / Assessment and Plan / ED Course  I have reviewed the triage vital signs and the nursing notes.  Pertinent labs & imaging results that were available during my care of the patient were reviewed by me and considered in my medical decision making (see chart for details).     Patient evidence of pneumonia on chest x-ray. Will start on IV Avelox admit to the family practice teaching service  Final Clinical Impressions(s) / ED Diagnoses   Final diagnoses:  None    New Prescriptions New Prescriptions   No medications on file     Lacretia Leigh, MD 07/21/16 2059

## 2016-07-21 NOTE — Progress Notes (Signed)
Pharmacy Antibiotic Note  Ernest White is a 61 y.o. male admitted on 07/21/2016 with pneumonia.  Pharmacy has been consulted for vancomycin and levofloxacin dosing.  Normalized CrCl ~40-3645mL/min.  Plan: Vancomycin 2g IV total (two separate 1g IV orders) as load, then 1500mg  IV q24h Levofloxacin 750mg  IV q24h Follow c/s, clinical progression, renal function, level PRN   Height: 5\' 11"  (180.3 cm) Weight: 280 lb (127 kg) IBW/kg (Calculated) : 75.3  Temp (24hrs), Avg:100 F (37.8 C), Min:100 F (37.8 C), Max:100 F (37.8 C)   Recent Labs Lab 07/21/16 1940 07/21/16 2001  WBC 20.9*  --   LATICACIDVEN  --  2.46*    CrCl cannot be calculated (Patient's most recent lab result is older than the maximum 21 days allowed.).    Allergies  Allergen Reactions  . Penicillins Anaphylaxis    Antimicrobials this admission: Vancomycin 5/12 >>  Levofloxacin 5/12 >>   Dose adjustments this admission: n/a  Microbiology results: 5/12 BCx:    Thank you for allowing pharmacy to be a part of this patient's care.  Clyde Upshaw 07/21/2016 8:20 PM

## 2016-07-22 ENCOUNTER — Inpatient Hospital Stay (HOSPITAL_COMMUNITY): Payer: Medicaid Other

## 2016-07-22 DIAGNOSIS — N179 Acute kidney failure, unspecified: Secondary | ICD-10-CM | POA: Diagnosis present

## 2016-07-22 DIAGNOSIS — J181 Lobar pneumonia, unspecified organism: Secondary | ICD-10-CM

## 2016-07-22 DIAGNOSIS — I10 Essential (primary) hypertension: Secondary | ICD-10-CM

## 2016-07-22 DIAGNOSIS — F17200 Nicotine dependence, unspecified, uncomplicated: Secondary | ICD-10-CM

## 2016-07-22 DIAGNOSIS — J449 Chronic obstructive pulmonary disease, unspecified: Secondary | ICD-10-CM

## 2016-07-22 DIAGNOSIS — E119 Type 2 diabetes mellitus without complications: Secondary | ICD-10-CM

## 2016-07-22 LAB — PROCALCITONIN: Procalcitonin: 14.49 ng/mL

## 2016-07-22 LAB — BASIC METABOLIC PANEL
Anion gap: 8 (ref 5–15)
BUN: 26 mg/dL — AB (ref 6–20)
CHLORIDE: 104 mmol/L (ref 101–111)
CO2: 22 mmol/L (ref 22–32)
Calcium: 7.8 mg/dL — ABNORMAL LOW (ref 8.9–10.3)
Creatinine, Ser: 1.39 mg/dL — ABNORMAL HIGH (ref 0.61–1.24)
GFR calc Af Amer: >60 mL/min (ref >60)
GFR, EST NON AFRICAN AMERICAN: 53 mL/min — AB (ref >60)
Glucose, Bld: 142 mg/dL — ABNORMAL HIGH (ref 65–99)
POTASSIUM: 3.5 mmol/L (ref 3.5–5.1)
SODIUM: 134 mmol/L — AB (ref 135–145)

## 2016-07-22 LAB — TROPONIN I
Troponin I: 0.03 ng/mL (ref <0.03)
Troponin I: <0.03 ng/mL (ref <0.03)

## 2016-07-22 LAB — CBC
HCT: 33.8 % — ABNORMAL LOW (ref 39.0–52.0)
Hemoglobin: 11.5 g/dL — ABNORMAL LOW (ref 13.0–17.0)
MCH: 28.7 pg (ref 26.0–34.0)
MCHC: 34 g/dL (ref 30.0–36.0)
MCV: 84.3 fL (ref 78.0–100.0)
Platelets: 162 10*3/uL (ref 150–400)
RBC: 4.01 MIL/uL — AB (ref 4.22–5.81)
RDW: 16.3 % — AB (ref 11.5–15.5)
WBC: 25.6 10*3/uL — AB (ref 4.0–10.5)

## 2016-07-22 LAB — BLOOD CULTURE ID PANEL (REFLEXED)
Acinetobacter baumannii: NOT DETECTED
CANDIDA KRUSEI: NOT DETECTED
CANDIDA PARAPSILOSIS: NOT DETECTED
CANDIDA TROPICALIS: NOT DETECTED
Candida albicans: NOT DETECTED
Candida glabrata: NOT DETECTED
ENTEROCOCCUS SPECIES: NOT DETECTED
ESCHERICHIA COLI: NOT DETECTED
Enterobacter cloacae complex: NOT DETECTED
Enterobacteriaceae species: NOT DETECTED
HAEMOPHILUS INFLUENZAE: NOT DETECTED
KLEBSIELLA OXYTOCA: NOT DETECTED
Klebsiella pneumoniae: NOT DETECTED
Listeria monocytogenes: NOT DETECTED
Neisseria meningitidis: NOT DETECTED
PROTEUS SPECIES: NOT DETECTED
Pseudomonas aeruginosa: NOT DETECTED
SERRATIA MARCESCENS: NOT DETECTED
STAPHYLOCOCCUS AUREUS BCID: NOT DETECTED
STAPHYLOCOCCUS SPECIES: NOT DETECTED
STREPTOCOCCUS SPECIES: DETECTED — AB
Streptococcus agalactiae: NOT DETECTED
Streptococcus pneumoniae: DETECTED — AB
Streptococcus pyogenes: NOT DETECTED

## 2016-07-22 LAB — GLUCOSE, CAPILLARY
GLUCOSE-CAPILLARY: 105 mg/dL — AB (ref 65–99)
GLUCOSE-CAPILLARY: 99 mg/dL (ref 65–99)
GLUCOSE-CAPILLARY: 100 mg/dL — AB (ref 65–99)
Glucose-Capillary: 128 mg/dL — ABNORMAL HIGH (ref 65–99)

## 2016-07-22 LAB — LACTIC ACID, PLASMA: Lactic Acid, Venous: 1.1 mmol/L (ref 0.5–1.9)

## 2016-07-22 LAB — STREP PNEUMONIAE URINARY ANTIGEN: Strep Pneumo Urinary Antigen: POSITIVE — AB

## 2016-07-22 LAB — BRAIN NATRIURETIC PEPTIDE: B Natriuretic Peptide: 111.4 pg/mL — ABNORMAL HIGH (ref 0.0–100.0)

## 2016-07-22 LAB — MAGNESIUM: Magnesium: 1.8 mg/dL (ref 1.7–2.4)

## 2016-07-22 LAB — HIV ANTIBODY (ROUTINE TESTING W REFLEX): HIV Screen 4th Generation wRfx: NONREACTIVE

## 2016-07-22 MED ORDER — PNEUMOCOCCAL VAC POLYVALENT 25 MCG/0.5ML IJ INJ
0.5000 mL | INJECTION | INTRAMUSCULAR | Status: AC
  Administered 2016-07-23: 0.5 mL via INTRAMUSCULAR
  Filled 2016-07-22: qty 0.5

## 2016-07-22 MED ORDER — NICOTINE 21 MG/24HR TD PT24
21.0000 mg | MEDICATED_PATCH | Freq: Every day | TRANSDERMAL | Status: DC
  Administered 2016-07-22 – 2016-07-25 (×4): 21 mg via TRANSDERMAL
  Filled 2016-07-22 (×4): qty 1

## 2016-07-22 MED ORDER — TRAMADOL HCL 50 MG PO TABS
50.0000 mg | ORAL_TABLET | Freq: Four times a day (QID) | ORAL | Status: DC | PRN
  Administered 2016-07-22 – 2016-07-24 (×5): 50 mg via ORAL
  Filled 2016-07-22 (×5): qty 1

## 2016-07-22 MED ORDER — TRAZODONE HCL 50 MG PO TABS
50.0000 mg | ORAL_TABLET | Freq: Every evening | ORAL | Status: DC | PRN
  Administered 2016-07-23 – 2016-07-24 (×2): 50 mg via ORAL
  Filled 2016-07-22 (×2): qty 1

## 2016-07-22 MED ORDER — ENOXAPARIN SODIUM 40 MG/0.4ML ~~LOC~~ SOLN
40.0000 mg | SUBCUTANEOUS | Status: DC
  Administered 2016-07-22 – 2016-07-25 (×3): 40 mg via SUBCUTANEOUS
  Filled 2016-07-22 (×4): qty 0.4

## 2016-07-22 MED ORDER — TRAMADOL HCL 50 MG PO TABS
50.0000 mg | ORAL_TABLET | Freq: Two times a day (BID) | ORAL | Status: DC | PRN
  Administered 2016-07-22: 50 mg via ORAL
  Filled 2016-07-22: qty 1

## 2016-07-22 MED ORDER — PANTOPRAZOLE SODIUM 40 MG PO TBEC
40.0000 mg | DELAYED_RELEASE_TABLET | Freq: Every day | ORAL | Status: DC
  Administered 2016-07-22 – 2016-07-25 (×4): 40 mg via ORAL
  Filled 2016-07-22 (×4): qty 1

## 2016-07-22 MED ORDER — TIOTROPIUM BROMIDE MONOHYDRATE 18 MCG IN CAPS
18.0000 ug | ORAL_CAPSULE | Freq: Every day | RESPIRATORY_TRACT | Status: DC
  Administered 2016-07-22 – 2016-07-25 (×4): 18 ug via RESPIRATORY_TRACT
  Filled 2016-07-22: qty 5

## 2016-07-22 MED ORDER — SODIUM CHLORIDE 0.9 % IV SOLN
1.0000 g | Freq: Three times a day (TID) | INTRAVENOUS | Status: DC
  Administered 2016-07-22 – 2016-07-24 (×7): 1 g via INTRAVENOUS
  Filled 2016-07-22 (×8): qty 1

## 2016-07-22 MED ORDER — ONDANSETRON HCL 4 MG PO TABS: 4.0000 mg | ORAL_TABLET | Freq: Four times a day (QID) | ORAL | Status: DC | PRN

## 2016-07-22 MED ORDER — IPRATROPIUM-ALBUTEROL 0.5-2.5 (3) MG/3ML IN SOLN
3.0000 mL | RESPIRATORY_TRACT | Status: DC | PRN
  Administered 2016-07-22: 3 mL via RESPIRATORY_TRACT
  Filled 2016-07-22: qty 3

## 2016-07-22 MED ORDER — GI COCKTAIL ~~LOC~~
30.0000 mL | Freq: Once | ORAL | Status: AC
  Administered 2016-07-22: 30 mL via ORAL
  Filled 2016-07-22: qty 30

## 2016-07-22 MED ORDER — POTASSIUM CHLORIDE 10 MEQ/100ML IV SOLN
10.0000 meq | INTRAVENOUS | Status: AC
  Administered 2016-07-22 (×3): 10 meq via INTRAVENOUS
  Filled 2016-07-22 (×3): qty 100

## 2016-07-22 MED ORDER — MOMETASONE FURO-FORMOTEROL FUM 200-5 MCG/ACT IN AERO
2.0000 | INHALATION_SPRAY | Freq: Two times a day (BID) | RESPIRATORY_TRACT | Status: DC
  Administered 2016-07-22 – 2016-07-25 (×7): 2 via RESPIRATORY_TRACT
  Filled 2016-07-22: qty 8.8

## 2016-07-22 MED ORDER — INSULIN ASPART 100 UNIT/ML ~~LOC~~ SOLN
0.0000 [IU] | Freq: Three times a day (TID) | SUBCUTANEOUS | Status: DC
Start: 2016-07-22 — End: 2016-07-25
  Administered 2016-07-22: 1 [IU] via SUBCUTANEOUS
  Administered 2016-07-23: 2 [IU] via SUBCUTANEOUS
  Administered 2016-07-24: 1 [IU] via SUBCUTANEOUS
  Administered 2016-07-25: 2 [IU] via SUBCUTANEOUS

## 2016-07-22 MED ORDER — ONDANSETRON HCL 4 MG/2ML IJ SOLN: 4.0000 mg | Freq: Four times a day (QID) | INTRAMUSCULAR | Status: DC | PRN

## 2016-07-22 MED ORDER — OXYCODONE HCL 5 MG PO TABS
5.0000 mg | ORAL_TABLET | Freq: Once | ORAL | Status: AC
  Administered 2016-07-22: 5 mg via ORAL
  Filled 2016-07-22: qty 1

## 2016-07-22 MED ORDER — ACETAMINOPHEN 325 MG PO TABS: 650.0000 mg | ORAL_TABLET | Freq: Four times a day (QID) | ORAL | Status: DC | PRN

## 2016-07-22 MED ORDER — VANCOMYCIN HCL IN DEXTROSE 1-5 GM/200ML-% IV SOLN
1000.0000 mg | Freq: Two times a day (BID) | INTRAVENOUS | Status: DC
  Administered 2016-07-22 – 2016-07-23 (×2): 1000 mg via INTRAVENOUS
  Filled 2016-07-22 (×5): qty 200

## 2016-07-22 MED ORDER — ACETAMINOPHEN 650 MG RE SUPP: 650.0000 mg | Freq: Four times a day (QID) | RECTAL | Status: DC | PRN

## 2016-07-22 MED ORDER — SODIUM CHLORIDE 0.9 % IV SOLN
INTRAVENOUS | Status: DC
  Administered 2016-07-22 – 2016-07-23 (×2): via INTRAVENOUS

## 2016-07-22 NOTE — Progress Notes (Signed)
[facility]PTS Social Note  Came to see Mr. Jon BillingsMorrison given his hospitalization for RML strep pneumo and concomitant bacteremia. Clinically he appears to be doing well while on broad-spectrum antibiotics. Able to carry on conversation and laugh. No oxygen requirement at present. Discussed the importance of making his outpatient appointments given he missed his last 3 and consequently, missed his opportunity for a strep pneumo vaccine. Patient states he has recently moved into a new apartment and has an appointment this Thursday. Will continue to monitor from afar. Appreciate the great care FPTS is providing.  In addition: 1. Advised patient to follow recommendations by Dr. Raymondo BandKoval for smoking cessation as this increases risk of pneumonia. Has Chantix at home but has not taken due to possibility of side effects. 2. Patient states he continues to take HCTZ. Was discontinued due to possibility of exacerbating gout. If patient becomes hypertensive, recommend initiating amlodipine 5 mg daily as discussed with Dr. Raymondo BandKoval during last appointment. Instruct patient to discontinue HCTZ on discharge. 3. Optimally, would benefit from statin therapy given ASCVD risk. Could consider starting given normal liver enzymes on admission?  Wendee BeaversMcMullen, David J, DO 07/22/2016, 6:07 PM PGY-1, [facility]

## 2016-07-22 NOTE — ED Notes (Addendum)
Pt reports new onset sharp right CP. Admitting provider paged and aware, will place new orders. EDP also aware, aware of pt rhythm

## 2016-07-22 NOTE — Progress Notes (Signed)
Pharmacy Antibiotic Note  Ernest White is a 61 y.o. male admitted on 07/21/2016 with pneumonia.  Pharmacy has been consulted for Merrem dosing. Pt already on vancomycin/levaquin. MD wishes to add Merrem until cultures are back.   Plan: Merrem 1g IV q8h Already on vancomycin/levaquin  De-escalate as able  Height: 5\' 11"  (180.3 cm) Weight: 267 lb 9.6 oz (121.4 kg) IBW/kg (Calculated) : 75.3  Temp (24hrs), Avg:98.8 F (37.1 C), Min:97.9 F (36.6 C), Max:100 F (37.8 C)   Recent Labs Lab 07/21/16 1940 07/21/16 2001 07/21/16 2349  WBC 20.9*  --   --   CREATININE 1.89*  --   --   LATICACIDVEN  --  2.46* 2.27*    Estimated Creatinine Clearance: 53.7 mL/min (A) (by C-G formula based on SCr of 1.89 mg/dL (H)).    Allergies  Allergen Reactions  . Penicillins Anaphylaxis    Has patient had a PCN reaction causing immediate rash, facial/tongue/throat swelling, SOB or lightheadedness with hypotension: Yes Has patient had a PCN reaction causing severe rash involving mucus membranes or skin necrosis: No Has patient had a PCN reaction that required hospitalization pt was hospitalized at time of reaction Has patient had a PCN reaction occurring within the last 10 years: No If all of the above answers are "NO", then may proceed with Cephalosporin use.  . Aspirin Other (See Comments)    Stomach pain     Abran DukeLedford, Taegan Haider 07/22/2016 2:18 AM

## 2016-07-22 NOTE — Progress Notes (Signed)
Pt c/o of SOB and right chest pain, Pt very uncomfortable and wanted to rest and get some sleep. Pt said he has not gotten any sleep. Made an atempt to finish admission documentation, but could not at this time.

## 2016-07-22 NOTE — Progress Notes (Signed)
PHARMACY - PHYSICIAN COMMUNICATION CRITICAL VALUE ALERT - BLOOD CULTURE IDENTIFICATION (BCID)  Results for orders placed or performed during the hospital encounter of 07/21/16  Blood Culture ID Panel (Reflexed) (Collected: 07/21/2016  7:40 PM)  Result Value Ref Range   Enterococcus species NOT DETECTED NOT DETECTED   Listeria monocytogenes NOT DETECTED NOT DETECTED   Staphylococcus species NOT DETECTED NOT DETECTED   Staphylococcus aureus NOT DETECTED NOT DETECTED   Streptococcus species DETECTED (A) NOT DETECTED   Streptococcus agalactiae NOT DETECTED NOT DETECTED   Streptococcus pneumoniae DETECTED (A) NOT DETECTED   Streptococcus pyogenes NOT DETECTED NOT DETECTED   Acinetobacter baumannii NOT DETECTED NOT DETECTED   Enterobacteriaceae species NOT DETECTED NOT DETECTED   Enterobacter cloacae complex NOT DETECTED NOT DETECTED   Escherichia coli NOT DETECTED NOT DETECTED   Klebsiella oxytoca NOT DETECTED NOT DETECTED   Klebsiella pneumoniae NOT DETECTED NOT DETECTED   Proteus species NOT DETECTED NOT DETECTED   Serratia marcescens NOT DETECTED NOT DETECTED   Haemophilus influenzae NOT DETECTED NOT DETECTED   Neisseria meningitidis NOT DETECTED NOT DETECTED   Pseudomonas aeruginosa NOT DETECTED NOT DETECTED   Candida albicans NOT DETECTED NOT DETECTED   Candida glabrata NOT DETECTED NOT DETECTED   Candida krusei NOT DETECTED NOT DETECTED   Candida parapsilosis NOT DETECTED NOT DETECTED   Candida tropicalis NOT DETECTED NOT DETECTED    Name of physician (or Provider) Contacted: Family medicine   Changes to prescribed antibiotics required: None  Pt with 2/2 blood cultures positive for strep pneumoniae. Allergy to penicillin: anaphylaxis, no B-lactams documented as given in our system Continue vancomycin, check trough at steady state   Baldemar FridayMasters, Barney Gertsch M 07/22/2016  12:02 PM

## 2016-07-22 NOTE — Progress Notes (Signed)
Family Medicine Teaching Service Daily Progress Note Intern Pager: [phone]  Patient name: Ernest White Medical record number: [id number] Date of birth: [birth year] Age: 61 y.o. Gender: male  Primary Care Provider: Wendee Beavers, DO Consultants: none Code Status: Full  Pt Overview and Major Events to Date:  5/12: Admitted  Assessment and Plan: Ernest White is a 61 y.o. male presenting with right sided chest pain, shortness of breath, nausea, emesis, diarrhea, abdominal pain, fever, chills and fatigue.  PMH is significant for COPD, diabetes and  tobacco use disorder.  # Sepsis/Pneumonia:  Treating as HCAP given recent hospitalization.  5/13 CXR with increasing consolidation in RML.  Lactic acidosis resolved after fluid resuscitation.  sTrop 0.03.  BNP 111.4.  No diuresis administered.  S/p vancomycin, Levaquin and 3L of NS bolus in ED on 5/12.  Today, BP soft 100/59, VS otherwise stable. Dyspneic with speech, which he reports is baseline on room air, O2 saturation 99%.  WBC up this am to 25.6.  BCx with strep pna x2.   -Per pharmacy would continue vancomycin, meropenem (5/12> ). Ok to [employer] Levaquin (5/12-5/13). He is allergic to penicillin.  Will need to figure out what oral med he can transition to once sensitivities come back. -Will consider Bolus 500cc for soft BPs pending repeat BP.  RN to repeat this. -IV NS at 125 mL/h for now.  Will attempt to wean off MIVF once taking good PO. -Follow up blood cultures.  -Urine Legionella and strep Ag not collected yet.  Will ask RN to assist with this. -HIV in progress -COPD management as below -Awaiting repeat trop -Procalcitonin ordered -Oxygen as needed -Needs follow-up chest x-ray in 4-6 weeks as an outpatient to exclude malignancy  # Abdominal distention: per patient acute over the last 3 days.  He reports decreased appetite.  No nausea, vomiting.  Recently had diarrheal illness.  BNP equivocal this admission.  Mildly TTP to RUQ and  epigastric areas.  Having normal BMs. - Abdominal u/s ordered to r/o ascites - GI cocktail and Protonix 40mg  ordered  # COPD: No signs of exacerbation. On Dulera at home.  -Continue home [facility] -DuoNeb every 4 hours when necessary.  Having wheeze on exam.  Ordered Duoneb now. -Spiriva -Oxygen as needed  # AKI: Improving. Serum creatinine 1.89> 1.39. Baseline 0.9. Likely due to dehydration from emesis and diarrhea in the setting of acute illness. -Status post 3 L of normal saline bolus -Continue IV fluid as above -Repeat BMP in a.m.  # Hypovolemic Hyponatremia: Improving. Na 129> 134. Likely due to GI loss from diarrhea. His story of pneumonia, diarrhea and hyponatremia fits into Legionella.  Status post 3 L of normal saline bolus in ED -Continue IV fluid as above -Urine Legionella antigen as above -BMP in the morning  # Hypokalemia: Resolved.  K 3.4>3.5. Likely due to GI loss.  s/p KCl x3 on 5/12 -Check magnesium -BMP in the morning  # Diabetes: Last A1c 6.8 on 3/23. Recently started on metformin.  BGs stable.  No hypoglycemic episodes overnight. -Hold her metformin -SSI-renal  # Hypertension: Slightly hypotensive on arrival.  -Hold home lisinopril and hydrochlorothiazide  # Tobacco use disorder: Smokes a pack a day. Recently started on Chantix -Nicotine patch  FEN/GI:  IV fluid as above, Carb modified diet, PPI   Disposition: Discharge home once acute illness improving.  Anticipate in next couple of days.  Subjective:  Patient reports that he is having some right sided CP that is  present constantly but worse with position changes.  He denies associated nausea, vomiting, diaphoresis.  He has intermittent SOB but he reports that this is baseline for him.  Objective: Temp:  [97.9 F (36.6 C)-100 F (37.8 C)] 98.1 F (36.7 C) (05/13 0506) Pulse Rate:  [90-121] 90 (05/13 0506) Resp:  [20-33] 20 (05/13 0506) BP: (94-117)/(54-84) 100/59 (05/13 0506) SpO2:  [91  %-100 %] 99 % (05/13 0506) Weight:  [267 lb 9.6 oz (121.4 kg)-280 lb (127 kg)] 267 lb 9.6 oz (121.4 kg) (05/13 0211) Physical Exam: General: awake, alert, tired appearing male resting in bed, NAD Cardiovascular: RRR, no murmurs Chest: +TTP to right side of chest near sternum at rib level 5-6. Respiratory: decreased breath sounds on right mid lung fields, global expiratory wheeze Abdomen: distended, +mild TTP RUQ and epigastric areas.  No fluid wave appreciated. +BS Extremities: WWP, no edema  Laboratory:  Recent Labs Lab 07/21/16 1940 07/22/16 0311  WBC 20.9* 25.6*  HGB 12.6* 11.5*  HCT 36.7* 33.8*  PLT 185 162    Recent Labs Lab 07/21/16 1940 07/22/16 0311  NA 129* 134*  K 3.4* 3.5  CL 97* 104  CO2 21* 22  BUN 34* 26*  CREATININE 1.89* 1.39*  CALCIUM 8.6* 7.8*  PROT 6.4*  --   BILITOT 1.5*  --   ALKPHOS 45  --   ALT 17  --   AST 24  --   GLUCOSE 161* 142*    Cardiac Panel (last 3 results)  Recent Labs  07/21/16 1940  TROPONINI 0.03*   BNP (last 3 results)  Recent Labs  07/22/16 0311  BNP 111.4*    ProBNP (last 3 results) No results for input(s): PROBNP in the last 8760 hours.  Lactic Acid, Venous    Component Value Date/Time   LATICACIDVEN 1.1 07/22/2016 0534    Imaging/Diagnostic Tests: Dg Chest 2 View  Result Date: 07/21/2016 CLINICAL DATA:  Cough, fever EXAM: CHEST  2 VIEW COMPARISON:  06/28/2016 chest radiograph. FINDINGS: Stable cardiomediastinal silhouette with normal heart size. No pneumothorax. No pleural effusion. New masslike focus of consolidation in the peripheral right mid lung. Clear left lung. No pulmonary edema. Hyperinflated lungs. IMPRESSION: 1. Masslike focus of consolidation in the peripheral right mid lung is new since 06/28/2016. The rapid onset of this finding is most compatible with pneumonia. No pleural effusion. Recommend follow-up PA and lateral post treatment chest radiographs in 4-6 weeks. 2. Hyperinflated lungs,  suggesting COPD. Electronically Signed   By: Delbert PhenixJason A Poff M.D.   On: 07/21/2016 19:36   Dg Chest Port 1 View  Result Date: 07/22/2016 CLINICAL DATA:  Increasing shortness of breath and chest pain. EXAM: PORTABLE CHEST 1 VIEW COMPARISON:  07/21/2016 FINDINGS: Shallow inspiration. Heart size and pulmonary vascularity are normal for technique. Masslike opacity in the right mid lung is increasing since previous study, likely representing progressing pneumonia. Left lung is clear. No blunting of costophrenic angles. No pneumothorax. IMPRESSION: Increasing consolidation in the right mid lung since previous study suggesting progression of pneumonia. Followup PA and lateral chest X-ray is recommended in 3-4 weeks following trial of antibiotic therapy to ensure resolution and exclude underlying malignancy. Electronically Signed   By: Burman NievesWilliam  Stevens M.D.   On: 07/22/2016 02:42    Raliegh IpGottschalk, Ashly M, DO 07/22/2016, 8:31 AM PGY-3, [place] [facility] FPTS Intern pager: [phone], text pages welcome

## 2016-07-23 LAB — BASIC METABOLIC PANEL
Anion gap: 8 (ref 5–15)
BUN: 11 mg/dL (ref 6–20)
CHLORIDE: 104 mmol/L (ref 101–111)
CO2: 25 mmol/L (ref 22–32)
Calcium: 8.5 mg/dL — ABNORMAL LOW (ref 8.9–10.3)
Creatinine, Ser: 0.97 mg/dL (ref 0.61–1.24)
GFR calc Af Amer: >60 mL/min (ref >60)
GFR calc non Af Amer: >60 mL/min (ref >60)
Glucose, Bld: 95 mg/dL (ref 65–99)
POTASSIUM: 3.6 mmol/L (ref 3.5–5.1)
SODIUM: 137 mmol/L (ref 135–145)

## 2016-07-23 LAB — GLUCOSE, CAPILLARY
GLUCOSE-CAPILLARY: 91 mg/dL (ref 65–99)
GLUCOSE-CAPILLARY: 101 mg/dL — AB (ref 65–99)
Glucose-Capillary: 198 mg/dL — ABNORMAL HIGH (ref 65–99)
Glucose-Capillary: 126 mg/dL — ABNORMAL HIGH (ref 65–99)

## 2016-07-23 LAB — CBC
HEMATOCRIT: 34.5 % — AB (ref 39.0–52.0)
HEMOGLOBIN: 11.5 g/dL — AB (ref 13.0–17.0)
MCH: 28.3 pg (ref 26.0–34.0)
MCHC: 33.3 g/dL (ref 30.0–36.0)
MCV: 85 fL (ref 78.0–100.0)
Platelets: 194 10*3/uL (ref 150–400)
RBC: 4.06 MIL/uL — AB (ref 4.22–5.81)
RDW: 17 % — ABNORMAL HIGH (ref 11.5–15.5)
WBC: 23.2 10*3/uL — ABNORMAL HIGH (ref 4.0–10.5)

## 2016-07-23 LAB — LEGIONELLA PNEUMOPHILA SEROGP 1 UR AG: L. pneumophila Serogp 1 Ur Ag: NEGATIVE

## 2016-07-23 LAB — HEMOGLOBIN A1C
Hgb A1c MFr Bld: 7.1 % — ABNORMAL HIGH (ref 4.8–5.6)
MEAN PLASMA GLUCOSE: 157 mg/dL

## 2016-07-23 NOTE — Progress Notes (Signed)
Pt needs an order for cardiac monitoring. MD paged

## 2016-07-23 NOTE — Progress Notes (Signed)
C/o right chest pain similar to the type of pain he has been having since admission. Denies radiation of pain and SOB. PRN pain med given. Will continue to monitor.     07/23/16 1630  Vitals  BP (!) 142/82  Pulse Rate 81  Pain Assessment  Pain Assessment 0-10  Pain Score 8  Pain Type Acute pain  Pain Location Chest  Pain Orientation Right  Pain Descriptors / Indicators Sharp  Pain Frequency Intermittent  Pain Intervention(s) Medication (See eMAR);Emotional support

## 2016-07-23 NOTE — Progress Notes (Signed)
Family Medicine Teaching Service Daily Progress Note Intern Pager: [phone]  Patient name: Ernest White Medical record number: [id number] Date of birth: [birth year] Age: 61 y.o. Gender: male  Primary Care Provider: Wendee Beavers, DO Consultants: none Code Status: Full  Pt Overview and Major Events to Date:  5/12: Admitted   Assessment and Plan: Ernest White is a 61 y.o. male presenting with right sided chest pain, shortness of breath, nausea, emesis, diarrhea, abdominal pain, fever, chills and fatigue.  PMH is significant for COPD, diabetes and  tobacco use disorder.    # Strep pneumonia, likely HCAP:  Improving. Treating as HCAP given recent hospitalization.  5/13 CXR with increasing consolidation in RML. Has remained afebrile overnight with VSS today.  WBC 25.6>23.2 this AM.  BCx with strep pneumo x2.  Urine strep pneumo +. HIV negative.   -Continue Day 3 Meropenem (given his PCN allergy could not give Zosyn), and can transition to po antibiotics pending sensitivities.  Per pharmacy ok with Meropenem alone for coverage so will discontinue Vancomycin.  -Follow up blood cultures and sensitivities  -Procalcitonin elevated to 14.49.  Trend per protocol.  -IV NS at 75 mL/h for now.  Will attempt to wean off MIVF once taking good PO.   -Urine Legionella pending  -Needs follow-up chest x-ray in 4-6 weeks as an outpatient to exclude malignancy  Strep pneumo bacteremia -Continue IV Meropenem (Day 3) .   Discussed with ID Dr. Luciana Axe this AM, appreciate recommendations.   Strep pneumo would not require a TEE.  Can continue to treat the pneumonia as we have identified organism.  Can await sensitivities before transitioning vs transition to Clindamycin or Levaquin today (for 5 additional days of treatment).   -Will await sensitivities and plan to transition to orals  -Repeat blood cultures ordered today.   # Abdominal distention: Improving, per patient had been acute over the last few  days.  BNP equivocal this admission.  Mildly TTP to RUQ and epigastric areas.  He reports having normal BMs. - Abdominal u/s ordered 5/13 to r/o ascites - Normal with no evidence of ascites. - GI cocktail and Protonix 40mg    # COPD: Stable.  No signs of exacerbation. On Dulera at home.  Currently on RA and satting well.  -Continue home Dulera -DuoNeb every 4 hours when necessary -Spiriva -Oxygen as needed  # AKI: Resolved. Serum creatinine 1.89> 1.39>0.97 this AM. Baseline 0.9. Likely due to dehydration from emesis and diarrhea in the setting of acute illness. -Status post 3 L of normal saline bolus -Continue IV fluid as above -Repeat BMP in a.m.  # Hypovolemic Hyponatremia: Resolved.  Na 129> 134>137. Likely due to GI loss from diarrhea. His story of pneumonia, diarrhea and hyponatremia fits into Legionella.  Status post 3 L of normal saline bolus in ED -Continue IV fluid as above -Urine Legionella antigen as above -BMP in the morning  # Hypokalemia: Resolved.  K 3.4>3.5>3.6 this AM. Likely due to GI loss.  s/p KCl x3 on 5/12 -Magnesium 1.8 -BMP in the morning  # Diabetes: Last A1c 6.8 on 3/23. Recently started on metformin.  BGs stable overnight 99, 100  No hypoglycemic episodes overnight. -Hold metformin -SSI-renal  # Hypertension: 121/75 this AM.  Slightly hypotensive on admission.  -Hold home lisinopril and hydrochlorothiazide  # Tobacco use disorder: Smokes a pack a day. Recently started on Chantix -Nicotine patch  FEN/GI:  IVF as above, Carb modified diet, PPI   Disposition: Discharge home once  transitioned to oral antibiotics and acute illness improving.   Subjective:  Patient reports he is feeling better this morning.  No acute events overnight and breathing well.  Denies nausea, vomiting, diaphoresis.   Objective: Temp:  [98.3 F (36.8 C)-98.4 F (36.9 C)] 98.4 F (36.9 C) (05/14 0506) Pulse Rate:  [75-96] 84 (05/14 1104) Resp:  [18-20] 18 (05/14  0506) BP: (111-121)/(71-80) 120/80 (05/14 1104) SpO2:  [95 %-98 %] 95 % (05/14 0506) Physical Exam: General:61 yo M,  awake, sitting on side of bed  Cardiovascular: RRR, no MRG Respiratory: CTAB,  no increased work of breathing  Abdomen: soft, NT, mildly distended with no fluid wave, +bs  Extremities: warm, no edema or tenderness noted   Laboratory:  Recent Labs Lab 07/21/16 1940 07/22/16 0311 07/23/16 0526  WBC 20.9* 25.6* 23.2*  HGB 12.6* 11.5* 11.5*  HCT 36.7* 33.8* 34.5*  PLT 185 162 194    Recent Labs Lab 07/21/16 1940 07/22/16 0311 07/23/16 0526  NA 129* 134* 137  K 3.4* 3.5 3.6  CL 97* 104 104  CO2 21* 22 25  BUN 34* 26* 11  CREATININE 1.89* 1.39* 0.97  CALCIUM 8.6* 7.8* 8.5*  PROT 6.4*  --   --   BILITOT 1.5*  --   --   ALKPHOS 45  --   --   ALT 17  --   --   AST 24  --   --   GLUCOSE 161* 142* 95    Cardiac Panel (last 3 results)  Recent Labs  07/21/16 1940 07/22/16 0856 07/22/16 1649  TROPONINI 0.03* 0.03* <0.03   BNP (last 3 results)  Recent Labs  07/22/16 0311  BNP 111.4*    ProBNP (last 3 results) No results for input(s): PROBNP in the last 8760 hours.  Lactic Acid, Venous    Component Value Date/Time   LATICACIDVEN 1.1 07/22/2016 0534    Imaging/Diagnostic Tests: [place] Abdomen Complete  Result Date: 07/22/2016 CLINICAL DATA:  61 year old male with abdominal pain and distention for 3 days. EXAM: ABDOMEN ULTRASOUND COMPLETE COMPARISON:  None. FINDINGS: Gallbladder: The gallbladder is unremarkable. There is no evidence of cholelithiasis or acute cholecystitis. Common bile duct: Diameter: 5 mm. There is no evidence of intrahepatic or extrahepatic biliary dilatation. Liver: No focal lesion identified. Within normal limits in parenchymal echogenicity. IVC: No abnormality visualized. Pancreas: Visualized portion unremarkable. Spleen: Size and appearance within normal limits. Right Kidney: Length: 12.3 cm. Echogenicity within normal limits.  No mass or hydronephrosis visualized. Left Kidney: Length: 12.7 cm. Echogenicity within normal limits. No mass or hydronephrosis visualized. Abdominal aorta: No aneurysm visualized. Other findings: None.  There is no evidence of ascites. IMPRESSION: Normal abdominal ultrasound.  No evidence of ascites. Electronically Signed   By: Harmon PierJeffrey  Hu M.D.   On: 07/22/2016 15:38    Freddrick MarchAmin, Dakayla Disanti, MD 07/23/2016, 11:21 AM PGY-1, [place] [facility] FPTS Intern pager: [phone], text pages welcome

## 2016-07-24 DIAGNOSIS — R0602 Shortness of breath: Secondary | ICD-10-CM

## 2016-07-24 LAB — CBC
HEMATOCRIT: 32.9 % — AB (ref 39.0–52.0)
HEMOGLOBIN: 10.8 g/dL — AB (ref 13.0–17.0)
MCH: 27.7 pg (ref 26.0–34.0)
MCHC: 32.8 g/dL (ref 30.0–36.0)
MCV: 84.4 fL (ref 78.0–100.0)
Platelets: 204 10*3/uL (ref 150–400)
RBC: 3.9 MIL/uL — ABNORMAL LOW (ref 4.22–5.81)
RDW: 16.5 % — AB (ref 11.5–15.5)
WBC: 10.9 10*3/uL — ABNORMAL HIGH (ref 4.0–10.5)

## 2016-07-24 LAB — CULTURE, BLOOD (ROUTINE X 2)
SPECIAL REQUESTS: ADEQUATE
Special Requests: ADEQUATE

## 2016-07-24 LAB — PROCALCITONIN: PROCALCITONIN: 5.18 ng/mL

## 2016-07-24 LAB — BASIC METABOLIC PANEL
ANION GAP: 6 (ref 5–15)
BUN: 10 mg/dL (ref 6–20)
CO2: 23 mmol/L (ref 22–32)
Calcium: 8.6 mg/dL — ABNORMAL LOW (ref 8.9–10.3)
Chloride: 108 mmol/L (ref 101–111)
Creatinine, Ser: 0.95 mg/dL (ref 0.61–1.24)
GFR calc Af Amer: >60 mL/min (ref >60)
GFR calc non Af Amer: >60 mL/min (ref >60)
Glucose, Bld: 126 mg/dL — ABNORMAL HIGH (ref 65–99)
POTASSIUM: 3.6 mmol/L (ref 3.5–5.1)
Sodium: 137 mmol/L (ref 135–145)

## 2016-07-24 LAB — GLUCOSE, CAPILLARY
Glucose-Capillary: 141 mg/dL — ABNORMAL HIGH (ref 65–99)
Glucose-Capillary: 127 mg/dL — ABNORMAL HIGH (ref 65–99)
Glucose-Capillary: 112 mg/dL — ABNORMAL HIGH (ref 65–99)
Glucose-Capillary: 99 mg/dL (ref 65–99)
Glucose-Capillary: 127 mg/dL — ABNORMAL HIGH (ref 65–99)

## 2016-07-24 MED ORDER — LEVOFLOXACIN 750 MG PO TABS
750.0000 mg | ORAL_TABLET | ORAL | Status: DC
  Administered 2016-07-24 – 2016-07-25 (×2): 750 mg via ORAL
  Filled 2016-07-24 (×2): qty 1

## 2016-07-24 MED ORDER — HYDROCHLOROTHIAZIDE 25 MG PO TABS
25.0000 mg | ORAL_TABLET | Freq: Every day | ORAL | Status: DC
  Administered 2016-07-24: 25 mg via ORAL
  Filled 2016-07-24: qty 1

## 2016-07-24 MED ORDER — AMLODIPINE BESYLATE 10 MG PO TABS
10.0000 mg | ORAL_TABLET | Freq: Every day | ORAL | Status: DC
  Administered 2016-07-24 – 2016-07-25 (×2): 10 mg via ORAL
  Filled 2016-07-24 (×2): qty 1

## 2016-07-24 NOTE — Progress Notes (Signed)
Family Medicine Teaching Service Daily Progress Note Intern Pager: [phone]  Patient name: Ernest White Medical record number: [id number] Date of birth: 02-01-1956 Age: 61 y.o. Gender: male  Primary Care Provider: Wendee BeaversMcMullen, David J, DO Consultants: None Code Status: Full  Pt Overview and Major Events to Date:  5/12: Admitted  5/12>5/15 Meropenem 5/15> Levaquin  Assessment and Plan: Ernest GlassDwain L Yowell is a 61 y.o. male presenting with right sided chest pain, shortness of breath, nausea, emesis, diarrhea, abdominal pain, fever, chills and fatigue.  PMH is significant for COPD, diabetes and  tobacco use disorder.    # Strep pneumonia, likely HCAP:  Improving. Treating as HCAP given recent hospitalization.  5/13 CXR with increasing consolidation in RML.  Has remained afebrile overnight with VSS today.  WBC significantly improved 25.6>23.2>10.9 this AM.  BCx with strep pneumo x2.  Urine strep pneumo +. Urine legionella negative.  HIV negative.   --Day 4 Meropenem >> transition to po Levaquin today as sensitivities have returned -IVF decreased to 10 cc/h since tolerating po  -Procalcitonin trending down to 14.49 > 5.18.  Trend per protocol.   -Will monitor on po abx x 1 day and likely d/c home tomorrow if continues to do well  -Needs follow-up chest x-ray in 4-6 weeks as an outpatient to exclude malignancy.    Strep pneumo bacteremia -Continue IV Meropenem (Day 4). Sensitive to Levaquin.  Will transition to po today and d/c Meropenem.  -Follow up repeat blood cultures  # Abdominal distention: Resolved.   # COPD: Stable.  No signs of exacerbation. On Dulera at home.  Currently on RA and satting well.  -Continue home Dulera  -DuoNeb every 4 hours when necessary  -Spiriva -Oxygen as needed  # AKI: Resolved. Serum creatinine 1.89>1.39>0.97>0.95 this AM. Baseline 0.9.  Likely due to dehydration from emesis and diarrhea in the setting of acute illness.  -Repeat BMP in a.m.  #  Hypovolemic Hyponatremia: Resolved.  Na 129> 134>137. Likely due to GI loss from diarrhea. His story of pneumonia, diarrhea and hyponatremia fits into Legionella.  Status post 3 L of normal saline bolus in ED -legionella negative  -BMP in the morning  # Hypokalemia:  Resolved.  K 3.4>3.5>3.6 this AM. Likely due to GI loss.  s/p KCl 10mEq x3 on 5/12 -Magnesium 1.8  -BMP in the morning   # Diabetes: Last A1c 6.8 on 3/23.  Recently started on Metformin.  BGs stable overnight 99, 100  No hypoglycemic episodes overnight.   -Hold Metformin  -sSSI-renal  #Hypertension:  Elevated blood pressures this morning 152/93.  Slightly hypotensive on admission so initially holding lisinopril and hydrochlorothiazide.   -will start amlodipine 10 mg daily  -Per PCP, will discontinue HCTZ on d/c given possibility of exacerbating gout   #Tobacco use disorder: Smokes a pack a day. Recently started on Chantix.  -Nicotine patch   FEN/GI:  IVF KVO, Carb modified diet, PPI   Disposition: Monitor on po antibiotics and likely discharge home tomorrow.   Subjective:  No acute events overnight.  Patient sleeping this morning.  Denies nausea, vomiting.  Is feeling better.   Objective: Temp:  [97.5 F (36.4 C)-98.6 F (37 C)] 97.5 F (36.4 C) (05/15 0549) Pulse Rate:  [80-83] 80 (05/15 0549) Resp:  [18] 18 (05/15 0549) BP: (142-152)/(82-93) 152/93 (05/15 0549) SpO2:  [97 %] 97 % (05/15 0549) Physical Exam: General:61 yo M,  Asleep in bed but awakes easily, NAD  Cardiovascular: RRR, no MRG Respiratory: CTAB,  no increased  work of breathing  Abdomen: soft, NT, +bs  Extremities: warm, no edema or tenderness noted   Laboratory:  Recent Labs Lab 07/22/16 0311 07/23/16 0526 07/24/16 0535  WBC 25.6* 23.2* 10.9*  HGB 11.5* 11.5* 10.8*  HCT 33.8* 34.5* 32.9*  PLT 162 194 204    Recent Labs Lab 07/21/16 1940 07/22/16 0311 07/23/16 0526 07/24/16 0535  NA 129* 134* 137 137  K 3.4* 3.5 3.6 3.6   CL 97* 104 104 108  CO2 21* 22 25 23   BUN 34* 26* 11 10  CREATININE 1.89* 1.39* 0.97 0.95  CALCIUM 8.6* 7.8* 8.5* 8.6*  PROT 6.4*  --   --   --   BILITOT 1.5*  --   --   --   ALKPHOS 45  --   --   --   ALT 17  --   --   --   AST 24  --   --   --   GLUCOSE 161* 142* 95 126*   Cardiac Panel (last 3 results)  Recent Labs  07/21/16 1940 07/22/16 0856 07/22/16 1649  TROPONINI 0.03* 0.03* <0.03   BNP (last 3 results)  Recent Labs  07/22/16 0311  BNP 111.4*   ProBNP (last 3 results) No results for input(s): PROBNP in the last 8760 hours.  Lactic Acid, Venous    Component Value Date/Time   LATICACIDVEN 1.1 07/22/2016 0534   Imaging/Diagnostic Tests: No results found.   Freddrick March, MD 07/24/2016, 1:28 PM PGY-1, [facility] FPTS Intern pager: [phone], text pages welcome

## 2016-07-25 LAB — BASIC METABOLIC PANEL
ANION GAP: 8 (ref 5–15)
BUN: 10 mg/dL (ref 6–20)
CALCIUM: 9.1 mg/dL (ref 8.9–10.3)
CO2: 26 mmol/L (ref 22–32)
Chloride: 104 mmol/L (ref 101–111)
Creatinine, Ser: 0.89 mg/dL (ref 0.61–1.24)
GFR calc Af Amer: >60 mL/min (ref >60)
GLUCOSE: 127 mg/dL — AB (ref 65–99)
Potassium: 3.4 mmol/L — ABNORMAL LOW (ref 3.5–5.1)
Sodium: 138 mmol/L (ref 135–145)

## 2016-07-25 LAB — CBC
HCT: 34.9 % — ABNORMAL LOW (ref 39.0–52.0)
Hemoglobin: 11.8 g/dL — ABNORMAL LOW (ref 13.0–17.0)
MCH: 28.6 pg (ref 26.0–34.0)
MCHC: 33.8 g/dL (ref 30.0–36.0)
MCV: 84.5 fL (ref 78.0–100.0)
PLATELETS: 242 10*3/uL (ref 150–400)
RBC: 4.13 MIL/uL — ABNORMAL LOW (ref 4.22–5.81)
RDW: 16.5 % — AB (ref 11.5–15.5)
WBC: 8.6 10*3/uL (ref 4.0–10.5)

## 2016-07-25 LAB — GLUCOSE, CAPILLARY
Glucose-Capillary: 113 mg/dL — ABNORMAL HIGH (ref 65–99)
Glucose-Capillary: 153 mg/dL — ABNORMAL HIGH (ref 65–99)

## 2016-07-25 MED ORDER — TIOTROPIUM BROMIDE MONOHYDRATE 18 MCG IN CAPS: 18.0000 ug | ORAL_CAPSULE | Freq: Every day | RESPIRATORY_TRACT | 12 refills | Status: DC

## 2016-07-25 MED ORDER — AMLODIPINE BESYLATE 10 MG PO TABS: 10.0000 mg | ORAL_TABLET | Freq: Every day | ORAL | 2 refills | Status: DC

## 2016-07-25 MED ORDER — POTASSIUM CHLORIDE CRYS ER 20 MEQ PO TBCR
40.0000 meq | EXTENDED_RELEASE_TABLET | Freq: Two times a day (BID) | ORAL | Status: DC
  Administered 2016-07-25: 40 meq via ORAL
  Filled 2016-07-25: qty 2

## 2016-07-25 MED ORDER — LISINOPRIL 20 MG PO TABS: 20.0000 mg | ORAL_TABLET | Freq: Every day | ORAL | 1 refills | Status: DC

## 2016-07-25 MED ORDER — LEVOFLOXACIN 750 MG PO TABS: 750.0000 mg | ORAL_TABLET | ORAL | 0 refills | Status: DC

## 2016-07-25 MED ORDER — LISINOPRIL 10 MG PO TABS
20.0000 mg | ORAL_TABLET | Freq: Every day | ORAL | Status: DC
  Administered 2016-07-25: 20 mg via ORAL
  Filled 2016-07-25: qty 2

## 2016-07-25 MED ORDER — NICOTINE 21 MG/24HR TD PT24: 21.0000 mg | MEDICATED_PATCH | Freq: Every day | TRANSDERMAL | 0 refills | Status: DC

## 2016-07-25 NOTE — Progress Notes (Signed)
Family Medicine Teaching Service Daily Progress Note Intern Pager: [phone]  Patient name: Ernest White Medical record number: [id number] Date of birth: October 23, 1955 Age: 61 y.o. Gender: male  Primary Care Provider: Wendee BeaversMcMullen, David J, DO Consultants: None Code Status: Full  Pt Overview and Major Events to Date:  5/12: Admitted  5/12>5/15 Meropenem 5/15> Levaquin  Assessment and Plan: Ernest White is a 61 y.o. male presenting with right sided chest pain, shortness of breath, nausea, emesis, diarrhea, abdominal pain, fever, chills and fatigue.  PMH is significant for COPD, diabetes and  tobacco use disorder.    # Strep pneumonia, likely HCAP:  Improving. Remained afebrile overnight with VSS.  5/13 CXR with increasing consolidation in RML.  BCx with strep pneumo x2.  Urine strep pneumo +. Urine legionella negative.  HIV negative.   -Transitioned from IV Meropenem to po Levaquin 5/15 per sensitivities -Day 2 po Levaquin.  Will discharge with Levaquin for total course of 10 days on antibiotics -Procalcitonin trended down to 5.18  -Needs follow-up chest x-ray in 4-6 weeks as an outpatient to exclude malignancy.    #Strep pneumo bacteremia -Will continue po Levaquin as outpatient to complete total course of 10 days on antibiotics  -repeat blood cultures NGx1D  Leukocytosis.  Resolved.  On admission 25.6.  8.6 this AM.    # Abdominal distention: Resolved.   # COPD: Stable.  No signs of exacerbation. On Dulera at home.  Currently on RA and satting well.  -Continue home Dulera  -DuoNeb every 4 hours when necessary  -Spiriva  # AKI: Resolved. Serum creatinine on admission 1.89>0.89 this AM. BL of 0.9.  Likely due to dehydration from emesis and diarrhea in the setting of acute illness.  -Daily BMET   # Hypovolemic Hyponatremia: Resolved.  Na 129> 134>138 this AM. Likely due to GI loss from diarrhea.  -BMP in the morning  # Hypokalemia:  Resolved.  K 3.4>3.5>3.4 this AM.  Likely due to GI loss.  -Replete with 40 dkur x2 -Magnesium 1.8  -daily bmet   # Diabetes: Last A1c 6.8 on 3/23.  Recently started on Metformin.  BGs stable overnight 112, 127, 113. -Hold Metformin  -sSSI-renal  #Hypertension:  Elevated blood pressures this morning 149/93.   -Continue amlodipine 10 mg daily  -Will discharge on half his home dose of Lisinopril.  Follow up with PCP to reassess.  -Per PCP, will discontinue HCTZ on d/c given possibility of exacerbating gout   #Tobacco use disorder: Smokes a pack a day. Recently started on Chantix.  -Nicotine patch   FEN/GI:  IVF KVO, Carb modified diet, PPI   Disposition: Plan for discharge home today.   Subjective:  No acute events overnight.  Patient remained afebrile on po antibiotics and feels well this morning.  Feels ready to go home.  Objective: Temp:  [97.6 F (36.4 C)-97.7 F (36.5 C)] 97.7 F (36.5 C) (05/16 0549) Pulse Rate:  [75-82] 79 (05/16 0549) Resp:  [18] 18 (05/16 0549) BP: (135-149)/(72-93) 149/93 (05/16 0549) SpO2:  [95 %-98 %] 95 % (05/16 14780808) Physical Exam: General:61 yo M, sitting on bed, NAD  Cardiovascular: RRR, no MRG Respiratory: CTAB,  no wheezes, rales or rhonchi noted  Abdomen: soft, NT, +bs  Extremities: warm, no edema or tenderness present  Psych: mood normal   Laboratory:  Recent Labs Lab 07/23/16 0526 07/24/16 0535 07/25/16 0408  WBC 23.2* 10.9* 8.6  HGB 11.5* 10.8* 11.8*  HCT 34.5* 32.9* 34.9*  PLT 194 204 242  Recent Labs Lab 07/21/16 1940  07/23/16 0526 07/24/16 0535 07/25/16 0408  NA 129*  < > 137 137 138  K 3.4*  < > 3.6 3.6 3.4*  CL 97*  < > 104 108 104  CO2 21*  < > 25 23 26   BUN 34*  < > 11 10 10   CREATININE 1.89*  < > 0.97 0.95 0.89  CALCIUM 8.6*  < > 8.5* 8.6* 9.1  PROT 6.4*  --   --   --   --   BILITOT 1.5*  --   --   --   --   ALKPHOS 45  --   --   --   --   ALT 17  --   --   --   --   AST 24  --   --   --   --   GLUCOSE 161*  < > 95 126* 127*  <  > = values in this interval not displayed. Cardiac Panel (last 3 results)  Recent Labs  07/22/16 1649  TROPONINI <0.03   BNP (last 3 results)  Recent Labs  07/22/16 0311  BNP 111.4*   ProBNP (last 3 results) No results for input(s): PROBNP in the last 8760 hours.  Lactic Acid, Venous    Component Value Date/Time   LATICACIDVEN 1.1 07/22/2016 0534   Imaging/Diagnostic Tests: No results found.   Freddrick March, MD 07/25/2016, 12:45 PM PGY-1, [facility] Family Medicine FPTS Intern pager: [phone], text pages welcome

## 2016-07-26 ENCOUNTER — Ambulatory Visit: Payer: Medicaid Other | Admitting: Family Medicine

## 2016-07-26 NOTE — Progress Notes (Signed)
Transitions of Care Pharmacy Note: Late Entry  Plan:  Educated on new Levaquin prescription, new amlodipine, discontinued HCTZ  Outpatient follow-up: BP  --------------------------------------------- Ladean Rayawain L Jon BillingsMorrison is an 61 y.o. male who presents with a chief complaint of chest pain/SOB. In anticipation of discharge, pharmacy has reviewed this patient's prior to admission medication history, as well as current inpatient medications [facility].  Current medication indications, dosing, frequency, and notable side effects reviewed with patient. patient verbalized understanding of current inpatient medication regimen and is aware that the After Visit Summary when presented, will represent the most accurate medication list at discharge.   Joh Donah DriverL Colella did not express any concerns regarding his medications. We educated on the importance of completing his antibiotic prescription as well as separating it from calcium containing products. He is also aware of his change in BP medications, and is aware that he should get his BP checked if he has new lightheaded/dizziness.  He reported no further questions or concerns at the conclusion of our visit.   Assessment: Understanding of regimen: good Understanding of indications: good Potential of compliance: good Barriers to Obtaining Medications: No  Patient instructed to contact inpatient pharmacy team with further questions or concerns if needed.    Time spent preparing for discharge counseling: 10 min  Time spent counseling patient: 15 min    York CeriseKatherine Cook, PharmD Pharmacy Resident  Pager [phone] 07/26/16 10:10 AM

## 2016-07-28 LAB — CULTURE, BLOOD (ROUTINE X 2)
CULTURE: NO GROWTH
CULTURE: NO GROWTH
SPECIAL REQUESTS: ADEQUATE
Special Requests: ADEQUATE

## 2016-07-30 NOTE — Discharge Summary (Signed)
[facility] Discharge Summary  Patient name: Ernest White Medical record number: [id number] Date of birth: [birth year] Age: 61 y.o. Gender: male Date of Admission: 07/21/2016  Date of Discharge: 07/25/2016  Admitting Physician: [name], MD  Primary Care Provider: [name], DO Consultants: None   Indication for Hospitalization:  R sided chest pain, shortness of breath  Discharge Diagnoses/Problem List:  Strep pneumonia Strep pneumo bacteremia Leukocytosis COPD AKI Hypovolemic hyponatremia Hypokalemia T2DM Tobacco use  Disposition: Home  Discharge Condition: Stable, improved   Discharge Exam:  General:61 yo M, sitting on bed, NAD  Cardiovascular: RRR, no MRG Respiratory: CTAB,  no wheezes, rales or rhonchi noted  Abdomen: soft, NT, +bs  Extremities: warm, no edema or tenderness present  Psych: mood normal   Brief Hospital Course:  61 yo M presenting with right sided chest pain, shortness of breath, nausea, emesis, diarrhea.  PMH is significant for COPD, diabetes and tobacco use disorder.  On admission, patient with signs and symptoms of pneumonia with crackles on lung exam.  CXR with mass-like focus of consolidation in the peripheral right mid lung. Concern for HCAP given his recent hospitalization about 2.5 months ago for pneumonia. On admission meeting sepsis criteria based on his initial vital signs with qSOFA of 2. He also had lactic acidosis to 2.46.  No increased work of breathing or wheeze to suspect COPD exacerbation and no signs of fluid overload to suspect CHF.  ACS unlikely with nonanginal chest pain but EKG with T-wave inversions.  Blood cultures ordered and he was started on Vancomycin/Levaquin in the ED.  In addition was given 3L of NS bolus with subsequent improvement in blood pressure. Procalcitonin was obtained and appeared to be downtrending as was his leukocytosis.  Meropenem was added until blood cultures returned as  he is allergic to penicillins.  ACS was ruled out with negative troponins.  Cultures returned positive for strep pneumonia, sensitive to Levaquin.  He was transitioned to po Levaquin and monitored overnight for fevers and signs of worsening.  He remained afebrile overnight and felt back at his baseline.  He was discharged on po Levaquin for bacteremia for a total course of 10 days on antibiotics.     Issues for Follow Up:  1. Have discontinued HCTZ on discharge due to possibility of gout exacerbation.  Started on Amlodipine 10 mg daily.  Follow up blood pressures and adjust medication as necessary.  2. Current everyday smoker.  Advised to follow Dr. Birder Robson recommendations for smoking cessation.  Patient on Nicotine patch while hospitalized and sent home with additional patches per patient request. 3.   Will need follow-up chest x-ray in 4-6 weeks as an outpatient to ensure resolution and exclude malignancy.  4.   Discharged with levofloxacin.  Please ensure he has completed this course of antibiotics.   5.   BMET for Cr and electrolytes. Patient with AKI 1.89 with BL of 0.9.   Significant Procedures: None   Significant Labs and Imaging:   Recent Labs Lab 07/24/16 0535 07/25/16 0408  WBC 10.9* 8.6  HGB 10.8* 11.8*  HCT 32.9* 34.9*  PLT 204 242    Recent Labs Lab 07/24/16 0535 07/25/16 0408  NA 137 138  K 3.6 3.4*  CL 108 104  CO2 23 26  GLUCOSE 126* 127*  BUN 10 10  CREATININE 0.95 0.89  CALCIUM 8.6* 9.1   Results/Tests Pending at Time of Discharge: None   Discharge Medications:  Allergies as of  07/25/2016      Reactions   Penicillins Anaphylaxis   Has patient had a PCN reaction causing immediate rash, facial/tongue/throat swelling, SOB or lightheadedness with hypotension: Yes Has patient had a PCN reaction causing severe rash involving mucus membranes or skin necrosis: No Has patient had a PCN reaction that required hospitalization pt was hospitalized at time of  reaction Has patient had a PCN reaction occurring within the last 10 years: No If all of the above answers are "NO", then may proceed with Cephalosporin use.   Aspirin Other (See Comments)   Stomach pain      Medication List    STOP taking these medications   hydrochlorothiazide 25 MG tablet Commonly known as:  HYDRODIURIL   varenicline 0.5 MG X 11 & 1 MG X 42 tablet Commonly known as:  CHANTIX STARTING MONTH PAK   varenicline 1 MG tablet Commonly known as:  CHANTIX CONTINUING MONTH PAK     TAKE these medications   ACCU-CHEK AVIVA PLUS w/Device Kit Use as directed to check blood glucose up to twice daily. E11.9.  May substitute for formulary preferred.   albuterol 108 (90 Base) MCG/ACT inhaler Commonly known as:  PROVENTIL HFA;VENTOLIN HFA Inhale 2 puffs into the lungs every 6 (six) hours as needed for wheezing or shortness of breath. Notes to patient:  No recent doses, please take as ordered.   amLODipine 10 MG tablet Commonly known as:  NORVASC Take 1 tablet (10 mg total) by mouth daily.   BAYER MICROLET LANCETS lancets Use as instructed   glucose blood test strip Commonly known as:  ACCU-CHEK AVIVA PLUS Use as directed to check blood glucose up to twice daily. E11.9.  May substitute for formulary preferred.   ibuprofen 200 MG tablet Commonly known as:  ADVIL,MOTRIN Take 600 mg by mouth every 6 (six) hours as needed (pain).   levofloxacin 750 MG tablet Commonly known as:  LEVAQUIN Take 1 tablet (750 mg total) by mouth daily.   lisinopril 20 MG tablet Commonly known as:  PRINIVIL,ZESTRIL Take 1 tablet (20 mg total) by mouth daily. What changed:  medication strength  how much to take   metFORMIN 500 MG tablet Commonly known as:  GLUCOPHAGE Take 1 tablet (500 mg total) by mouth 2 (two) times daily with a meal. Notes to patient:  No recent doses, please take as ordered.   mometasone-formoterol 200-5 MCG/ACT Aero Commonly known as:  DULERA Inhale 2 puffs  into the lungs 2 (two) times daily.   nicotine 21 mg/24hr patch Commonly known as:  NICODERM CQ - dosed in mg/24 hours Place 1 patch (21 mg total) onto the skin daily.   tiotropium 18 MCG inhalation capsule Commonly known as:  SPIRIVA Place 1 capsule (18 mcg total) into inhaler and inhale daily.      Discharge Instructions: Please refer to Patient Instructions section of EMR for full details.  Patient was counseled important signs and symptoms that should prompt return to medical care, changes in medications, dietary instructions, activity restrictions, and follow up appointments.   Follow-Up Appointments: Follow-up Information    Virginia Crews, MD. Go on 07/31/2016.   Specialty:  Family Medicine Why:  Appointment for hospital follow up 2:45 PM.  Please arrive by 2:30 PM.  Contact information: [place] [address] [phone]          Lovenia Kim, MD 07/30/2016, 3:23 PM PGY-1, [place]

## 2016-07-31 ENCOUNTER — Encounter: Payer: Self-pay | Admitting: Licensed Clinical Social Worker

## 2016-07-31 ENCOUNTER — Ambulatory Visit (INDEPENDENT_AMBULATORY_CARE_PROVIDER_SITE_OTHER): Payer: Medicaid Other | Admitting: Family Medicine

## 2016-07-31 VITALS — BP 108/88 | HR 97 | Temp 98.1°F | Ht 71.0 in | Wt 261.0 lb

## 2016-07-31 DIAGNOSIS — I10 Essential (primary) hypertension: Secondary | ICD-10-CM | POA: Diagnosis not present

## 2016-07-31 DIAGNOSIS — F17200 Nicotine dependence, unspecified, uncomplicated: Secondary | ICD-10-CM | POA: Diagnosis not present

## 2016-07-31 DIAGNOSIS — J13 Pneumonia due to Streptococcus pneumoniae: Secondary | ICD-10-CM | POA: Diagnosis present

## 2016-07-31 DIAGNOSIS — N179 Acute kidney failure, unspecified: Secondary | ICD-10-CM | POA: Diagnosis not present

## 2016-07-31 MED ORDER — METFORMIN HCL 500 MG PO TABS: 500.0000 mg | ORAL_TABLET | Freq: Two times a day (BID) | ORAL | 0 refills | Status: DC

## 2016-07-31 MED ORDER — LEVOFLOXACIN 750 MG PO TABS: 750.0000 mg | ORAL_TABLET | ORAL | 0 refills | Status: DC

## 2016-07-31 MED ORDER — AMLODIPINE BESYLATE 10 MG PO TABS: 10.0000 mg | ORAL_TABLET | Freq: Every day | ORAL | 0 refills | Status: DC

## 2016-07-31 MED FILL — metFORMIN HCL 500 MG TABS: 500 | 30 days supply | Qty: 60 | Fill #0

## 2016-07-31 MED FILL — levoFLOXacin 750 MG TABS: 750 | 7 days supply | Qty: 7 | Fill #0

## 2016-07-31 MED FILL — AMLODIPINE BESYLATE 10 MG T: 10 | 30 days supply | Qty: 30 | Fill #0

## 2016-07-31 NOTE — Assessment & Plan Note (Signed)
Patient is asymptomatic Recheck BMP today

## 2016-07-31 NOTE — Assessment & Plan Note (Signed)
Advised on quitting Could consider pharmacy clinic for further tobacco cessation Patient will be able to afford nicotine patches in 3-4 days

## 2016-07-31 NOTE — Patient Instructions (Signed)
Nice to meet you today. We are getting some labs today and someone will call you or send you a letter with the results when they're available.  It is important to take your medications as prescribed. Stop taking HCTZ. Start taking amlodipine daily. Complete a 7 day course of Levaquin, and antibiotic, to treat your pneumonia and blushing infection. Is important to take the entire course exactly as directed.  You [facility] as we discussed. We are providing a 1 time co-pay assistance. In the future if you are unable to afford her medications, please talk to someone at the pharmacy or at our clinic to discuss.  Follow-up in 1-2 weeks with your PCP to ensure that your infection is better.  Take care,  Dr. BLeonard Schwartz

## 2016-07-31 NOTE — Progress Notes (Signed)
Subjective:   Ernest White is a 61 y.o. male with a history of COPD, hypokalemia, T2 DM, tobacco use here for hospital follow-up for strep pneumonia and bacteremia, AKI, hypovolemic hyponatremia  Patient was admitted to the hospital 07/21/16 through 07/25/16 after presenting with right-sided chest pain and shortness of breath and found to have masslike focus of consolidation in peripheral right midlung.  He was also meeting sepsis criteria with acute sofa score of 2, lactic acid of 2.46. He was treated for HCAP with vancomycin and Levaquin in the emergency department after obtaining blood cultures. After 3 L of normal saline bolus, his blood pressure improved. ACS was ruled out with negative troponins. As his blood cultures grew strep pneumo, he is transitioned to by mouth Levaquin for a 10 day course.  He was also treated for hypovolemic hyponatremia and prerenal AKI (creatinine 1.89, baseline 0.9) with IV fluids during hospitalization. His HCTZ was switched to amlodipine 10 mg daily for hypertension management due to his history of gout.  He was also discharged with nicotine patches for smoking cessation and received smoking cessation counseling during hospitalization.  Since hospitalization, patient reports "doing ok".  He has not picked of any medicines from the pharmacy because he could not afford it. He did not talk to anyone while he was in the hospital to not being able to afford his medications. He is still taking HCTZ and has not started amlodipine. He did not take any of his antibiotics. He states that in 3 days he will get his disability check and be able to afford his co-pay for his medications. He denies fever, but does report cold sweats and chills. He continues to have a cough and pleuritic chest pain, but denies shortness of breath or problems with urination. He is planning to cut down on thin amount of nicotine that he uses in his e-cigarette soon.  Review of Systems:  Per HPI.    Social History: current smoker - vapes  Objective:  BP 108/88   Pulse 97   Temp 98.1 F (36.7 C) (Oral)   Ht 5\' 11"  (1.803 White)   Wt 261 lb (118.4 kg)   SpO2 96%   BMI 36.40 kg/White   Gen:  61 y.o. male in NAD HEENT: NCAT, MMM, anicteric sclerae CV: RRR, no MRG Resp: Non-labored, Crackles present in right base and right middle lobe Abd: Soft, NTND, BS present, no guarding or organomegaly Ext: WWP, no edema MSK: No obvious deformities, gait intact Neuro: Alert and oriented, speech normal       Chemistry      Component Value Date/Time   NA 138 07/25/2016 0408   NA 140 06/14/2016 1632   K 3.4 (L) 07/25/2016 0408   CL 104 07/25/2016 0408   CO2 26 07/25/2016 0408   BUN 10 07/25/2016 0408   BUN 14 06/14/2016 1632   CREATININE 0.89 07/25/2016 0408   CREATININE 1.14 12/15/2015 1113      Component Value Date/Time   CALCIUM 9.1 07/25/2016 0408   ALKPHOS 45 07/21/2016 1940   AST 24 07/21/2016 1940   ALT 17 07/21/2016 1940   BILITOT 1.5 (H) 07/21/2016 1940      Lab Results  Component Value Date   WBC 8.6 07/25/2016   HGB 11.8 (L) 07/25/2016   HCT 34.9 (L) 07/25/2016   MCV 84.5 07/25/2016   PLT 242 07/25/2016   No results found for: TSH Lab Results  Component Value Date   HGBA1C 7.1 (H) 07/22/2016  Assessment & Plan:     Ernest White is a 61 y.o. male here for   Primary hypertension Well-controlled Advised patient to stop taking HCTZ and pickup amlodipine BMP today Indigent fund to provide one time co-pay assistance for amlodipine  Pneumonia Patient was recently hospitalized with sepsis and bacteremia secondary to healthcare acquired strep pneumo pneumonia He did not take any antibiotics after going home He is afebrile today with stable vital signs, but he endorses frequent cold sweats and chills Obtain repeat blood culture Treat with 7 day course of Levaquin starting today Indigent fund to pay one time co-pay for antibiotic Follow-up with PCP in 1-2  weeks Needs repeat chest x-ray in about one month to ensure resolution of consolidation  and exclude malignancy  AKI (acute kidney injury) (HCC) Patient is asymptomatic Recheck BMP today  Tobacco use disorder Advised on quitting Could consider pharmacy clinic for further tobacco cessation Patient will be able to afford nicotine patches in 3-4 days   Ernest DownerBacigalupo, Morene Cecilio M, MD MPH PGY-3,  [facility] 07/31/2016  4:10 PM

## 2016-07-31 NOTE — Assessment & Plan Note (Signed)
Patient was recently hospitalized with sepsis and bacteremia secondary to healthcare acquired strep pneumo pneumonia He did not take any antibiotics after going home He is afebrile today with stable vital signs, but he endorses frequent cold sweats and chills Obtain repeat blood culture Treat with 7 day course of Levaquin starting today Indigent fund to pay one time co-pay for antibiotic Follow-up with PCP in 1-2 weeks Needs repeat chest x-ray in about one month to ensure resolution of consolidation  and exclude malignancy

## 2016-07-31 NOTE — Progress Notes (Signed)
[facility] Vou[facility]: Previous assistance?  no Taxi, Bus pass, gas card, food, or other? no Med co-pay? $9.00         [facility] Indigent Fund?  no Other:  NA  Provider:  Dr. Beryle FlockBacigalupo [facility] Staff:  Soundra PilonMoore, Sherial Ebrahim H, LCSW  Sammuel Hineseborah Clayvon Parlett, LCSW Licensed Clinical Social Worker [facility]   [phone] 4:33 PM

## 2016-07-31 NOTE — Assessment & Plan Note (Signed)
Well-controlled Advised patient to stop taking HCTZ and pickup amlodipine BMP today Indigent fund to provide one time co-pay assistance for amlodipine

## 2016-08-01 LAB — BASIC METABOLIC PANEL
BUN/Creatinine Ratio: 17 (ref 10–24)
BUN: 18 mg/dL (ref 8–27)
CO2: 27 mmol/L (ref 18–29)
Calcium: 9.8 mg/dL (ref 8.6–10.2)
Chloride: 101 mmol/L (ref 96–106)
Creatinine, Ser: 1.08 mg/dL (ref 0.76–1.27)
GFR, EST AFRICAN AMERICAN: 85 mL/min/{1.73_m2} (ref >59)
GFR, EST NON AFRICAN AMERICAN: 73 mL/min/{1.73_m2} (ref >59)
Glucose: 103 mg/dL — ABNORMAL HIGH (ref 65–99)
POTASSIUM: 4.5 mmol/L (ref 3.5–5.2)
SODIUM: 139 mmol/L (ref 134–144)

## 2016-08-02 NOTE — Congregational Nurse Program (Signed)
Congregational Nurse Program Note  Date of Encounter: 07/10/2016  Past Medical History: Past Medical History:  Diagnosis Date  . Arthritis   . COPD (chronic obstructive pulmonary disease) (HCC)   . Diabetes mellitus without complication (HCC)   . Gout   . Hypertension     Encounter Details:     CNP Questionnaire - 07/10/16 0945      Patient Demographics   Is this a new or existing patient? Existing   Patient is considered a/an Not Applicable   Race African-American/Black     Patient Assistance   Location of Patient Assistance [place]   Patient's financial/insurance status Medicaid;Low Income   Uninsured Patient ([employer], trade unionCard/Care Connects) No   Patient referred to apply for the following financial assistance Not Applicable   Food insecurities addressed Not Applicable   Transportation assistance No   Type of Assistance Other   Assistance securing medications No   Educational health offerings Hypertension     Encounter Details   Primary purpose of visit Education/Health Concerns;Chronic Illness/Condition Visit   Was an Emergency Department visit averted? Not Applicable   Does patient have a medical provider? Yes   Patient referred to Follow up with established PCP   Was a mental health screening completed? (GAINS tool) No   Does patient have dental issues? No   Does patient have vision issues? No   Does your patient have an abnormal blood pressure today? Yes   Since previous encounter, have you referred patient for abnormal blood pressure that resulted in a new diagnosis or medication change? No   Does your patient have an abnormal blood glucose today? No   Since previous encounter, have you referred patient for abnormal blood glucose that resulted in a new diagnosis or medication change? No   Was there a life-saving intervention made? No         Clinical Intake - 07/31/16 1458      Pre-visit preparation   Pre-visit preparation completed Yes     Abuse/Neglect   Do  you feel unsafe in your current relationship? No   Do you feel physically threatened by others? No   Anyone hurting you at home, work, or school? No     Patient Literacy   How often do you need to have someone help you when you read instructions, pamphlets, or other written materials from your doctor or pharmacy? 1 - Never     Web designerLanguage Assistant   Interpreter Needed? No     BP check. Client has taken his medication this morning but he says he feels stressed. Denies headache or floaters. He will have his BP re-checked on Friday.

## 2016-08-06 LAB — CULTURE, BLOOD (SINGLE)

## 2016-08-07 ENCOUNTER — Encounter: Payer: Self-pay | Admitting: Family Medicine

## 2016-08-16 ENCOUNTER — Ambulatory Visit (INDEPENDENT_AMBULATORY_CARE_PROVIDER_SITE_OTHER): Payer: Medicaid Other | Admitting: Family Medicine

## 2016-08-16 ENCOUNTER — Encounter: Payer: Self-pay | Admitting: Family Medicine

## 2016-08-16 VITALS — BP 126/78 | HR 103 | Temp 98.6°F | Ht 71.0 in | Wt 255.6 lb

## 2016-08-16 DIAGNOSIS — Z1159 Encounter for screening for other viral diseases: Secondary | ICD-10-CM | POA: Diagnosis present

## 2016-08-16 DIAGNOSIS — N529 Male erectile dysfunction, unspecified: Secondary | ICD-10-CM | POA: Diagnosis not present

## 2016-08-16 DIAGNOSIS — F17200 Nicotine dependence, unspecified, uncomplicated: Secondary | ICD-10-CM

## 2016-08-16 DIAGNOSIS — Z0000 Encounter for general adult medical examination without abnormal findings: Secondary | ICD-10-CM

## 2016-08-16 DIAGNOSIS — Z23 Encounter for immunization: Secondary | ICD-10-CM

## 2016-08-16 MED ORDER — SILDENAFIL CITRATE 20 MG PO TABS: 20.0000 mg | ORAL_TABLET | Freq: Three times a day (TID) | ORAL | 3 refills | Status: DC

## 2016-08-16 NOTE — Patient Instructions (Signed)
Thank you for coming in to see Ernest White today. Please see below to review our plan for today's visit.  1. I have given you a prescription for your erectile dysfunction called Sildenafil. I have prescribed you the 20 mg tablets which she will take 5 tablets when needed. Be aware he will have to pay out of pocket for these medications however this is the cheapest options we have available. 2. Start Chantix today. See below for information on how to take this medication. He will stop smoking 1 week after starting this medication. 3. We will recheck your former health care provider to obtain records. In the meantime, please call one of the GI specialist to schedule a screening colonoscopy.  4. You are now up-to-date on your Tdap vaccine. I will notify you the results of your blood test. 5. Return to the clinic in 2 weeks.  Please call the clinic at [phone] if your symptoms worsen or you have any concerns. It was my pleasure to see you. -- Durward Parcel, DO [facility], PGY-1  Varenicline oral tablets What is this medicine? VARENICLINE (var EN i kleen) is used to help people quit smoking. It can reduce the symptoms caused by stopping smoking. It is used with a patient support program recommended by your physician. This medicine may be used for other purposes; ask your health care provider or pharmacist if you have questions. COMMON BRAND NAME(S): Chantix What should I tell my health care provider before I take this medicine? They need to know if you have any of these conditions: -bipolar disorder, depression, schizophrenia or other mental illness -heart disease -if you often drink alcohol -kidney disease -peripheral vascular disease -seizures -stroke -suicidal thoughts, plans, or attempt; a previous suicide attempt by you or a family member -an unusual or allergic reaction to varenicline, other medicines, foods, dyes, or preservatives -pregnant or trying to get  pregnant -breast-feeding How should I use this medicine? Take this medicine by mouth after eating. Take with a full glass of water. Follow the directions on the prescription label. Take your doses at regular intervals. Do not take your medicine more often than directed. There are 3 ways you can use this medicine to help you quit smoking; talk to your health care professional to decide which plan is right for you: 1) you can choose a quit date and start this medicine 1 week before the quit date, or, 2) you can start taking this medicine before you choose a quit date, and then pick a quit date between day 8 and 35 days of treatment, or, 3) if you are not sure that you are able or willing to quit smoking right away, start taking this medicine and slowly decrease the amount you smoke as directed by your health care professional with the goal of being cigarette-free by week 12 of treatment. Stick to your plan; ask about support groups or other ways to help you remain cigarette-free. If you are motivated to quit smoking and did not succeed during a previous attempt with this medicine for reasons other than side effects, or if you returned to smoking after this treatment, speak with your health care professional about whether another course of this medicine may be right for you. A special MedGuide will be given to you by the pharmacist with each prescription and refill. Be sure to read this information carefully each time. Talk to your pediatrician regarding the use of this medicine in children. This medicine is not approved  for use in children. Overdosage: If you think you have taken too much of this medicine contact a poison control center or emergency room at once. NOTE: This medicine is only for you. Do not share this medicine with others. What if I miss a dose? If you miss a dose, take it as soon as you can. If it is almost time for your next dose, take only that dose. Do not take double or extra  doses. What may interact with this medicine? -alcohol or any product that contains alcohol -insulin -other stop smoking aids -theophylline -warfarin This list may not describe all possible interactions. Give your health care provider a list of all the medicines, herbs, non-prescription drugs, or dietary supplements you use. Also tell them if you smoke, drink alcohol, or use illegal drugs. Some items may interact with your medicine. What should I watch for while using this medicine? Visit your doctor or health care professional for regular check ups. Ask for ongoing advice and encouragement from your doctor or healthcare professional, friends, and family to help you quit. If you smoke while on this medication, quit again Your mouth may get dry. Chewing sugarless gum or sucking hard candy, and drinking plenty of water may help. Contact your doctor if the problem does not go away or is severe. You may get drowsy or dizzy. Do not drive, use machinery, or do anything that needs mental alertness until you know how this medicine affects you. Do not stand or sit up quickly, especially if you are an older patient. This reduces the risk of dizzy or fainting spells. Sleepwalking can happen during treatment with this medicine, and can sometimes lead to behavior that is harmful to you, other people, or property. Stop taking this medicine and tell your doctor if you start sleepwalking or have other unusual sleep-related activity. Decrease the amount of alcoholic beverages that you drink during treatment with this medicine until you know if this medicine affects your ability to tolerate alcohol. Some people have experienced increased drunkenness (intoxication), unusual or sometimes aggressive behavior, or no memory of things that have happened (amnesia) during treatment with this medicine. The use of this medicine may increase the chance of suicidal thoughts or actions. Pay special attention to how you are responding  while on this medicine. Any worsening of mood, or thoughts of suicide or dying should be reported to your health care professional right away. What side effects may I notice from receiving this medicine? Side effects that you should report to your doctor or health care professional as soon as possible: -allergic reactions like skin rash, itching or hives, swelling of the face, lips, tongue, or throat -acting aggressive, being angry or violent, or acting on dangerous impulses -breathing problems -changes in vision -chest pain or chest tightness -confusion, trouble speaking or understanding -new or worsening depression, anxiety, or panic attacks -extreme increase in activity and talking (mania) -fast, irregular heartbeat -feeling faint or lightheaded, falls -fever -pain in legs when walking -problems with balance, talking, walking -redness, blistering, peeling or loosening of the skin, including inside the mouth -ringing in ears -seeing or hearing things that aren't there (hallucinations) -seizures -sleepwalking -sudden numbness or weakness of the face, arm or leg -thoughts about suicide or dying, or attempts to commit suicide -trouble passing urine or change in the amount of urine -unusual bleeding or bruising -unusually weak or tired Side effects that usually do not require medical attention (report to your doctor or health care professional if they continue  or are bothersome): -constipation -headache -nausea, vomiting -strange dreams -stomach gas -trouble sleeping This list may not describe all possible side effects. Call your doctor for medical advice about side effects. You may report side effects to FDA at 1-800-FDA-1088. Where should I keep my medicine? Keep out of the reach of children. Store at room temperature between 15 and 30 degrees C (59 and 86 degrees F). Throw away any unused medicine after the expiration date.  NOTE: This sheet is a summary. It may not cover all  possible information. If you have questions about this medicine, talk to your doctor, pharmacist, or health care provider.  2018 Elsevier/Gold Standard (2014-11-11 16:14:23)     Sildenafil tablets (Viagra) What is this medicine? SILDENAFIL (sil DEN a fil) is used to treat erection problems in men. This medicine may be used for other purposes; ask your health care provider or pharmacist if you have questions. COMMON BRAND NAME(S): Viagra What should I tell my health care provider before I take this medicine? They need to know if you have any of these conditions: -bleeding disorders -eye or vision problems, including a rare inherited eye disease called retinitis pigmentosa -anatomical deformation of the penis, Peyronie's disease, or history of priapism (painful and prolonged erection) -heart disease, angina, a history of heart attack, irregular heart beats, or other heart problems -high or low blood pressure -history of blood diseases, like sickle cell anemia or leukemia -history of stomach bleeding -kidney disease -liver disease -stroke -an unusual or allergic reaction to sildenafil, other medicines, foods, dyes, or preservatives -pregnant or trying to get pregnant -breast-feeding How should I use this medicine? Take this medicine by mouth with a glass of water. Follow the directions on the prescription label. The dose is usually taken 1 hour before sexual activity. You should not take the dose more than once per day. Do not take your medicine more often than directed. Talk to your pediatrician regarding the use of this medicine in children. This medicine is not used in children for this condition. Overdosage: If you think you have taken too much of this medicine contact a poison control center or emergency room at once. NOTE: This medicine is only for you. Do not share this medicine with others. What if I miss a dose? This does not apply. Do not take double or extra doses. What may  interact with this medicine? Do not take this medicine with any of the following medications: -cisapride -nitrates like amyl nitrite, isosorbide dinitrate, isosorbide mononitrate, nitroglycerin -riociguat This medicine may also interact with the following medications: -antiviral medicines for HIV or AIDS -bosentan -certain medicines for benign prostatic hyperplasia (BPH) -certain medicines for blood pressure -certain medicines for fungal infections like ketoconazole and itraconazole -cimetidine -erythromycin -rifampin This list may not describe all possible interactions. Give your health care provider a list of all the medicines, herbs, non-prescription drugs, or dietary supplements you use. Also tell them if you smoke, drink alcohol, or use illegal drugs. Some items may interact with your medicine. What should I watch for while using this medicine? If you notice any changes in your vision while taking this drug, call your doctor or health care professional as soon as possible. Stop using this medicine and call your health care provider right away if you have a loss of sight in one or both eyes. Contact your doctor or health care professional right away if you have an erection that lasts longer than 4 hours or if it becomes painful. This may  be a sign of a serious problem and must be treated right away to prevent permanent damage. If you experience symptoms of nausea, dizziness, chest pain or arm pain upon initiation of sexual activity after taking this medicine, you should refrain from further activity and call your doctor or health care professional as soon as possible. Do not drink alcohol to excess (examples, 5 glasses of wine or 5 shots of whiskey) when taking this medicine. When taken in excess, alcohol can increase your chances of getting a headache or getting dizzy, increasing your heart rate or lowering your blood pressure. Using this medicine does not protect you or your partner against  HIV infection (the virus that causes AIDS) or other sexually transmitted diseases. What side effects may I notice from receiving this medicine? Side effects that you should report to your doctor or health care professional as soon as possible: -allergic reactions like skin rash, itching or hives, swelling of the face, lips, or tongue -breathing problems -changes in hearing -changes in vision -chest pain -fast, irregular heartbeat -prolonged or painful erection -seizures Side effects that usually do not require medical attention (report to your doctor or health care professional if they continue or are bothersome): -back pain -dizziness -flushing -headache -indigestion -muscle aches -nausea -stuffy or runny nose This list may not describe all possible side effects. Call your doctor for medical advice about side effects. You may report side effects to FDA at 1-800-FDA-1088. Where should I keep my medicine? Keep out of reach of children. Store at room temperature between 15 and 30 degrees C (59 and 86 degrees F). Throw away any unused medicine after the expiration date. NOTE: This sheet is a summary. It may not cover all possible information. If you have questions about this medicine, talk to your doctor, pharmacist, or health care provider.  2018 Elsevier/Gold Standard (2015-02-09 12:00:25)

## 2016-08-16 NOTE — Progress Notes (Signed)
   Subjective:   Patient ID: Ernest White    DOB: 03-13-1955, 61 y.o. male   MRN: [id number]  CC: "Erectile dysfunction"  HPI: Ernest White is a 61 y.o. male who [facility] for erectile dysfunction. Problems discussed today are as follows:  Erectile dysfunction: Onset 6 months ago. Patient states he has not experienced a nocturnal erection. Currently has a girlfriend and is concerned. He is not on nitrates for chest pain. ROS: Denies increased stress, anxiety or depression, libido.  Tobacco use disorder: Current every day smoker with 1 pack per day. Was seen by Dr. Raymondo BandKoval and prescribed Chantix but has not taken because he was concerned that he would get depressed in kill himself because he heard this happened another individual. Interested in stopping. ROS: Denies chest pain, shortness of breath, cough, hemoptysis, wheeze.  Preventative healthcare: Patient states he has not received a colonoscopy before. Also states he has not been screened for HCV and is concerned because his brother had it.  Complete ROS performed, see HPI for pertinent.  PMFSH: HTN, COPD, NIDDM, recently resolved strep pneumo bacteremia and pneumonia. Smoking status reviewed. Medications reviewed.  Objective:   BP 126/78   Pulse (!) 103   Temp 98.6 F (37 C) (Oral)   Ht 5\' 11"  (1.803 m)   Wt 255 lb 9.6 oz (115.9 kg)   SpO2 94%   BMI 35.65 kg/m  Vitals and nursing note reviewed.  General: well nourished, well developed, in no acute distress with non-toxic appearance Neck: supple, non-tender without lymphadenopathy CV: regular rate and rhythm without murmurs, rubs, or gallops, no lower extremity edema Lungs: clear to auscultation bilaterally with normal work of breathing Skin: warm, dry, no rashes or lesions, cap refill < 2 seconds Extremities: warm and well perfused, normal tone  Assessment & Plan:   Erectile dysfunction Chronic. Ongoing issue for 6 months. Has well-controlled  diabetes. Hypertension controlled on medications. Currently every day smoker. No signs of psychogenic ED. --Continue current HTN medications --Smoking cessation discussed --Given sildenafil 20 mg tablets with recommendation to take 5 times daily as needed, given handout  Tobacco use disorder Chronic. Currently one pack per day. Ready to quit. Has tried with nicotine with some success. Previously by Dr. Raymondo BandKoval and given prescription for Chantix. --Patient to initiate Chantix today and stop smoking in 1 week, handout given --RTC in 2 weeks  Preventative health care No history of colonoscopy. Overdue for Tdap booster. No history of HCV screen. --Given handout to call and schedule screening colonoscopy --Release of information obtained for prior PCP --Tdap booster given --HCV screen obtained  Orders Placed This Encounter  Procedures  . Tdap vaccine greater than or equal to 7yo IM  . Hepatitis C antibody   Meds ordered this encounter  Medications  . sildenafil (REVATIO) 20 MG tablet    Sig: Take 1 tablet (20 mg total) by mouth 3 (three) times daily.    Dispense:  100 tablet    Refill:  3    Durward Parcelavid McMullen, DO [facility], PGY-1 08/17/2016 12:17 PM

## 2016-08-16 NOTE — Assessment & Plan Note (Addendum)
Chronic. Ongoing issue for 6 months. Has well-controlled diabetes. Hypertension controlled on medications. Currently every day smoker. No signs of psychogenic ED. --Continue current HTN medications --Smoking cessation discussed --Given sildenafil 20 mg tablets with recommendation to take 5 times daily as needed, given handout

## 2016-08-16 NOTE — Assessment & Plan Note (Addendum)
No history of colonoscopy. Overdue for Tdap booster. No history of HCV screen. --Given handout to call and schedule screening colonoscopy --Release of information obtained for prior PCP --Tdap booster given --HCV screen obtained

## 2016-08-16 NOTE — Assessment & Plan Note (Addendum)
Chronic. Currently one pack per day. Ready to quit. Has tried with nicotine with some success. Previously by Dr. Raymondo BandKoval and given prescription for Chantix. --Patient to initiate Chantix today and stop smoking in 1 week, handout given --RTC in 2 weeks

## 2016-08-17 ENCOUNTER — Emergency Department (HOSPITAL_COMMUNITY)
Admission: EM | Admit: 2016-08-17 | Discharge: 2016-08-17 | Disposition: A | Discharge status: Home / Self Care | Payer: Medicaid Other | Attending: Emergency Medicine | Admitting: Emergency Medicine

## 2016-08-17 ENCOUNTER — Encounter (HOSPITAL_COMMUNITY): Payer: Self-pay

## 2016-08-17 ENCOUNTER — Encounter: Payer: Self-pay | Admitting: Family Medicine

## 2016-08-17 ENCOUNTER — Encounter: Payer: Self-pay | Admitting: Pediatric Intensive Care

## 2016-08-17 ENCOUNTER — Emergency Department (HOSPITAL_COMMUNITY): Payer: Medicaid Other

## 2016-08-17 DIAGNOSIS — J449 Chronic obstructive pulmonary disease, unspecified: Secondary | ICD-10-CM | POA: Insufficient documentation

## 2016-08-17 DIAGNOSIS — E119 Type 2 diabetes mellitus without complications: Secondary | ICD-10-CM | POA: Insufficient documentation

## 2016-08-17 DIAGNOSIS — F17210 Nicotine dependence, cigarettes, uncomplicated: Secondary | ICD-10-CM | POA: Insufficient documentation

## 2016-08-17 DIAGNOSIS — Z7984 Long term (current) use of oral hypoglycemic drugs: Secondary | ICD-10-CM | POA: Insufficient documentation

## 2016-08-17 DIAGNOSIS — R1011 Right upper quadrant pain: Secondary | ICD-10-CM | POA: Diagnosis present

## 2016-08-17 DIAGNOSIS — Z79899 Other long term (current) drug therapy: Secondary | ICD-10-CM | POA: Insufficient documentation

## 2016-08-17 DIAGNOSIS — I10 Essential (primary) hypertension: Secondary | ICD-10-CM | POA: Insufficient documentation

## 2016-08-17 LAB — LIPASE, BLOOD: LIPASE: 42 U/L (ref 11–51)

## 2016-08-17 LAB — COMPREHENSIVE METABOLIC PANEL
ALBUMIN: 3.9 g/dL (ref 3.5–5.0)
ALT: 17 U/L (ref 17–63)
AST: 20 U/L (ref 15–41)
Alkaline Phosphatase: 54 U/L (ref 38–126)
Anion gap: 8 (ref 5–15)
BILIRUBIN TOTAL: 1 mg/dL (ref 0.3–1.2)
BUN: 14 mg/dL (ref 6–20)
CHLORIDE: 105 mmol/L (ref 101–111)
CO2: 26 mmol/L (ref 22–32)
CREATININE: 1.15 mg/dL (ref 0.61–1.24)
Calcium: 9.2 mg/dL (ref 8.9–10.3)
GFR calc Af Amer: >60 mL/min (ref >60)
GLUCOSE: 96 mg/dL (ref 65–99)
POTASSIUM: 3.7 mmol/L (ref 3.5–5.1)
Sodium: 139 mmol/L (ref 135–145)
TOTAL PROTEIN: 7 g/dL (ref 6.5–8.1)

## 2016-08-17 LAB — CBC WITH DIFFERENTIAL/PLATELET
BASOS ABS: 0 10*3/uL (ref 0.0–0.1)
Basophils Relative: 0 %
EOS PCT: 1 %
Eosinophils Absolute: 0.1 10*3/uL (ref 0.0–0.7)
HEMATOCRIT: 38 % — AB (ref 39.0–52.0)
Hemoglobin: 12.4 g/dL — ABNORMAL LOW (ref 13.0–17.0)
Lymphocytes Relative: 24 %
Lymphs Abs: 2.9 10*3/uL (ref 0.7–4.0)
MCH: 28.4 pg (ref 26.0–34.0)
MCHC: 32.6 g/dL (ref 30.0–36.0)
MCV: 87 fL (ref 78.0–100.0)
MONO ABS: 1.3 10*3/uL — AB (ref 0.1–1.0)
MONOS PCT: 11 %
NEUTROS ABS: 8 10*3/uL — AB (ref 1.7–7.7)
Neutrophils Relative %: 64 %
PLATELETS: 237 10*3/uL (ref 150–400)
RBC: 4.37 MIL/uL (ref 4.22–5.81)
RDW: 16.1 % — AB (ref 11.5–15.5)
WBC: 12.4 10*3/uL — ABNORMAL HIGH (ref 4.0–10.5)

## 2016-08-17 LAB — CBG MONITORING, ED: GLUCOSE-CAPILLARY: 172 mg/dL — AB (ref 65–99)

## 2016-08-17 LAB — HEPATITIS C ANTIBODY

## 2016-08-17 MED ORDER — SODIUM CHLORIDE 0.9 % IV SOLN
INTRAVENOUS | Status: DC
  Administered 2016-08-17: 18:00:00 via INTRAVENOUS

## 2016-08-17 MED ORDER — ONDANSETRON HCL 4 MG/2ML IJ SOLN
4.0000 mg | Freq: Once | INTRAMUSCULAR | Status: AC
  Administered 2016-08-17: 4 mg via INTRAVENOUS
  Filled 2016-08-17: qty 2

## 2016-08-17 MED ORDER — HYDROMORPHONE HCL 1 MG/ML IJ SOLN
1.0000 mg | INTRAMUSCULAR | Status: DC | PRN
  Administered 2016-08-17 (×2): 1 mg via INTRAVENOUS
  Filled 2016-08-17 (×2): qty 1

## 2016-08-17 MED ORDER — OXYCODONE-ACETAMINOPHEN 5-325 MG PO TABS
2.0000 | ORAL_TABLET | Freq: Once | ORAL | Status: AC
  Administered 2016-08-17: 2 via ORAL
  Filled 2016-08-17: qty 2

## 2016-08-17 MED ORDER — ONDANSETRON 8 MG PO TBDP: 8.0000 mg | ORAL_TABLET | Freq: Three times a day (TID) | ORAL | 0 refills | Status: DC | PRN

## 2016-08-17 MED ORDER — OXYCODONE-ACETAMINOPHEN 5-325 MG PO TABS: 1.0000 | ORAL_TABLET | ORAL | 0 refills | Status: DC | PRN

## 2016-08-17 MED ORDER — SODIUM CHLORIDE 0.9 % IV BOLUS (SEPSIS)
1000.0000 mL | Freq: Once | INTRAVENOUS | Status: AC
  Administered 2016-08-17: 1000 mL via INTRAVENOUS

## 2016-08-17 NOTE — ED Provider Notes (Signed)
[facility] DEPT Provider Note   CSN: [id number] Arrival date & time: 08/17/16  1336     History   Chief Complaint Chief Complaint  Patient presents with  . Shortness of Breath    HPI Zenon L Bures is a 61 y.o. male.  HPI Patient presents to the emergency room for evaluation of right upper quadrant abdominal pain. 79 was walking earlier today with a crate of fluid over his head. One of his home though he started developing sharp pain in his right upper abdomen and lower rib area. Causes him to feel short of breath. It hurts to palpate in the right upper quadrant. He denies any fevers. No coughing. No history of gallbladder disease. No history of pancreatitis. No dysuria. Past Medical History:  Diagnosis Date  . Arthritis   . COPD (chronic obstructive pulmonary disease) ([place])   . Diabetes mellitus without complication ([place])   . Gout   . Hypertension     Patient Active Problem List   Diagnosis Date Noted  . Erectile dysfunction 08/16/2016  . Preventative health care 08/16/2016  . AKI (acute kidney injury) ([place]) 07/22/2016  . Pneumonia 07/21/2016  . Tobacco use disorder 06/14/2016  . External hemorrhoids 06/03/2016  . Syncope and collapse 12/27/2015  . Diabetes ([place]) 12/27/2015  . Chronic obstructive airway disease ([place]) 12/23/2015  . Primary hypertension 12/15/2015    Past Surgical History:  Procedure Laterality Date  . APPENDECTOMY    . HERNIA REPAIR         Home Medications    Prior to Admission medications   Medication Sig Start Date End Date Taking? Authorizing Provider  albuterol (PROVENTIL HFA;VENTOLIN HFA) 108 (90 Base) MCG/ACT inhaler Inhale 2 puffs into the lungs every 6 (six) hours as needed for wheezing or shortness of breath. 06/01/16  Yes Cardwell Bing, DO  amLODipine (NORVASC) 10 MG tablet Take 1 tablet (10 mg total) by mouth daily. 07/31/16  Yes Bacigalupo, Dionne Bucy, MD  BAYER MICROLET LANCETS lancets Use as instructed 06/13/16  Yes  [place] Bing, DO  Blood Glucose Monitoring Suppl (ACCU-CHEK AVIVA PLUS) w/Device KIT Use as directed to check blood glucose up to twice daily. E11.9.  May substitute for formulary preferred. 06/14/16  Yes Hensel, Jamal Collin, MD  fluticasone furoate-vilanterol (BREO ELLIPTA) 100-25 MCG/INH AEPB Inhale 1 puff into the lungs daily.   Yes [provider]  glucose blood (ACCU-CHEK AVIVA PLUS) test strip Use as directed to check blood glucose up to twice daily. E11.9.  May substitute for formulary preferred. 06/14/16  Yes Hensel, Jamal Collin, MD  ibuprofen (ADVIL,MOTRIN) 200 MG tablet Take 600 mg by mouth every 6 (six) hours as needed (pain).   Yes [provider]  levofloxacin (LEVAQUIN) 750 MG tablet Take 750 mg by mouth daily. Started on 07-31-16 for 7 days. ( pt completed on 08-07-16)   Yes [provider]  lisinopril (PRINIVIL,ZESTRIL) 20 MG tablet Take 1 tablet (20 mg total) by mouth daily. 07/26/16  Yes Lovenia Kim, MD  metFORMIN (GLUCOPHAGE) 500 MG tablet Take 1 tablet (500 mg total) by mouth 2 (two) times daily with a meal. 07/31/16  Yes Bacigalupo, Dionne Bucy, MD  tiotropium (SPIRIVA) 18 MCG inhalation capsule Place 1 capsule (18 mcg total) into inhaler and inhale daily. 07/26/16  Yes Lovenia Kim, MD  mometasone-formoterol [facility]) 200-5 MCG/ACT AERO Inhale 2 puffs into the lungs 2 (two) times daily. Patient not taking: Reported on 08/16/2016 12/27/15   Wyatt Bing, DO  nicotine (NICODERM  CQ - DOSED IN MG/24 HOURS) 21 mg/24hr patch Place 1 patch (21 mg total) onto the skin daily. Patient not taking: Reported on 08/17/2016 07/26/16   Lovenia Kim, MD  sildenafil (REVATIO) 20 MG tablet Take 1 tablet (20 mg total) by mouth 3 (three) times daily. 08/16/16   Kerman Bing, DO    Family History No family history on file.  Social History Social History  Substance Use Topics  . Smoking status: Current Every Day Smoker    Packs/day: 1.00    Years: 48.00    Types:  Cigarettes  . Smokeless tobacco: Never Used     Comment: Works at [employer]  . Alcohol use 0.6 oz/week    1 Cans of beer per week     Comment: "moderately"     Allergies   Penicillins and Aspirin   Review of Systems Review of Systems  All other systems reviewed and are negative.    Physical Exam Updated Vital Signs BP 130/76   Pulse 87   Resp (!) 23   Ht 1.803 m (_0 )   Wt 115.7 kg (255 lb)   SpO2 100%   BMI 35.57 kg/m   Physical Exam  Constitutional: No distress.  Obese  HENT:  Head: Normocephalic and atraumatic.  Right Ear: External ear normal.  Left Ear: External ear normal.  Eyes: Conjunctivae are normal. Right eye exhibits no discharge. Left eye exhibits no discharge. No scleral icterus.  Neck: Neck supple. No tracheal deviation present.  Cardiovascular: Normal rate, regular rhythm and intact distal pulses.   Pulmonary/Chest: Effort normal and breath sounds normal. No stridor. No respiratory distress. He has no wheezes. He has no rales.  Abdominal: Soft. Bowel sounds are normal. He exhibits no distension. There is tenderness in the right upper quadrant and epigastric area. There is guarding. There is no rebound.  Musculoskeletal: He exhibits no edema or tenderness.  Neurological: He is alert. He has normal strength. No cranial nerve deficit (no facial droop, extraocular movements intact, no slurred speech) or sensory deficit. He exhibits normal muscle tone. He displays no seizure activity. Coordination normal.  Skin: Skin is warm and dry. No rash noted.  Psychiatric: He has a normal mood and affect.  Nursing note and vitals reviewed.    ED Treatments / Results  Labs (all labs ordered are listed, but only abnormal results are displayed) Labs Reviewed  CBG MONITORING, ED - Abnormal; Notable for the following:       Result Value   Glucose-Capillary 172 (*)    All other components within normal limits  COMPREHENSIVE METABOLIC PANEL  LIPASE, BLOOD    CBC WITH DIFFERENTIAL/PLATELET  URINALYSIS, ROUTINE W REFLEX MICROSCOPIC    EKG  EKG Interpretation  Date/Time:  Friday August 17 2016 13:45:43 EDT Ventricular Rate:  86 PR Interval:    QRS Duration: 84 QT Interval:  348 QTC Calculation: 417 R Axis:   36 Text Interpretation:  Sinus rhythm Borderline T abnormalities, lateral leads No significant change since last tracing Confirmed by Dorie Rank [id number]) on 08/17/2016 1:50:07 PM       Radiology Dg Chest 2 View  Result Date: 08/17/2016 CLINICAL DATA:  Acute right chest pain and shortness of breath. EXAM: CHEST  2 VIEW COMPARISON:  07/22/2016 and prior radiographs FINDINGS: The cardiomediastinal silhouette is unremarkable. Mild bibasilar atelectasis/scarring again noted. Consolidation within the right lung has resolved. There is no evidence of focal airspace disease, pulmonary edema, suspicious pulmonary nodule/mass, pleural effusion, or pneumothorax. No  acute bony abnormalities are identified. IMPRESSION: No evidence of acute cardiopulmonary disease. Cardiomegaly and mild bibasilar atelectasis/scarring again noted. Resolution of right lung consolidation. Electronically Signed   By: Margarette Canada M.D.   On: 08/17/2016 15:32    Procedures Procedures (including critical care time)  Medications Ordered in ED Medications  sodium chloride 0.9 % bolus 1,000 mL (1,000 mLs Intravenous New Bag/Given 08/17/16 1612)    And  0.9 %  sodium chloride infusion (not administered)  ondansetron (ZOFRAN) injection 4 mg (not administered)  HYDROmorphone (DILAUDID) injection 1 mg (not administered)     Initial Impression / Assessment and Plan / ED Course  I have reviewed the triage vital signs and the nursing notes.  Pertinent labs & imaging results that were available during my care of the patient were reviewed by me and considered in my medical decision making (see chart for details).   patient presents to emergency room with complaints of right upper  quadrant abdominal pain. Initially described chest pain however on exam he is tender to palpation in the right upper quadrant. I'm suspicious for possible biliary colic.  Plan on laboratory tests, pain medications ultrasound. I will turn the case to Dr. Jeanell Sparrow.  Final Clinical Impressions(s) / ED Diagnoses   pending   Dorie Rank, MD 08/17/16 [phone]

## 2016-08-17 NOTE — ED Provider Notes (Addendum)
61 year old man with recent pneumonia, sepsis, type 2 diabetes, COPD signed out to me with right upper quadrant pain nausea and vomiting. Patient had ultrasound here and labs. Workup is significant for leukocytosis as well as sludge noted in gallbladder. Patient continues to have pain although he is not actively vomiting now. Patient given po pain meds and antiemetics.  He has toleerated fluid and wishes d/c/  Discussed need for follow up and return precautions.  I discussed results the patient and he voices understanding and agreement with plan. 6:21 PM Vitals:   08/17/16 1553 08/17/16 1730  BP: 130/76 108/68  Pulse: 87 79  Resp: (!) 23 (!) 21       Margarita Grizzleay, Ernest Cavitt, MD 08/17/16 [id number]    Margarita Grizzleay, Ernest Dibari, MD 08/17/16 [phone]

## 2016-08-17 NOTE — ED Notes (Signed)
Pt verbalized understanding discharge instructions and denies any further needs or questions at this time. VS stable, ambulatory and steady gait.   

## 2016-08-17 NOTE — ED Triage Notes (Signed)
Pt BIB [facility] from home where pt reports he walked about a mile carrying a crate of food over his head. He states that when he got home he had sharpe right sided rib pain and SOB. Pt states he is unable to sit or lay down. 94% RA, place on nonrebreather. Pt took ibuprofen at home X1 hr ago.

## 2016-08-17 NOTE — ED Notes (Signed)
Pt able to keep down fluids 

## 2016-08-29 ENCOUNTER — Telehealth: Payer: Self-pay | Admitting: Family Medicine

## 2016-08-29 NOTE — Telephone Encounter (Signed)
That is fine. Please inform patient he can call 1-800-quit-now for free nicotine patches. Otherwise he can go to his local pharmacy get them. He will need to take the 21 mg patches. Thanks.  -- Durward Parcelavid Kathelene Rumberger, DO [facility], PGY-1

## 2016-08-29 NOTE — Telephone Encounter (Signed)
Pt states the Chanix is making him dizzy and he has stopped taking it. Pt would like to try the patches instead. ep

## 2016-09-03 ENCOUNTER — Other Ambulatory Visit: Payer: Self-pay | Admitting: Family Medicine

## 2016-09-17 NOTE — Congregational Nurse Program (Signed)
Congregational Nurse Program Note  Date of Encounter: 08/17/2016  Past Medical History: Past Medical History:  Diagnosis Date  . Arthritis   . COPD (chronic obstructive pulmonary disease) ([facility])   . Diabetes mellitus without complication ([facility])   . Gout   . Hypertension     Encounter Details: BP check

## 2016-09-18 NOTE — Telephone Encounter (Signed)
Left message on patient voicemail with below information from MD.

## 2016-10-03 ENCOUNTER — Other Ambulatory Visit: Payer: Self-pay | Admitting: Family Medicine

## 2016-10-03 ENCOUNTER — Telehealth: Payer: Self-pay | Admitting: Family Medicine

## 2016-10-03 DIAGNOSIS — Z1211 Encounter for screening for malignant neoplasm of colon: Secondary | ICD-10-CM

## 2016-10-03 NOTE — Telephone Encounter (Signed)
Patient called today requesting to be referred to:  Specialist: GI Provider name/phone number: [facility] Diagnosis (code): routine colonscopy Date of last OV: 08/16/2016  Note routed to provider and Referral Coordinator [facility](Tia Hill).  Will call patient back regarding referral status.  Tia C Hill

## 2016-10-04 ENCOUNTER — Telehealth: Payer: Self-pay | Admitting: *Deleted

## 2016-10-04 MED FILL — SILDENAFIL 20 MG TABLET: 20 | 33 days supply | Qty: 100 | Fill #0

## 2016-10-04 NOTE — Telephone Encounter (Signed)
Pharmac[facility] called to verify patient's Rx for sildenafil 20 mg.  The directions written: Take 1 tablet 3 times daily is written for pulmonary hypertension dosage.  If medication is given for erectile dysfunction, should be written as take 1 tablet up to 5 per day as needed for erectile dysfunction.  Please change Rx or call [facility]. Patient was on his way to pharmacy to pick up medication. PCP pager was called. Please call ASAP.  Clovis PuMartin, Tamika L, RN

## 2016-10-05 ENCOUNTER — Other Ambulatory Visit: Payer: Self-pay | Admitting: Family Medicine

## 2016-10-05 DIAGNOSIS — N529 Male erectile dysfunction, unspecified: Secondary | ICD-10-CM

## 2016-10-05 MED ORDER — SILDENAFIL CITRATE 20 MG PO TABS: 20.0000 mg | ORAL_TABLET | Freq: Every day | ORAL | 3 refills | Status: DC | PRN

## 2016-10-05 NOTE — Telephone Encounter (Signed)
Rx changed to 5 times daily PRN to reflect use for erectile dysfunction. New prescription sent. Please advise.

## 2016-10-22 ENCOUNTER — Ambulatory Visit: Payer: Medicaid Other | Admitting: Family Medicine

## 2016-11-02 ENCOUNTER — Encounter: Payer: Self-pay | Admitting: Pediatric Intensive Care

## 2016-11-02 DIAGNOSIS — E119 Type 2 diabetes mellitus without complications: Secondary | ICD-10-CM

## 2016-11-02 LAB — GLUCOSE, POCT (MANUAL RESULT ENTRY): POC GLUCOSE: 156 mg/dL — AB (ref 70–99)

## 2016-11-05 ENCOUNTER — Ambulatory Visit: Payer: Medicaid Other | Admitting: Family Medicine

## 2016-11-05 NOTE — Progress Notes (Deleted)
   Subjective:   Patient ID: Ernest White    DOB: 10/25/1955, 61 y.o. male   MRN: [id number]  CC: "***"  HPI: Ernest White is a 61 y.o. male who presents to clinic today ***. Problems discussed today are as follows:  ***: *** ROS: ***  ***Last seen 6/7 initiating Chantix with instructions to RTC 2 weeks. STopped 2/2 dizziness. Given nicotine patch. Given sidinafil for ED. Instrcted to schedule colonoscopy, referral for [facility] placed. Needs eye exam. Needs A1c.  Complete ROS performed, see HPI for pertinent.  PMFSH: HTN, COPD, NIDDM with ED, tobacco use disorder. Surgical history hernia repair, appendectomy. Family history none. Smoking status reviewed. Medications reviewed.  Objective:   There were no vitals taken for this visit. Vitals and nursing note reviewed.  General: well nourished, well developed, in no acute distress with non-toxic appearance HEENT: normocephalic, atraumatic, moist mucous membranes Neck: supple, non-tender without lymphadenopathy CV: regular rate and rhythm without murmurs, rubs, or gallops, no lower extremity edema Lungs: clear to auscultation bilaterally with normal work of breathing Abdomen: soft, non-tender, non-distended, no masses or organomegaly palpable, normoactive bowel sounds Skin: warm, dry, no rashes or lesions, cap refill < 2 seconds Extremities: warm and well perfused, normal tone  Assessment & Plan:   No problem-specific Assessment & Plan notes found for this encounter.  No orders of the defined types were placed in this encounter.  No orders of the defined types were placed in this encounter.   Durward Parcel, DO [facility], PGY-2 11/05/2016 6:27 AM

## 2016-11-13 ENCOUNTER — Ambulatory Visit: Payer: Medicaid Other | Admitting: Family Medicine

## 2016-11-13 NOTE — Progress Notes (Deleted)
   Subjective:   Patient ID: Neomia Glasswain L Rollings    DOB: Feb 09, 1956, 61 y.o. male   MRN: [id number]  CC: "***"  HPI: Neomia GlassDwain L Maudlin is a 61 y.o. male who presents to clinic today ***. Problems discussed today are as follows:  ***: *** ROS: ***  ***Last seen 6/7 initiating Chantix with instructions to RTC 2 weeks. STopped 2/2 dizziness. Given nicotine patch. Given sidinafil for ED. Instrcted to schedule colonoscopy, referral for [facility] placed. Needs eye exam. Needs A1c.  Complete ROS performed, see HPI for pertinent.  PMFSH: HTN, COPD, NIDDM with ED, tobacco use disorder. Surgical history hernia repair, appendectomy. Family history none. Smoking status reviewed. Medications reviewed.  Objective:   There were no vitals taken for this visit. Vitals and nursing note reviewed.  General: well nourished, well developed, in no acute distress with non-toxic appearance HEENT: normocephalic, atraumatic, moist mucous membranes Neck: supple, non-tender without lymphadenopathy CV: regular rate and rhythm without murmurs, rubs, or gallops, no lower extremity edema Lungs: clear to auscultation bilaterally with normal work of breathing Abdomen: soft, non-tender, non-distended, no masses or organomegaly palpable, normoactive bowel sounds Skin: warm, dry, no rashes or lesions, cap refill < 2 seconds Extremities: warm and well perfused, normal tone  Assessment & Plan:   No problem-specific Assessment & Plan notes found for this encounter.  No orders of the defined types were placed in this encounter.  No orders of the defined types were placed in this encounter.   Durward Parcelavid Umeka Wrench, DO [facility], PGY-2 11/13/2016 1:24 PM

## 2016-11-19 ENCOUNTER — Telehealth: Payer: Self-pay | Admitting: *Deleted

## 2016-11-19 NOTE — Telephone Encounter (Signed)
[facility] called to inform PCP that patient no-showed appointment for colonoscopy.

## 2016-11-25 NOTE — Congregational Nurse Program (Signed)
Congregational Nurse Program Note  Date of Encounter: 11/02/2016  Past Medical History: Past Medical History:  Diagnosis Date  . Arthritis   . COPD (chronic obstructive pulmonary disease) ([facility])   . Diabetes mellitus without complication ([facility])   . Gout   . Hypertension     Encounter Details:     CNP Questionnaire - 11/02/16 1339      Patient Demographics   Is this a new or existing patient? Existing   Patient is considered a/an Not Applicable   Race African-American/Black     Patient Assistance   Location of Patient Assistance [place]   Patient's financial/insurance status Medicaid   Uninsured Patient ([place]/Care Connects) No   Patient referred to apply for the following financial assistance Not Applicable   Food insecurities addressed Not Applicable   Transportation assistance No   Assistance securing medications No   Educational health offerings Hypertension;Diabetes     Encounter Details   Primary purpose of visit Education/Health Concerns;Chronic Illness/Condition Visit   Was an Emergency Department visit averted? Not Applicable   Does patient have a medical provider? Yes   Patient referred to Follow up with established PCP   Was a mental health screening completed? (GAINS tool) No   Does patient have dental issues? No   Does patient have vision issues? No   Does your patient have an abnormal blood pressure today? No   Since previous encounter, have you referred patient for abnormal blood pressure that resulted in a new diagnosis or medication change? No   Does your patient have an abnormal blood glucose today? No   Since previous encounter, have you referred patient for abnormal blood glucose that resulted in a new diagnosis or medication change? No   Was there a life-saving intervention made? No     BP/BG check.

## 2016-12-06 ENCOUNTER — Ambulatory Visit: Payer: Medicaid Other | Admitting: Family Medicine

## 2016-12-06 NOTE — Progress Notes (Deleted)
   Subjective:   Patient ID: Ernest White    DOB: [birth year], 61 y.o. male   MRN: [id number]  CC: "***"  HPI: Ernest White is a 61 y.o. male who presents to clinic today ***. Problems discussed today are as follows:  ***: *** ROS: ***  Complete ROS performed, see HPI for pertinent.  PMFSH: HTN, COPD, NIDDM, gout, ED, tobacco use disorder. Surgical history hernia repair, appendectomy. Family history unremarkable. Smoking status reviewed. Medications reviewed.  Objective:   There were no vitals taken for this visit. Vitals and nursing note reviewed.  General: well nourished, well developed, in no acute distress with non-toxic appearance HEENT: normocephalic, atraumatic, moist mucous membranes Neck: supple, non-tender without lymphadenopathy CV: regular rate and rhythm without murmurs, rubs, or gallops, no lower extremity edema Lungs: clear to auscultation bilaterally with normal work of breathing Abdomen: soft, non-tender, non-distended, no masses or organomegaly palpable, normoactive bowel sounds Skin: warm, dry, no rashes or lesions, cap refill < 2 seconds Extremities: warm and well perfused, normal tone  Assessment & Plan:   No problem-specific Assessment & Plan notes found for this encounter.  No orders of the defined types were placed in this encounter.  No orders of the defined types were placed in this encounter.   Durward Parcel, DO [facility], PGY-2 12/06/2016 10:06 AM

## 2017-01-09 ENCOUNTER — Ambulatory Visit: Payer: Medicaid Other | Admitting: Family Medicine

## 2017-02-03 NOTE — Progress Notes (Deleted)
   Subjective   Patient ID: Ernest White    DOB: 12-06-1955, 61 y.o. male   MRN: [id number]  CC: "***"  HPI: Ernest GlassDwain L Califf is a 61 y.o. male who presents to clinic today for the following:  ***: ***  ***Last seen 6/7 and no show 3 appts. Was given sildenafil for ED. Needs to stop smoking. HM includes optho, colonscopy, flu shot, and A1c.  ROS: see HPI for pertinent.  PMFSH: uncontrolled NIDDM, HTN, ED, tobacco use disorder, COPD, h/o syncope.  Surgical history hernia, appendectomy.  Family history unremarkable.  Smoking status reviewed. Medications reviewed.  Objective   There were no vitals taken for this visit. Vitals and nursing note reviewed.  General: well nourished, well developed, NAD with non-toxic appearance HEENT: normocephalic, atraumatic, moist mucous membranes Neck: supple, non-tender without lymphadenopathy Cardiovascular: regular rate and rhythm without murmurs, rubs, or gallops Lungs: clear to auscultation bilaterally with normal work of breathing Abdomen: soft, non-tender, non-distended, normoactive bowel sounds Skin: warm, dry, no rashes or lesions, cap refill < 2 seconds Extremities: warm and well perfused, normal tone, no edema  Assessment & Plan   No problem-specific Assessment & Plan notes found for this encounter.  No orders of the defined types were placed in this encounter.  No orders of the defined types were placed in this encounter.   Durward Parcelavid Sharlette Jansma, DO [facility], PGY-2 02/03/2017, 8:10 PM

## 2017-02-04 ENCOUNTER — Ambulatory Visit: Payer: Medicaid Other | Admitting: Family Medicine

## 2017-02-08 NOTE — Progress Notes (Deleted)
   Subjective   Patient ID: Ernest GlassDwain L Hulon    DOB: 1955/06/21, 61 y.o. male   MRN: [id number]  CC: "***"  HPI: Ernest White is a 61 y.o. male who presents to clinic today for the following:  ***: ***  ***Last seen 6/7 and no show 3 appts. Was given sildenafil for ED. Needs to stop smoking. HM includes optho, colonscopy, flu shot, and A1c.  ROS: see HPI for pertinent.  PMFSH: uncontrolled NIDDM, HTN, ED, tobacco use disorder, COPD, h/o syncope.  Surgical history hernia, appendectomy.  Family history unremarkable.  Smoking status reviewed. Medications reviewed.  Objective   There were no vitals taken for this visit. Vitals and nursing note reviewed.  General: well nourished, well developed, NAD with non-toxic appearance HEENT: normocephalic, atraumatic, moist mucous membranes Neck: supple, non-tender without lymphadenopathy Cardiovascular: regular rate and rhythm without murmurs, rubs, or gallops Lungs: clear to auscultation bilaterally with normal work of breathing Abdomen: soft, non-tender, non-distended, normoactive bowel sounds Skin: warm, dry, no rashes or lesions, cap refill < 2 seconds Extremities: warm and well perfused, normal tone, no edema  Assessment & Plan   No problem-specific Assessment & Plan notes found for this encounter.  No orders of the defined types were placed in this encounter.  No orders of the defined types were placed in this encounter.   Durward Parcelavid McMullen, DO [facility], PGY-2 02/08/2017, 9:05 AM

## 2017-02-12 ENCOUNTER — Ambulatory Visit: Payer: Medicaid Other | Admitting: Family Medicine

## 2017-02-23 ENCOUNTER — Encounter: Payer: Self-pay | Admitting: Family Medicine

## 2017-02-23 NOTE — Patient Instructions (Signed)
Dr. Abelardo DieselMcMullen discussed patient's habitual no-show to his appointment as well as last minute cancellation which is impeding the health care he provides to this patient. On 07/16/2016, there is a telephone note documentation from Dr. Abelardo DieselMcMullen discussing this issue with the patient. I will mail him a letter regarding his frequent no-show and how it might impact his future care in our practice. At some point, we might need to put him scheduling review.   Dr. Abelardo DieselMcMullen, please let us know if there is another cancellation or no-show after mailing him a letter. Thanks.  Past appointment report: 10/22/16 last min cancellation. 11/05/16 No-show. 11/13/16 No-show. 12/06/16 No-show. 01/09/17 last min cancellation. 02/04/17 last min cancellation. 02/12/17 No-show.

## 2017-03-14 ENCOUNTER — Encounter: Payer: Self-pay | Admitting: Family Medicine

## 2017-03-14 ENCOUNTER — Other Ambulatory Visit: Payer: Self-pay

## 2017-03-14 ENCOUNTER — Ambulatory Visit (INDEPENDENT_AMBULATORY_CARE_PROVIDER_SITE_OTHER): Payer: Medicaid Other | Admitting: Family Medicine

## 2017-03-14 DIAGNOSIS — J111 Influenza due to unidentified influenza virus with other respiratory manifestations: Secondary | ICD-10-CM

## 2017-03-14 MED ORDER — IBUPROFEN 600 MG PO TABS: 600.0000 mg | ORAL_TABLET | Freq: Three times a day (TID) | ORAL | 0 refills | Status: DC | PRN

## 2017-03-14 MED ORDER — OSELTAMIVIR PHOSPHATE 75 MG PO CAPS
75.0000 mg | ORAL_CAPSULE | Freq: Two times a day (BID) | ORAL | 0 refills | Status: DC
Start: 2017-03-14 — End: 2017-04-10

## 2017-03-14 NOTE — Assessment & Plan Note (Signed)
Possible flu.  Given comorbidities will treat even though > 48 hours of symptoms and no fever.  He does not seem to be having any respiratory signs with normal lung exam

## 2017-03-14 NOTE — Patient Instructions (Addendum)
Good to see you today!  Thanks for coming in.  For the body aches take ibuprofen 600 three times a day as needed  Tamiflu 75 mg twice a day for next 5 days - get this as soon as possible  If you get high fever or have a lot of trouble breathing come back  Make an appointment to see Dr Abelardo DieselMcMullen for diabetes and blood pressure and ED.  Bring all your medications

## 2017-03-14 NOTE — Progress Notes (Signed)
Subjective  Patient is presenting with the following illnesses  URI  Major symptoms: body aches, chills, sore throat Has been sick for 3 days days. Progression: about the same Medications tried: none Sick contacts: none Patient believes may be caused by not sure  Symptoms Fever: chills Headache or face pain: no Tooth pain: no Sneezing: no Scratchy throat: yes Allergies: no Muscle aches: yes very bad Severe fatigue: yes Stiff neck: no Shortness of breath: no - has not been using his inhalers - still smoking Rash: no Sore throat or swollen glands: mild    ROS see HPI Smoking Status noted   Chief Complaint noted Review of Symptoms - see HPI PMH - Smoking status noted.  Diabetes, HYPERTENSION, COPD   Objective Vital Signs reviewed Alert talkative NAD' Lungs:  Normal respiratory effort, chest expands symmetrically. Lungs are clear to auscultation, no crackles or wheezes. Heart - Regular rate and rhythm.  No murmurs, gallops or rubs.    Skin:  Intact without suspicious lesions or rashes Neck:  No deformities, thyromegaly, masses, or tenderness noted.   Supple with full range of motion without pain. Throat: normal mucosa, no exudate, uvula midline, no redness Ears:  External ear exam shows no significant lesions or deformities.  Otoscopic examination reveals clear canals, tympanic membranes are intact bilaterally without bulging, retraction, inflammation or discharge. Hearing is grossly normal bilaterall     Assessments/Plans  Influenza Possible flu.  Given comorbidities will treat even though > 48 hours of symptoms and no fever.  He does not seem to be having any respiratory signs with normal lung exam    See after visit summary for details of patient instuctions

## 2017-03-20 ENCOUNTER — Ambulatory Visit (INDEPENDENT_AMBULATORY_CARE_PROVIDER_SITE_OTHER): Payer: Medicaid Other | Admitting: Family Medicine

## 2017-03-20 ENCOUNTER — Encounter: Payer: Self-pay | Admitting: Family Medicine

## 2017-03-20 ENCOUNTER — Other Ambulatory Visit: Payer: Self-pay

## 2017-03-20 VITALS — BP 132/82 | HR 86 | Temp 98.1°F | Ht 71.0 in | Wt 283.8 lb

## 2017-03-20 DIAGNOSIS — N529 Male erectile dysfunction, unspecified: Secondary | ICD-10-CM | POA: Diagnosis not present

## 2017-03-20 DIAGNOSIS — E119 Type 2 diabetes mellitus without complications: Secondary | ICD-10-CM

## 2017-03-20 DIAGNOSIS — I10 Essential (primary) hypertension: Secondary | ICD-10-CM | POA: Diagnosis not present

## 2017-03-20 DIAGNOSIS — E0865 Diabetes mellitus due to underlying condition with hyperglycemia: Secondary | ICD-10-CM | POA: Diagnosis not present

## 2017-03-20 DIAGNOSIS — J441 Chronic obstructive pulmonary disease with (acute) exacerbation: Secondary | ICD-10-CM | POA: Diagnosis not present

## 2017-03-20 DIAGNOSIS — J449 Chronic obstructive pulmonary disease, unspecified: Secondary | ICD-10-CM | POA: Diagnosis not present

## 2017-03-20 LAB — POCT GLYCOSYLATED HEMOGLOBIN (HGB A1C): Hemoglobin A1C: 7

## 2017-03-20 MED ORDER — FLUTICASONE FUROATE-VILANTEROL 100-25 MCG/INH IN AEPB: 1.0000 | INHALATION_SPRAY | Freq: Every day | RESPIRATORY_TRACT | 1 refills | Status: DC

## 2017-03-20 MED ORDER — METFORMIN HCL 500 MG PO TABS: 500.0000 mg | ORAL_TABLET | Freq: Two times a day (BID) | ORAL | 0 refills | Status: DC

## 2017-03-20 MED ORDER — SILDENAFIL CITRATE 20 MG PO TABS: 20.0000 mg | ORAL_TABLET | Freq: Three times a day (TID) | ORAL | 3 refills | Status: DC

## 2017-03-20 MED ORDER — LISINOPRIL 40 MG PO TABS: 40.0000 mg | ORAL_TABLET | Freq: Every day | ORAL | 3 refills | Status: DC

## 2017-03-20 MED ORDER — SILDENAFIL CITRATE 20 MG PO TABS: 20.0000 mg | ORAL_TABLET | Freq: Every day | ORAL | 3 refills | Status: DC | PRN

## 2017-03-20 MED ORDER — ALBUTEROL SULFATE HFA 108 (90 BASE) MCG/ACT IN AERS: 2.0000 | INHALATION_SPRAY | Freq: Four times a day (QID) | RESPIRATORY_TRACT | 2 refills | Status: DC | PRN

## 2017-03-20 MED ORDER — HYDROCHLOROTHIAZIDE 25 MG PO TABS: 25.0000 mg | ORAL_TABLET | Freq: Every day | ORAL | 3 refills | Status: DC

## 2017-03-20 MED ORDER — GLUCOSE BLOOD VI STRP: ORAL_STRIP | 12 refills | Status: DC

## 2017-03-20 NOTE — Assessment & Plan Note (Signed)
Patient currently still smoking. He reports his symptoms are well controlled but he is out of Albuterol. He is currently taking 1 puff of Breo every day.  I have sent in a refill for both his Breo and Albuterol.

## 2017-03-20 NOTE — Patient Instructions (Signed)
It was great to meet you today! Thank you for letting me participate in your care!  Today, we discussed your Type 2 Diabetes. It is currently well controlled with your current medication regimen. I did not make any changes to your medication today but it be contributing to your nausea. Please follow up with your PCP about this at your next appointment.  Your blood pressure is well controlled today as well. I have updated your medications as to what you are taking that you reported to me today. Please continue taking them as prescribed.  I am also getting a routine blood work today and checking your vit B12 level. If any of the results are abnormal you will be notified.  Refills of your requested medications have all been sent to the pharmacy at [facility] as you have requested.  Thank you and again, it was a pleasure to be a part of your care.  Be well, Jules Schickim Melah Ebling, DO PGY-1, [facility]

## 2017-03-20 NOTE — Assessment & Plan Note (Signed)
Refilled sildenafil 20mg  TID for patient as he states that he is still having ED and needs medication to get an maintain an erection.  Informed patient to try 3 pills 5930min-1 hr before sexual intercourse. Can take up to 5 per day total.

## 2017-03-20 NOTE — Assessment & Plan Note (Signed)
Today HgbA1c is below goal at 7.0 and well controlled on Metformin 500mg  BID.  Patient endorsing frequent nausea and diarrhea. I made no changes to his medications today but I would consider moving to Metformin XR to see if that provides him with some relief. Informed patient since his blood glucose is under such good control to continue Metformin for now.

## 2017-03-20 NOTE — Progress Notes (Signed)
Subjective: No chief complaint on file.    HPI: Ernest White is a 62 y.o. presenting to clinic today to discuss the following:  HTN: Chronic. Patient reports he is taking a higher dose of lisinopril, stopped taking amlodipine due to concerns that it was making his gout worse, and was started on HCTZ. No complaints of headache, blurry vision, syncope.  DM: Patient reports he is taking Metformin as prescribed 500mg  BID but that he believes it is the main cause of his current problems with erectile dysfunction as he did not have this before he started Metformin. He also states he frequently gets nauseated  Erectile Dysfunction: Patient states he is still having ED and requires refill of sildenafil for relief of symptoms. No reported side effects from taking sildenfil.  COPD: Patient reports symptoms are well controlled as he is not waking up from sleep with SOB but he has not had his Albuterol inhaler. States he has not needed it recently.  Review of Systems  Constitutional: Positive for malaise/fatigue. Negative for chills, diaphoresis and fever.  HENT: Negative for congestion, hearing loss, sinus pain, sore throat and tinnitus.   Eyes: Negative for blurred vision and double vision.  Respiratory: Positive for shortness of breath. Negative for cough and hemoptysis.   Cardiovascular: Negative for chest pain, palpitations and leg swelling.  Gastrointestinal: Positive for abdominal pain, diarrhea and nausea. Negative for blood in stool, constipation and vomiting.  Neurological: Negative for headaches.     Health Maintenance: Not performed today     ROS noted in HPI.   Past Medical, Surgical, Social, and Family History Reviewed & Updated per EMR.   Pertinent Historical Findings include:   Social History   Tobacco Use  Smoking Status Current Every Day Smoker  . Packs/day: 1.00  . Years: 48.00  . Pack years: 48.00  . Types: Cigarettes  Smokeless Tobacco Never Used    Tobacco Comment   Works at tobacco plant      Objective: BP 132/82   Pulse 86   Temp 98.1 F (36.7 C) (Oral)   Ht 5\' 11"  (1.803 m)   Wt 283 lb 12.8 oz (128.7 kg)   SpO2 95%   BMI 39.58 kg/m  Vitals and nursing notes reviewed  Physical Exam  Constitutional: He is oriented to person, place, and time and well-developed, well-nourished, and in no distress. No distress.  HENT:  Head: Normocephalic and atraumatic.  Eyes: Conjunctivae and EOM are normal. Pupils are equal, round, and reactive to light.  Cardiovascular: Normal rate, regular rhythm, normal heart sounds and intact distal pulses.  No murmur heard. Pulmonary/Chest: Effort normal and breath sounds normal. No respiratory distress. He has no wheezes. He has no rales.  Abdominal: Soft. Bowel sounds are normal. He exhibits no distension. There is tenderness. There is no rebound and no guarding.  Patient has old well-healed surgical scars in LLQ, at the navel, and on the RLQ.  Musculoskeletal: Normal range of motion. He exhibits no edema.  Neurological: He is alert and oriented to person, place, and time.  Skin: Skin is warm and dry. No rash noted. No erythema.  Psychiatric: Mood normal.     Results for orders placed or performed in visit on 03/20/17 (from the past 72 hour(s))  HgB A1c     Status: None   Collection Time: 03/20/17  1:51 PM  Result Value Ref Range   Hemoglobin A1C 7.0     Assessment/Plan:  Primary hypertension Patient is well  controlled at today's visit with BP below goal at 132/82.  He reports that he is currently taking Lisinopril 40mg  daily and HCTZ 25mg  daily.  I refilled both prescriptions today. However, I would consider changing HCTZ to something else due to his history of gout.  Chronic obstructive airway disease (HCC) Patient currently still smoking. He reports his symptoms are well controlled but he is out of Albuterol. He is currently taking 1 puff of Breo every day.  I have sent in a  refill for both his Breo and Albuterol.  Erectile dysfunction Refilled sildenafil 20mg  TID for patient as he states that he is still having ED and needs medication to get an maintain an erection.  Informed patient to try 3 pills 93min-1 hr before sexual intercourse. Can take up to 5 per day total.  Type 2 diabetes mellitus without complication, without long-term current use of insulin (HCC) Today HgbA1c is below goal at 7.0 and well controlled on Metformin 500mg  BID.  Patient endorsing frequent nausea and diarrhea. I made no changes to his medications today but I would consider moving to Metformin XR to see if that provides him with some relief. Informed patient since his blood glucose is under such good control to continue Metformin for now.     PATIENT EDUCATION PROVIDED: See AVS    Diagnosis and plan along with any newly prescribed medication(s) were discussed in detail with this patient today. The patient verbalized understanding and agreed with the plan. Patient advised if symptoms worsen return to clinic or ER.   Health Maintainance:   Orders Placed This Encounter  Procedures  . HgB A1c    Meds ordered this encounter  Medications  . sildenafil (REVATIO) 20 MG tablet    Sig: Take 1 tablet (20 mg total) by mouth 5 (five) times daily as needed.    Dispense:  100 tablet    Refill:  3  . lisinopril (PRINIVIL,ZESTRIL) 40 MG tablet    Sig: Take 1 tablet (40 mg total) by mouth daily.    Dispense:  90 tablet    Refill:  3  . metFORMIN (GLUCOPHAGE) 500 MG tablet    Sig: Take 1 tablet (500 mg total) by mouth 2 (two) times daily with a meal.    Dispense:  60 tablet    Refill:  0    [facility] Ind[facility] fund to cover co-pay one time  . glucose blood (ACCU-CHEK AVIVA PLUS) test strip    Sig: Use as directed to check blood glucose up to twice daily. E11.9.  May substitute for formulary preferred.    Dispense:  100 each    Refill:  12  . hydrochlorothiazide (HYDRODIURIL) 25 MG tablet     Sig: Take 1 tablet (25 mg total) by mouth daily.    Dispense:  90 tablet    Refill:  3  . fluticasone furoate-vilanterol (BREO ELLIPTA) 100-25 MCG/INH AEPB    Sig: Inhale 1 puff into the lungs daily.    Dispense:  60 each    Refill:  1  . albuterol (PROVENTIL HFA;VENTOLIN HFA) 108 (90 Base) MCG/ACT inhaler    Sig: Inhale 2 puffs into the lungs every 6 (six) hours as needed for wheezing or shortness of breath.    Dispense:  1 Inhaler    Refill:  2     Jules Schick, DO 03/20/2017, 1:42 PM PGY-1, [facility]

## 2017-03-20 NOTE — Assessment & Plan Note (Signed)
Patient is well controlled at today's visit with BP below goal at 132/82.  He reports that he is currently taking Lisinopril 40mg  daily and HCTZ 25mg  daily.  I refilled both prescriptions today. However, I would consider changing HCTZ to something else due to his history of gout.

## 2017-03-21 LAB — COMPREHENSIVE METABOLIC PANEL
A/G RATIO: 1.3 (ref 1.2–2.2)
ALBUMIN: 4.3 g/dL (ref 3.6–4.8)
ALT: 24 IU/L (ref 0–44)
AST: 21 IU/L (ref 0–40)
Alkaline Phosphatase: 56 IU/L (ref 39–117)
BUN / CREAT RATIO: 12 (ref 10–24)
BUN: 13 mg/dL (ref 8–27)
Bilirubin Total: 0.3 mg/dL (ref 0.0–1.2)
CALCIUM: 9.8 mg/dL (ref 8.6–10.2)
CO2: 27 mmol/L (ref 20–29)
Chloride: 97 mmol/L (ref 96–106)
Creatinine, Ser: 1.13 mg/dL (ref 0.76–1.27)
GFR, EST AFRICAN AMERICAN: 80 mL/min/{1.73_m2} (ref >59)
GFR, EST NON AFRICAN AMERICAN: 69 mL/min/{1.73_m2} (ref >59)
GLOBULIN, TOTAL: 3.2 g/dL (ref 1.5–4.5)
Glucose: 133 mg/dL — ABNORMAL HIGH (ref 65–99)
POTASSIUM: 4 mmol/L (ref 3.5–5.2)
SODIUM: 139 mmol/L (ref 134–144)
TOTAL PROTEIN: 7.5 g/dL (ref 6.0–8.5)

## 2017-03-21 LAB — VITAMIN B12: Vitamin B-12: 1640 pg/mL — ABNORMAL HIGH (ref 232–1245)

## 2017-03-22 ENCOUNTER — Telehealth: Payer: Self-pay | Admitting: *Deleted

## 2017-03-22 ENCOUNTER — Encounter: Payer: Self-pay | Admitting: Family Medicine

## 2017-03-22 NOTE — Progress Notes (Signed)
Normal results will be mailed to patient

## 2017-03-22 NOTE — Telephone Encounter (Signed)
Received fax from [facility] requesting prior authorization of Breo Ellipta.  Form placed in MD's box for completion along with Medicaid formulary.  Fredderick SeveranceUCATTE, Velmer Broadfoot L, RN

## 2017-03-26 ENCOUNTER — Other Ambulatory Visit: Payer: Self-pay | Admitting: Family Medicine

## 2017-03-26 MED ORDER — BECLOMETHASONE DIPROP HFA 40 MCG/ACT IN AERB: 2.0000 | INHALATION_SPRAY | Freq: Two times a day (BID) | RESPIRATORY_TRACT | 3 refills | Status: DC

## 2017-03-27 ENCOUNTER — Telehealth: Payer: Self-pay | Admitting: *Deleted

## 2017-03-27 NOTE — Telephone Encounter (Signed)
Received fax from [facility] requesting prior authorization of Qvar .  Form placed in MD's box for completion along with Medicaid formulary.  Kaleab Frasier, Maryjo RochesterJessica Dawn, CMA

## 2017-03-29 ENCOUNTER — Other Ambulatory Visit: Payer: Self-pay | Admitting: Family Medicine

## 2017-03-29 ENCOUNTER — Telehealth: Payer: Self-pay | Admitting: *Deleted

## 2017-03-29 DIAGNOSIS — J439 Emphysema, unspecified: Secondary | ICD-10-CM

## 2017-03-29 MED ORDER — BUDESONIDE 0.5 MG/2ML IN SUSP: 0.5000 mg | Freq: Every day | RESPIRATORY_TRACT | 12 refills | Status: DC

## 2017-03-29 NOTE — Telephone Encounter (Signed)
Received message on nurse line from May Ann with [facility] requesting clarification on Sildenafil. Rx for 1 tab 5 times daily. Usually, written as 1-5 tabs daily as needed. Please call Chales AbrahamsMary Ann at [phone]

## 2017-04-02 ENCOUNTER — Other Ambulatory Visit: Payer: Self-pay

## 2017-04-03 MED ORDER — LISINOPRIL 40 MG PO TABS: 40.0000 mg | ORAL_TABLET | Freq: Every day | ORAL | 3 refills | Status: DC

## 2017-04-03 MED ORDER — HYDROCHLOROTHIAZIDE 25 MG PO TABS: 25.0000 mg | ORAL_TABLET | Freq: Every day | ORAL | 3 refills | Status: DC

## 2017-04-04 ENCOUNTER — Telehealth: Payer: Self-pay | Admitting: *Deleted

## 2017-04-04 NOTE — Telephone Encounter (Signed)
[facility] requesting return call from PCP to discuss COPD Regimen. States patient has Rx for SunocoBreo, Qvar and pulmicort. Needs clarification. Kinnie FeilL. Ducatte, RN, BSN

## 2017-04-08 MED FILL — SILDENAFIL 20 MG TABLET: 20 | 30 days supply | Qty: 100 | Fill #0

## 2017-04-08 MED FILL — LISINOPRIL 40 MG TABLET: 40 | 30 days supply | Qty: 30 | Fill #0

## 2017-04-08 MED FILL — metFORMIN HCL 500 MG TABS: 500 | 30 days supply | Qty: 60 | Fill #0

## 2017-04-08 MED FILL — HYDROCHLOROTHIAZIDE 25 MG T: 25 | 90 days supply | Qty: 90 | Fill #0

## 2017-04-08 MED FILL — PROAIR HFA 90 MCG INHALER: 108 (90 BAS | 25 days supply | Qty: 9 | Fill #0

## 2017-04-10 ENCOUNTER — Telehealth: Payer: Self-pay | Admitting: Family Medicine

## 2017-04-10 ENCOUNTER — Other Ambulatory Visit: Payer: Self-pay | Admitting: Family Medicine

## 2017-04-10 NOTE — Telephone Encounter (Signed)
Contact patient regarding confusion relating to COPD medications.  [facility] with [facility] and [facility] to clarify COPD regimen which will include albuterol inhaler as needed, Spiriva daily, and called a new prescription for Symbicort 160 mcg 2 puffs daily.  Left voicemail informing patient of change to medications. Patient understands and will pick up medication.  Patient did have concerns regarding referral to [facility] for screening colonoscopy.  He states he would need to have the operation done in the hospital if he cannot be accompanied by a family member or friend during the procedure due to the need for transportation home following sedation.  Will [facility] with [facility] and follow-up accordingly.  Durward Parcelavid Aysiah Jurado, DO [facility], PGY-2

## 2017-04-10 NOTE — Telephone Encounter (Signed)
Form is not in providers box.  Will forward to MD to check status.   Nikeisha Klutz, Maryjo RochesterJessica Dawn, CMA

## 2017-04-10 NOTE — Telephone Encounter (Signed)
Canceled prescription for Qvar, Pulmicort, and Breo.  Pharmacy aware of new prescription for Symbicort.  See phone note 04/10/2017.  Thanks.

## 2017-04-11 ENCOUNTER — Telehealth: Payer: Self-pay | Admitting: Licensed Clinical Social Worker

## 2017-04-11 NOTE — Progress Notes (Signed)
Type of Service: Clinical Social Work  Social work consult from Dr. Abelardo DieselMcMullen, reference patient needs to schedule appointment with [facility] for Colonoscopy screening, however he has no one to accompany him to the appointment.   LCSW called patient to assess the need. Patient recently moved to [place] ago from [place].  Has no friends in [place], brother, sister and niece all live in [place].  The following was discussed;options for family member to come to [place] for the appointment, Transportation to and from appointment, importance of having the screening done, and other possible barriers.  Patient was visiting his brother when LCSW called, which was the perfect time for him to have the conversation.  Patient was appreciative of the call. Intervention: emotional support, Problem solving and solution focus   Plan:  1. Patient will talk with family to see if one of them is able to accompany him to the appointment   2. LCSW will call patient in 5 to 7 days for an update.  Sammuel Hineseborah Moore, LCSW Licensed Clinical Social Worker [facility]   [phone] 11:50 AM

## 2017-04-16 ENCOUNTER — Telehealth: Payer: Self-pay

## 2017-04-16 ENCOUNTER — Telehealth: Payer: Self-pay | Admitting: Licensed Clinical Social Worker

## 2017-04-16 NOTE — Progress Notes (Signed)
Service : Clinical Social Work F/U Call   F/U call to patient reference plan for transportation to colonoscopy. Patient's had an appointment scheduled Jan. 30th but missed the appointment. LCSW has explored community options and there are no services to assist patient to and from appointment as well as wait 4 to 5 hours during the procedure. [employer] will only commit to 3 hours total including both transportation and appointment.   Patient has also spoken with his brother, sister and niece.  Neither are able to assist. Patient appreciative of F/U call.  Plan: Patient will continue to ask family and friends in [place] to see if they are willing to transport him to the appointment and wait during the procedure.  He will provide PCP an update on 04/23/17 during his F/U appointment.  Update shared with PCP via in-basket.  Sammuel Hineseborah Moore, LCSW Licensed Clinical Social Worker [facility]   [phone] 2:24 PM

## 2017-04-16 NOTE — Telephone Encounter (Signed)
[facility] requesting prior authorization of Symbicort  Form placed in MD's box for completion along with Medicaid formulary.Symbicort is the preferred product but requires PA due to being a combination product. Ples SpecterAlisa Brake, RN [facility] RN)

## 2017-04-18 NOTE — Telephone Encounter (Signed)
APPROVED 04/18/2017 - 04/18/2018  Prior approval for symbicort completed via [employer].  Med approved for 2.7.19 - 2.7.20  Prior approval # [id number].  [facility] pharmacy informed.  Shealynn Saulnier, Maryjo RochesterJessica Dawn, CMA

## 2017-04-22 MED FILL — ACCU-CHEK AVIVA PLUS TEST S: 25 days supply | Qty: 50 | Fill #0

## 2017-04-23 ENCOUNTER — Other Ambulatory Visit: Payer: Self-pay

## 2017-04-23 ENCOUNTER — Encounter: Payer: Self-pay | Admitting: Family Medicine

## 2017-04-23 ENCOUNTER — Ambulatory Visit (INDEPENDENT_AMBULATORY_CARE_PROVIDER_SITE_OTHER): Payer: Medicaid Other | Admitting: Family Medicine

## 2017-04-23 VITALS — BP 126/88 | HR 94 | Temp 97.9°F | Ht 71.0 in | Wt 285.2 lb

## 2017-04-23 DIAGNOSIS — R0683 Snoring: Secondary | ICD-10-CM | POA: Diagnosis not present

## 2017-04-23 DIAGNOSIS — K644 Residual hemorrhoidal skin tags: Secondary | ICD-10-CM

## 2017-04-23 DIAGNOSIS — F17200 Nicotine dependence, unspecified, uncomplicated: Secondary | ICD-10-CM

## 2017-04-23 DIAGNOSIS — I10 Essential (primary) hypertension: Secondary | ICD-10-CM

## 2017-04-23 MED ORDER — NICOTINE 14 MG/24HR TD PT24: 14.0000 mg | MEDICATED_PATCH | Freq: Every day | TRANSDERMAL | 8 refills | Status: DC

## 2017-04-23 NOTE — Assessment & Plan Note (Addendum)
Chronic.  Patient does endorse rectal bleeding.  Likely due to multifactorial sources including morbid obesity, increased stress, HTN. - Instructed patient to take over-the-counter Metamucil and MiraLAX - Advised patient to contact [facility] given recent referral to discuss options for possible banding - CBC with differential to assess for anemia

## 2017-04-23 NOTE — Assessment & Plan Note (Addendum)
Chronic.  No prior history of nocturnal polysomnogram.  Symptoms of insomnia and poor rest are highly suspicious for OSA. - Nocturnal polysomnogram ordered

## 2017-04-23 NOTE — Progress Notes (Signed)
   Subjective   Patient ID: Ernest Glasswain L Grable    DOB: Oct 12, 1955, 62 y.o. male   MRN: [id number]  CC: "Hemorrhoids"  HPI: Ernest GlassDwain L White is a 62 y.o. male who presents to clinic today for the following:  Hemorrhoids: Patient has a long history of external hemorrhoids with worsening symptoms over the last 3 months.  He endorses bright red blood per rectum when passing stool.  He also endorses feelings of fatigue, however this is been an ongoing issue for many years.  He was recently evaluated [facility] earlier this month for colonoscopy evaluation.  Smoking: Patient is an everyday smoker with 1 pack/day for 50 years.  He has tried quitting twice and endorsed vivid dreams with Chantix use.  He is interested in cessation today.  OSA: Continues to have feelings of daytime somnolence following difficulty sleeping nightly.  He does endorse snoring regularly.  Patient denies history of nocturnal polysomnogram.  ROS: see HPI for pertinent.  PMFSH: HTN, COPD, NIDT2DM, tobacco use disorder, ED.  Surgical history appendectomy and hernia.  Family history unremarkable.  Smoking status reviewed.  Medications reviewed.  Objective   BP 126/88   Pulse 94   Temp 97.9 F (36.6 C) (Oral)   Ht 5\' 11"  (1.803 m)   Wt 285 lb 3.2 oz (129.4 kg)   SpO2 98%   BMI 39.78 kg/m  Vitals and nursing note reviewed.  General: obese, NAD with non-toxic appearance HEENT: normocephalic, atraumatic, moist mucous membranes Neck: supple, non-tender without lymphadenopathy Cardiovascular: regular rate and rhythm without murmurs, rubs, or gallops Lungs: clear to auscultation bilaterally with normal work of breathing Abdomen: soft, non-tender, non-distended, normoactive bowel sounds Skin: warm, dry, no rashes or lesions, cap refill < 2 seconds Extremities: warm and well perfused, normal tone, no edema  Assessment & Plan   External hemorrhoids Chronic.  Patient does endorse rectal bleeding.  Likely due to  multifactorial sources including morbid obesity, increased stress, HTN. - Instructed patient to take over-the-counter Metamucil and MiraLAX - Advised patient to contact [facility] given recent referral to discuss options for possible banding - CBC with differential to assess for anemia  Tobacco use disorder Running.  Has a 50-pack-year history.  Has underlying COPD, HTN, and HLD.  Quit day set for 06/21/17. - Given prescription for nicotine patch 14 mg daily  Snoring Chronic.  No prior history of nocturnal polysomnogram.  Symptoms of insomnia and poor rest are highly suspicious for OSA. - Nocturnal polysomnogram ordered  Orders Placed This Encounter  Procedures  . CBC with Differential/Platelet  . Nocturnal polysomnography (NPSG)    Standing Status:   Future    Standing Expiration Date:   04/23/2018    Order Specific Question:   Where should this test be performed:    Answer[facility]   [facility]   Meds ordered this encounter  Medications  . nicotine (NICODERM CQ - DOSED IN MG/24 HOURS) 14 mg/24hr patch    Sig: Place 1 patch (14 mg total) onto the skin daily.    Dispense:  21 patch    Refill:  8    Durward Parcelavid Indya Oliveria, DO [facility], PGY-2 04/23/2017, 5:12 PM

## 2017-04-23 NOTE — Patient Instructions (Signed)
Thank you for coming in to see us today. Please see below to review our plan for today's visit.  1.  Please contact [facility] and request to be evaluated for your external hemorrhoids.  You can take over-the-counter Metamucil and MiraLAX to bulk your stool to prevent worsening of your symptoms.  Avoid prolonged sitting as this may worsen your hemorrhoids. 2.  Concerning your smoking, I am pleased to hear that you are interested in cessation.  I have provided you a prescription for nicotine patch which should be covered by Medicaid.  He will place 1 patch on your shoulder daily.  He will need to also be sure to intentionally decrease your smoking use while on these patches.  You should start feeling a loss of pleasure while smoking when using these.  I will have a follow-up in 1 month to reassess. 3.  I have placed an order for a sleep study.  Their office will contact you regarding getting this arranged.  Please call the clinic at [phone] if your symptoms worsen or you have any concerns. It was our pleasure to serve you.  Durward Parcelavid McMullen, DO [facility], PGY-2

## 2017-04-23 NOTE — Assessment & Plan Note (Addendum)
Running.  Has a 50-pack-year history.  Has underlying COPD, HTN, and HLD.  Quit day set for 06/21/17. - Given prescription for nicotine patch 14 mg daily

## 2017-04-24 ENCOUNTER — Encounter: Payer: Self-pay | Admitting: Family Medicine

## 2017-04-24 LAB — CBC WITH DIFFERENTIAL/PLATELET
Basophils Absolute: 0 10*3/uL (ref 0.0–0.2)
Basos: 0 %
EOS (ABSOLUTE): 0.1 10*3/uL (ref 0.0–0.4)
EOS: 1 %
HEMATOCRIT: 40.5 % (ref 37.5–51.0)
Hemoglobin: 13.2 g/dL (ref 13.0–17.7)
IMMATURE GRANULOCYTES: 0 %
Immature Grans (Abs): 0 10*3/uL (ref 0.0–0.1)
LYMPHS ABS: 4.1 10*3/uL — AB (ref 0.7–3.1)
Lymphs: 51 %
MCH: 29.2 pg (ref 26.6–33.0)
MCHC: 32.6 g/dL (ref 31.5–35.7)
MCV: 90 fL (ref 79–97)
MONOS ABS: 0.5 10*3/uL (ref 0.1–0.9)
Monocytes: 6 %
Neutrophils Absolute: 3.4 10*3/uL (ref 1.4–7.0)
Neutrophils: 42 %
PLATELETS: 245 10*3/uL (ref 150–379)
RBC: 4.52 x10E6/uL (ref 4.14–5.80)
RDW: 16.2 % — AB (ref 12.3–15.4)
WBC: 8.1 10*3/uL (ref 3.4–10.8)

## 2017-05-09 ENCOUNTER — Ambulatory Visit (HOSPITAL_BASED_OUTPATIENT_CLINIC_OR_DEPARTMENT_OTHER): Payer: Medicaid Other | Attending: Family Medicine | Admitting: Internal Medicine

## 2017-05-09 DIAGNOSIS — R0683 Snoring: Secondary | ICD-10-CM | POA: Diagnosis present

## 2017-05-09 DIAGNOSIS — I493 Ventricular premature depolarization: Secondary | ICD-10-CM | POA: Insufficient documentation

## 2017-05-09 DIAGNOSIS — G4700 Insomnia, unspecified: Secondary | ICD-10-CM | POA: Insufficient documentation

## 2017-05-15 DIAGNOSIS — R0683 Snoring: Secondary | ICD-10-CM

## 2017-05-15 NOTE — Procedures (Signed)
   Patient Name: Ernest White, Francois Study Date: 05/09/2017 Gender: Male D.O.B: Apr 14, 1955 Age (years): 61 Referring Provider: Doralee AlbinoWilliam Hensel Height (inches): 71 Interpreting Physician: Jetty Duhamellinton Orpah Hausner MD, ABSM Weight (lbs): 285 RPSGT: Armen PickupFord, Evelyn BMI: 40 MRN: [id number] Neck Size: 18.50 <br> <br> CLINICAL INFORMATION Sleep Study Type: NPSG  Indication for sleep study: Snoring Epworth Sleepiness Score:  16  SLEEP STUDY TECHNIQUE As per the AASM Manual for the Scoring of Sleep and Associated Events v2.3 (April 2016) with a hypopnea requiring 4% desaturations.  The channels recorded and monitored were frontal, central and occipital EEG, electrooculogram (EOG), submentalis EMG (chin), nasal and oral airflow, thoracic and abdominal wall motion, anterior tibialis EMG, snore microphone, electrocardiogram, and pulse oximetry.  MEDICATIONS Medications self-administered by patient taken the night of the study : none reported  SLEEP ARCHITECTURE The study was initiated at 10:35:22 PM and ended at 4:28:38 AM.  Sleep onset time was 4.6 minutes and the sleep efficiency was 23.6%%. The total sleep time was 83.5 minutes.  Stage REM latency was N/A minutes.  The patient spent 15.6%% of the night in stage N1 sleep, 84.4%% in stage N2 sleep, 0.0%% in stage N3 and 0.00% in REM.  Alpha intrusion was absent.  Supine sleep was 4.19%.  RESPIRATORY PARAMETERS The overall apnea/hypopnea index (AHI) was 5.0 per hour. There were 1 total apneas, including 1 obstructive, 0 central and 0 mixed apneas. There were 6 hypopneas and 5 RERAs.  The AHI during Stage REM sleep was N/A per hour.  AHI while supine was 0.0 per hour.  The mean oxygen saturation was 90.5%. The minimum SpO2 during sleep was 87.0%.  loud snoring was noted during this study.  CARDIAC DATA The 2 lead EKG demonstrated sinus rhythm. The mean heart rate was 85.1 beats per minute. Other EKG findings include: PVCs.  LEG MOVEMENT  DATA The total PLMS were 0 with a resulting PLMS index of 0.0. Associated arousal with leg movement index was 0.0 .  IMPRESSIONS - Mild obstructive sleep apnea occurred during this study (AHI = 5.0/h). Possibly under-estimated due to limited sleep time. - Difficulty maintaining sleep with very limited sleep time. Awake between 11:30 PM and 3:30 AM. This is similar to pattern patient described in home environment. - No significant central sleep apnea occurred during this study (CAI = 0.0/h). - Mild oxygen desaturation was noted during this study (Min O2 = 87.0%). Mean 92.4%. Time - The patient snored with loud snoring volume. - EKG findings include PVCs. - Clinically significant periodic limb movements did not occur during sleep. No significant associated arousals.  DIAGNOSIS - Primary snoring - Insomnia  RECOMMENDATIONS - Occasional obstructive apneas, at upper limit of normal, with loud snoring. Conservative measures including sleep off back, treat nasal congestion, consider chin strap. - Consider managing insomnia. - Sleep hygiene should be reviewed to assess factors that may improve sleep quality. - Weight management and regular exercise should be initiated or continued if appropriate.  [Electronically signed] 05/15/2017 02:41 PM  Jetty Duhamellinton Raynard Mapps MD, ABSM Diplomate, American Board of Sleep Medicine   NPI: [id number]                          Jetty Duhamellinton Alexx Mcburney Diplomate, American Board of Sleep Medicine  ELECTRONICALLY SIGNED ON:  05/15/2017, 2:31 PM [facility] PH: [phone]   FX: [phone] ACCREDITED BY THE AMERICAN ACADEMY OF SLEEP MEDICINE

## 2017-06-17 ENCOUNTER — Other Ambulatory Visit: Payer: Self-pay | Admitting: Family Medicine

## 2017-06-17 MED FILL — ACCU-CHEK AVIVA PLUS TEST S: 25 days supply | Qty: 50 | Fill #1

## 2017-06-18 MED FILL — metFORMIN HCL 500 MG TABS: 500 | 30 days supply | Qty: 60 | Fill #0

## 2017-07-19 ENCOUNTER — Other Ambulatory Visit: Payer: Self-pay | Admitting: Family Medicine

## 2017-07-19 DIAGNOSIS — E119 Type 2 diabetes mellitus without complications: Secondary | ICD-10-CM

## 2017-08-15 ENCOUNTER — Other Ambulatory Visit: Payer: Self-pay | Admitting: Family Medicine

## 2017-09-05 ENCOUNTER — Ambulatory Visit (INDEPENDENT_AMBULATORY_CARE_PROVIDER_SITE_OTHER): Payer: Medicaid Other | Admitting: Family Medicine

## 2017-09-05 ENCOUNTER — Encounter (HOSPITAL_COMMUNITY): Payer: Self-pay | Admitting: Emergency Medicine

## 2017-09-05 ENCOUNTER — Encounter: Payer: Self-pay | Admitting: Family Medicine

## 2017-09-05 ENCOUNTER — Emergency Department (HOSPITAL_COMMUNITY): Payer: Medicaid Other

## 2017-09-05 ENCOUNTER — Other Ambulatory Visit: Payer: Self-pay

## 2017-09-05 ENCOUNTER — Inpatient Hospital Stay (HOSPITAL_COMMUNITY)
Admission: EM | Admit: 2017-09-05 | Discharge: 2017-09-07 | DRG: 176 | Disposition: A | Discharge status: Home / Self Care | Payer: Medicaid Other | Attending: Family Medicine | Admitting: Family Medicine

## 2017-09-05 ENCOUNTER — Ambulatory Visit (HOSPITAL_COMMUNITY)
Admission: RE | Admit: 2017-09-05 | Discharge: 2017-09-05 | Disposition: A | Discharge status: Home / Self Care | Payer: Medicaid Other | Source: Ambulatory Visit | Attending: Family Medicine | Admitting: Family Medicine

## 2017-09-05 VITALS — BP 110/72 | HR 102 | Temp 98.3°F | Wt 291.4 lb

## 2017-09-05 DIAGNOSIS — Z6841 Body Mass Index (BMI) 40.0 and over, adult: Secondary | ICD-10-CM | POA: Diagnosis not present

## 2017-09-05 DIAGNOSIS — Z79899 Other long term (current) drug therapy: Secondary | ICD-10-CM

## 2017-09-05 DIAGNOSIS — J449 Chronic obstructive pulmonary disease, unspecified: Secondary | ICD-10-CM | POA: Diagnosis present

## 2017-09-05 DIAGNOSIS — M1990 Unspecified osteoarthritis, unspecified site: Secondary | ICD-10-CM | POA: Diagnosis present

## 2017-09-05 DIAGNOSIS — E1165 Type 2 diabetes mellitus with hyperglycemia: Secondary | ICD-10-CM | POA: Diagnosis present

## 2017-09-05 DIAGNOSIS — I2699 Other pulmonary embolism without acute cor pulmonale: Principal | ICD-10-CM | POA: Diagnosis present

## 2017-09-05 DIAGNOSIS — E119 Type 2 diabetes mellitus without complications: Secondary | ICD-10-CM

## 2017-09-05 DIAGNOSIS — E669 Obesity, unspecified: Secondary | ICD-10-CM | POA: Diagnosis present

## 2017-09-05 DIAGNOSIS — F17210 Nicotine dependence, cigarettes, uncomplicated: Secondary | ICD-10-CM | POA: Diagnosis present

## 2017-09-05 DIAGNOSIS — Z7984 Long term (current) use of oral hypoglycemic drugs: Secondary | ICD-10-CM | POA: Diagnosis not present

## 2017-09-05 DIAGNOSIS — E0865 Diabetes mellitus due to underlying condition with hyperglycemia: Secondary | ICD-10-CM

## 2017-09-05 DIAGNOSIS — Z886 Allergy status to analgesic agent status: Secondary | ICD-10-CM

## 2017-09-05 DIAGNOSIS — R079 Chest pain, unspecified: Secondary | ICD-10-CM

## 2017-09-05 DIAGNOSIS — N529 Male erectile dysfunction, unspecified: Secondary | ICD-10-CM | POA: Diagnosis present

## 2017-09-05 DIAGNOSIS — Z880 Allergy status to penicillin: Secondary | ICD-10-CM | POA: Diagnosis not present

## 2017-09-05 DIAGNOSIS — M109 Gout, unspecified: Secondary | ICD-10-CM | POA: Diagnosis present

## 2017-09-05 DIAGNOSIS — F17200 Nicotine dependence, unspecified, uncomplicated: Secondary | ICD-10-CM | POA: Diagnosis not present

## 2017-09-05 DIAGNOSIS — I10 Essential (primary) hypertension: Secondary | ICD-10-CM | POA: Diagnosis present

## 2017-09-05 LAB — POCT GLYCOSYLATED HEMOGLOBIN (HGB A1C): HbA1c, POC (controlled diabetic range): 8.1 % — AB (ref 0.0–7.0)

## 2017-09-05 LAB — I-STAT TROPONIN, ED: TROPONIN I, POC: 0 ng/mL (ref 0.00–0.08)

## 2017-09-05 LAB — HEPATIC FUNCTION PANEL
ALBUMIN: 3.6 g/dL (ref 3.5–5.0)
ALT: 27 U/L (ref 0–44)
AST: 26 U/L (ref 15–41)
Alkaline Phosphatase: 55 U/L (ref 38–126)
BILIRUBIN TOTAL: 0.7 mg/dL (ref 0.3–1.2)
Bilirubin, Direct: 0.2 mg/dL (ref 0.0–0.2)
Indirect Bilirubin: 0.5 mg/dL (ref 0.3–0.9)
Total Protein: 6.6 g/dL (ref 6.5–8.1)

## 2017-09-05 LAB — GLUCOSE, CAPILLARY: GLUCOSE-CAPILLARY: 140 mg/dL — AB (ref 70–99)

## 2017-09-05 LAB — BASIC METABOLIC PANEL
Anion gap: 12 (ref 5–15)
BUN: 15 mg/dL (ref 8–23)
CO2: 29 mmol/L (ref 22–32)
Calcium: 9.6 mg/dL (ref 8.9–10.3)
Chloride: 97 mmol/L — ABNORMAL LOW (ref 98–111)
Creatinine, Ser: 1.08 mg/dL (ref 0.61–1.24)
GFR calc Af Amer: >60 mL/min (ref >60)
GFR calc non Af Amer: >60 mL/min (ref >60)
GLUCOSE: 147 mg/dL — AB (ref 70–99)
POTASSIUM: 3.6 mmol/L (ref 3.5–5.1)
Sodium: 138 mmol/L (ref 135–145)

## 2017-09-05 LAB — CBC
HCT: 39.5 % (ref 39.0–52.0)
HEMOGLOBIN: 12.6 g/dL — AB (ref 13.0–17.0)
MCH: 28.6 pg (ref 26.0–34.0)
MCHC: 31.9 g/dL (ref 30.0–36.0)
MCV: 89.6 fL (ref 78.0–100.0)
PLATELETS: 228 10*3/uL (ref 150–400)
RBC: 4.41 MIL/uL (ref 4.22–5.81)
RDW: 15.4 % (ref 11.5–15.5)
WBC: 12.1 10*3/uL — AB (ref 4.0–10.5)

## 2017-09-05 LAB — LIPASE, BLOOD: LIPASE: 44 U/L (ref 11–51)

## 2017-09-05 MED ORDER — IPRATROPIUM-ALBUTEROL 0.5-2.5 (3) MG/3ML IN SOLN: 3.0000 mL | Freq: Four times a day (QID) | RESPIRATORY_TRACT | Status: DC | PRN

## 2017-09-05 MED ORDER — HEPARIN (PORCINE) IN NACL 100-0.45 UNIT/ML-% IJ SOLN
1700.0000 [IU]/h | INTRAMUSCULAR | Status: DC
  Administered 2017-09-05 – 2017-09-06 (×2): 1700 [IU]/h via INTRAVENOUS
  Filled 2017-09-05 (×2): qty 250

## 2017-09-05 MED ORDER — INSULIN ASPART 100 UNIT/ML ~~LOC~~ SOLN
0.0000 [IU] | Freq: Three times a day (TID) | SUBCUTANEOUS | Status: DC
  Administered 2017-09-06: 3 [IU] via SUBCUTANEOUS
  Administered 2017-09-06: 5 [IU] via SUBCUTANEOUS
  Administered 2017-09-06: 3 [IU] via SUBCUTANEOUS
  Administered 2017-09-07: 2 [IU] via SUBCUTANEOUS

## 2017-09-05 MED ORDER — IOPAMIDOL (ISOVUE-370) INJECTION 76%
100.0000 mL | Freq: Once | INTRAVENOUS | Status: AC | PRN
  Administered 2017-09-05: 100 mL via INTRAVENOUS

## 2017-09-05 MED ORDER — HYDROCHLOROTHIAZIDE 25 MG PO TABS
25.0000 mg | ORAL_TABLET | Freq: Every day | ORAL | Status: DC
  Administered 2017-09-05 – 2017-09-07 (×3): 25 mg via ORAL
  Filled 2017-09-05 (×3): qty 1

## 2017-09-05 MED ORDER — TIOTROPIUM BROMIDE MONOHYDRATE 18 MCG IN CAPS
18.0000 ug | ORAL_CAPSULE | Freq: Every day | RESPIRATORY_TRACT | Status: DC
  Administered 2017-09-06 – 2017-09-07 (×2): 18 ug via RESPIRATORY_TRACT
  Filled 2017-09-05: qty 5

## 2017-09-05 MED ORDER — LISINOPRIL 40 MG PO TABS
40.0000 mg | ORAL_TABLET | Freq: Every day | ORAL | Status: DC
  Administered 2017-09-06 – 2017-09-07 (×2): 40 mg via ORAL
  Filled 2017-09-05 (×2): qty 1

## 2017-09-05 MED ORDER — ACETAMINOPHEN 325 MG PO TABS
650.0000 mg | ORAL_TABLET | Freq: Four times a day (QID) | ORAL | Status: DC | PRN
  Administered 2017-09-06 – 2017-09-07 (×5): 650 mg via ORAL
  Filled 2017-09-05 (×5): qty 2

## 2017-09-05 MED ORDER — HEPARIN BOLUS VIA INFUSION
5500.0000 [IU] | Freq: Once | INTRAVENOUS | Status: AC
  Administered 2017-09-05: 5500 [IU] via INTRAVENOUS
  Filled 2017-09-05: qty 5500

## 2017-09-05 MED ORDER — POLYETHYLENE GLYCOL 3350 17 G PO PACK: 17.0000 g | PACK | Freq: Every day | ORAL | Status: DC | PRN

## 2017-09-05 MED ORDER — ACETAMINOPHEN 650 MG RE SUPP: 650.0000 mg | Freq: Four times a day (QID) | RECTAL | Status: DC | PRN

## 2017-09-05 MED ORDER — ONDANSETRON HCL 4 MG/2ML IJ SOLN
4.0000 mg | Freq: Once | INTRAMUSCULAR | Status: AC
  Administered 2017-09-05: 4 mg via INTRAVENOUS
  Filled 2017-09-05: qty 2

## 2017-09-05 MED ORDER — NICOTINE 14 MG/24HR TD PT24
14.0000 mg | MEDICATED_PATCH | Freq: Every day | TRANSDERMAL | Status: DC
  Administered 2017-09-05 – 2017-09-07 (×3): 14 mg via TRANSDERMAL
  Filled 2017-09-05 (×3): qty 1

## 2017-09-05 MED ORDER — IOPAMIDOL (ISOVUE-370) INJECTION 76%
INTRAVENOUS | Status: AC
  Filled 2017-09-05: qty 100

## 2017-09-05 NOTE — Progress Notes (Signed)
ANTICOAGULATION CONSULT NOTE - Initial Consult  Pharmacy Consult for heparin Indication: pulmonary embolus  Allergies  Allergen Reactions  . Penicillins Anaphylaxis    Has patient had a PCN reaction causing immediate rash, facial/tongue/throat swelling, SOB or lightheadedness with hypotension: Yes Has patient had a PCN reaction causing severe rash involving mucus membranes or skin necrosis: No Has patient had a PCN reaction that required hospitalization pt was hospitalized at time of reaction Has patient had a PCN reaction occurring within the last 10 years: No If all of the above answers are "NO", then may proceed with Cephalosporin use.  . Aspirin Other (See Comments)    Stomach pain    Patient Measurements: Height: 5\' 11"  (180.3 cm) Weight: 291 lb 7.2 oz (132.2 kg) IBW/kg (Calculated) : 75.3 Heparin Dosing Weight: 105kg  Vital Signs: Temp: 98.7 F (37.1 C) (06/27 1630) Temp Source: Oral (06/27 1630) BP: 126/58 (06/27 1831) Pulse Rate: 96 (06/27 1831)  Labs: Recent Labs    09/05/17 1636  HGB 12.6*  HCT 39.5  PLT 228  CREATININE 1.08    Estimated Creatinine Clearance: 97.1 mL/min (by C-G formula based on SCr of 1.08 mg/dL).   Medical History: Past Medical History:  Diagnosis Date  . Arthritis   . COPD (chronic obstructive pulmonary disease) (HCC)   . Diabetes mellitus without complication (HCC)   . Gout   . Hypertension     Medications:  Infusions:  . heparin      Assessment: 62 yom presented to the ED with CP, found to have bilateral PE. Baseline CBC is ok and pt is not on anticoagulation PTA.   Goal of Therapy:  Heparin level 0.3-0.7 units/ml Monitor platelets by anticoagulation protocol: Yes   Plan:  Heparin bolus 5500 units IV x 1 Heparin gtt 1700 units/hr Check a 6 hr heparin level Daily heparin level and CBC F/u plans for long-term anticoagulation  Anda Sobotta, Drake LeachRachel Lynn 09/05/2017,7:04 PM

## 2017-09-05 NOTE — ED Provider Notes (Signed)
Patient placed in Quick Look pathway, seen and evaluated   Chief Complaint: chest pain  HPI:   Ernest White is a 62 y.o. male every day smoker with hx of COPD, syncope, DM, chest pain who present to the ED with chest pain. The pain started 3 days ago and has gotten worse. The pain is located in the mid chest area and goes through to the back. Patient c/o feeling short of breath for the past 2 or 3 days.   ROS: Pulmonary: chest pain, shortness of breath, cough Physical Exam:  BP 132/82 (BP Location: Right Arm)   Pulse 100   Temp 98.7 F (37.1 C) (Oral)   Resp 20   SpO2 100%    Gen: No distress  Neuro: Awake and Alert  Skin: Warm and dry  Lungs: rales right  Heart: regular rate and rhythm  Neck: No JVD   Initiation of care has begun. The patient has been counseled on the process, plan, and necessity for staying for the completion/evaluation, and the remainder of the medical screening examination    Janne Napoleoneese, Trong Gosling M, NP 09/05/17 1636    Rolland PorterJames, Mark, MD 09/06/17 0040

## 2017-09-05 NOTE — ED Provider Notes (Signed)
[facility] EMERGENCY DEPARTMENT Provider Note   CSN: [id number] Arrival date & time: 09/05/17  1616     History   Chief Complaint Chief Complaint  Patient presents with  . Chest Pain    HPI Ernest White is a 62 y.o. male with a past medical history of COPD, hypertension, diabetes, tobacco abuse, who presents to ED for evaluation of 3-day history of shortness of breath, left-sided chest pain.  States that the pain radiates to his back.  Has been constant since it began 3 days ago.  He also reports cough productive with yellow mucus but denies any hemoptysis.  He has been using his Spiriva inhaler with no improvement in his symptoms.  He was seen by his PCP earlier today and was sent to the ED to rule out ACS versus PE.  They noted that he was tachycardic and hypoxic.  Patient has been off of his home oxygen for the past 16 months because his PCP felt that he did not need it anymore.  Patient states that "I know I have pneumonia, this has happened to me 3 times before."  He reports associated nausea. Sick contacts including brother with similar symptoms including cough. He denies any fevers, abdominal pain, vomiting, recent surgeries, recent prolonged travel.  HPI  Past Medical History:  Diagnosis Date  . Arthritis   . COPD (chronic obstructive pulmonary disease) ([place])   . Diabetes mellitus without complication ([place])   . Gout   . Hypertension     Patient Active Problem List   Diagnosis Date Noted  . Chest pain 09/05/2017  . Snoring 04/23/2017  . Erectile dysfunction 08/16/2016  . Frequent No-show for appointment 07/20/2016  . Tobacco use disorder 06/14/2016  . External hemorrhoids 06/03/2016  . Syncope and collapse 12/27/2015  . Uncontrolled diabetes mellitus ([place]) 12/27/2015  . Chronic obstructive airway disease ([place]) 12/23/2015  . Primary hypertension 12/15/2015    Past Surgical History:  Procedure Laterality Date  . APPENDECTOMY    . HERNIA  REPAIR          Home Medications    Prior to Admission medications   Medication Sig Start Date End Date Taking? Authorizing Provider  ACCU-CHEK AVIVA PLUS test strip USE AS DIRECTED TO CHECK BLOOD GLUCOSE UP TO TWICE DAILY 07/19/17   Bre[place] Bing, DO  albuterol (PROVENTIL HFA;VENTOLIN HFA) 108 (90 Base) MCG/ACT inhaler Inhale 2 puffs into the lungs every 6 (six) hours as needed for wheezing or shortness of breath. 03/20/17   Nuala Alpha, DO  BAYER MICROLET LANCETS lancets Use as instructed 06/13/16   Harvey Bing, DO  Blood Glucose Monitoring Suppl (ACCU-CHEK AVIVA PLUS) w/Device KIT Use as directed to check blood glucose up to twice daily. E11.9.  May substitute for formulary preferred. 06/14/16   Zenia Resides, MD  glucose blood (ACCU-CHEK AVIVA PLUS) test strip Use as directed to check blood glucose up to twice daily. E11.9.  May substitute for formulary preferred. 03/20/17   Nuala Alpha, DO  hydrochlorothiazide (HYDRODIURIL) 25 MG tablet Take 1 tablet (25 mg total) by mouth daily. 04/03/17   Richland Bing, DO  ibuprofen (ADVIL,MOTRIN) 600 MG tablet Take 1 tablet (600 mg total) by mouth every 8 (eight) hours as needed. 03/14/17   Lind Covert, MD  lisinopril (PRINIVIL,ZESTRIL) 40 MG tablet Take 1 tablet (40 mg total) by mouth daily. 04/03/17   Wahneta Bing, DO  metFORMIN (GLUCOPHAGE) 500 MG tablet TAKE 1 TABLET (500 MG  TOTAL) BY MOUTH 2 TIMES DAILY WITH A MEAL. 06/18/17   Ashley Bing, DO  metFORMIN (GLUCOPHAGE) 500 MG tablet TAKE 1 TABLET BY MOUTH TWICE DAILY WITH A MEAL 08/16/17   [place] Bing, DO  nicotine (NICODERM CQ - DOSED IN MG/24 HOURS) 14 mg/24hr patch Place 1 patch (14 mg total) onto the skin daily. 04/23/17   [place] Bing, DO  nicotine (NICODERM CQ - DOSED IN MG/24 HOURS) 21 mg/24hr patch Place 1 patch (21 mg total) onto the skin daily. 07/26/16   Lovenia Kim, MD  sildenafil (REVATIO) 20 MG tablet Take 1 tablet (20 mg total) by mouth 3  (three) times daily. Take as needed 30 minutes to 1 hour before sexual activity. Take no more than 5 in one day total. 03/20/17   Lockamy, Timothy, DO  tiotropium (SPIRIVA) 18 MCG inhalation capsule Place 1 capsule (18 mcg total) into inhaler and inhale daily. 07/26/16   Lovenia Kim, MD    Family History No family history on file.  Social History Social History   Tobacco Use  . Smoking status: Current Every Day Smoker    Packs/day: 1.00    Years: 48.00    Pack years: 48.00    Types: Cigarettes  . Smokeless tobacco: Never Used  . Tobacco comment: Works at tobacco plant  Substance Use Topics  . Alcohol use: Yes    Alcohol/week: 0.6 oz    Types: 1 Cans of beer per week    Comment: "moderately"  . Drug use: No     Allergies   Penicillins and Aspirin   Review of Systems Review of Systems  Constitutional: Positive for appetite change. Negative for chills and fever.  HENT: Negative for ear pain, rhinorrhea, sneezing and sore throat.   Eyes: Negative for photophobia and visual disturbance.  Respiratory: Positive for cough and shortness of breath. Negative for chest tightness and wheezing.   Cardiovascular: Positive for chest pain. Negative for palpitations.  Gastrointestinal: Positive for nausea. Negative for abdominal pain, blood in stool, constipation, diarrhea and vomiting.  Genitourinary: Negative for dysuria, hematuria and urgency.  Musculoskeletal: Negative for myalgias.  Skin: Negative for rash.  Neurological: Negative for dizziness, weakness, light-headedness and headaches.     Physical Exam Updated Vital Signs BP (!) 126/58   Pulse 96   Temp 98.7 F (37.1 C) (Oral)   Resp 20   Ht 5' 11" (1.803 m)   Wt 132.2 kg (291 lb 7.2 oz)   SpO2 97%   BMI 40.65 kg/m   Physical Exam  Constitutional: He appears well-developed and well-nourished. No distress.  3L of oxygen being delivered via nasal cannula.   HENT:  Head: Normocephalic and atraumatic.  Nose: Nose  normal.  Eyes: Conjunctivae and EOM are normal. Left eye exhibits no discharge. No scleral icterus.  Neck: Normal range of motion. Neck supple.  Cardiovascular: Regular rhythm, normal heart sounds and intact distal pulses. Tachycardia present. Exam reveals no gallop and no friction rub.  No murmur heard. Pulmonary/Chest: Effort normal and breath sounds normal. No respiratory distress.  Abdominal: Soft. Bowel sounds are normal. He exhibits no distension. There is no tenderness. There is no guarding.  Musculoskeletal: Normal range of motion. He exhibits no edema.  No lower extremity edema, erythema or calf tenderness bilaterally.  Neurological: He is alert. He exhibits normal muscle tone. Coordination normal.  Skin: Skin is warm and dry. No rash noted.  Psychiatric: He has a normal mood and affect.  Nursing note and vitals  reviewed.    ED Treatments / Results  Labs (all labs ordered are listed, but only abnormal results are displayed) Labs Reviewed  BASIC METABOLIC PANEL - Abnormal; Notable for the following components:      Result Value   Chloride 97 (*)    Glucose, Bld 147 (*)    All other components within normal limits  CBC - Abnormal; Notable for the following components:   WBC 12.1 (*)    Hemoglobin 12.6 (*)    All other components within normal limits  HEPATIC FUNCTION PANEL  LIPASE, BLOOD  HEPARIN LEVEL (UNFRACTIONATED)  CBC  I-STAT TROPONIN, ED    EKG None  Radiology Dg Chest 2 View  Result Date: 09/05/2017 CLINICAL DATA:  Chest pain 3 days centrally radiating to both sides and back. EXAM: CHEST - 2 VIEW COMPARISON:  08/17/2016 FINDINGS: Lungs are adequately inflated without focal lobar consolidation or effusion. Subtle hazy prominence of the infrahilar markings suggesting minimal vascular congestion. Mild stable cardiomegaly. Flattening of the hemidiaphragms. Mild degenerate change of the spine. IMPRESSION: Suggestion mild vascular congestion. Electronically Signed    By: Marin Olp M.D.   On: 09/05/2017 17:16   Ct Angio Chest Pe W/cm &/or Wo Cm  Result Date: 09/05/2017 CLINICAL DATA:  Chest pain radiating to the back for 3 days. Hypertension. EXAM: CT ANGIOGRAPHY CHEST WITH CONTRAST TECHNIQUE: Multidetector CT imaging of the chest was performed using the standard protocol during bolus administration of intravenous contrast. Multiplanar CT image reconstructions and MIPs were obtained to evaluate the vascular anatomy. CONTRAST:  151m ISOVUE-370 IOPAMIDOL (ISOVUE-370) INJECTION 76% COMPARISON:  Chest radiograph from earlier today. FINDINGS: Cardiovascular: The study is low to moderate quality for the evaluation of pulmonary embolism, limited by motion degradation particularly in the lower lungs and limited by slightly suboptimal contrast opacification. No central, lobar or segmental pulmonary emboli. Subsegmental pulmonary emboli are noted in the medial left lower lobe (series 6/image 185) and probably in the medial right lower lobe (series 6/image 218). Great vessels are normal in course and caliber. Top-normal heart size. No significant pericardial fluid/thickening. Mediastinum/Nodes: No discrete thyroid nodules. Unremarkable esophagus. No pathologically enlarged axillary, mediastinal or hilar lymph nodes. Lungs/Pleura: No pneumothorax. No pleural effusion. Nodular 2.9 cm focus of consolidation with surrounding ground-glass opacity in the medial basilar right lower lobe (series 7/image 116). A few scattered solid pulmonary nodules in the right greater than left lungs measuring up to 6 mm in anterior right lower lobe (series 7/image 88). Upper abdomen: No acute abnormality. Musculoskeletal: No aggressive appearing focal osseous lesions. Moderate thoracic spondylosis. Review of the MIP images confirms the above findings. IMPRESSION: 1. Acute subsegmental bilateral lower lobe pulmonary emboli. 2. Nodular 2.9 cm focus of consolidation with surrounding ground-glass opacity in  the medial basilar right lower lobe, suggestive of pulmonary infarct. Suggest follow-up on chest CT in 3 months. 3. Scattered solid pulmonary nodules in both lungs, largest 6 mm. Non-contrast chest CT at 3-6 months is recommended. If the nodules are stable at time of repeat CT, then future CT at 18-24 months (from today's scan) is considered optional for low-risk patients, but is recommended for high-risk patients. This recommendation follows the consensus statement: Guidelines for Management of Incidental Pulmonary Nodules Detected on CT Images: From the Fleischner Society 2017; Radiology 2017; 284:228-243. Critical Value/emergent results were called by telephone at the time of interpretation on 09/05/2017 at 6:49 pm to DR. CAMPOS, who verbally acknowledged these results. Electronically Signed   By: JJanina MayoD.  On: 09/05/2017 18:53    Procedures Procedures (including critical care time)  CRITICAL CARE Performed by: Delia Heady   Total critical care time: 45 minutes  Critical care time was exclusive of separately billable procedures and treating other patients.  Critical care was necessary to treat or prevent imminent or life-threatening deterioration.  Critical care was time spent personally by me on the following activities: development of treatment plan with patient and/or surrogate as well as nursing, discussions with consultants, evaluation of patient's response to treatment, examination of patient, obtaining history from patient or surrogate, ordering and performing treatments and interventions, ordering and review of laboratory studies, ordering and review of radiographic studies, pulse oximetry and re-evaluation of patient's condition.   Medications Ordered in ED Medications  iopamidol (ISOVUE-370) 76 % injection (has no administration in time range)  heparin bolus via infusion 5,500 Units (has no administration in time range)  heparin ADULT infusion 100 units/mL (25000  units/274m sodium chloride 0.45%) (has no administration in time range)  iopamidol (ISOVUE-370) 76 % injection 100 mL (100 mLs Intravenous Contrast Given 09/05/17 1812)     Initial Impression / Assessment and Plan / ED Course  I have reviewed the triage vital signs and the nursing notes.  Pertinent labs & imaging results that were available during my care of the patient were reviewed by me and considered in my medical decision making (see chart for details).     62year old male with a past medical history of COPD, hypertension, diabetes presents for 3-day history of shortness of breath, left-sided chest pain radiating to his back.  He also reports cough productive with yellow mucus.  Denies any hemoptysis.  He was sent here by his PCP to rule out ACS versus PE.  They noted that he was tachycardic and hypoxic.  He stopped wearing his home oxygen 16 months ago because his PCP felt that he did not need it anymore.  Patient is concerned that he may have pneumonia.  Today's work-up reveals acute subsegmental bilateral lower lobe pulmonary emboli. Troponin negative. BMP, CBC unremarkable.  Patient will be began on heparin and admitted to hospitalist.  Portions of this note were generated with Dragon dictation software. Dictation errors may occur despite best attempts at proofreading.   Final Clinical Impressions(s) / ED Diagnoses   Final diagnoses:  None    ED Discharge Orders    None       KDelia Heady PA-C 09/05/17 1Dorann Ou MD 09/07/17 0004

## 2017-09-05 NOTE — Progress Notes (Signed)
   Subjective   Patient ID: Ernest White    DOB: 1955-07-21, 62 y.o. male   MRN: [id number]  CC: "Chest pain"  HPI: Ernest White is a 62 y.o. male who presents to clinic today for the following:  Chest pain: Acute. Onset 3 days ago suddenly with associated SOB after waking up. Has been endorsing generalized fatigue and cramps along with increased sputum production. Patient has been using his Spiriva as needed. He does not have an albuterol inhaler. Patient feels like he has pneumonia and was hospitalized for strep pneumoniae 1 year ago. He has been vaccinated since. He continues to smoke daily.  He has no history of ACS, stents or CABG. HEART score is 4-5 though unable to check troponin.  She has no history of calf swelling, prolonged sedentary state, or long travel.  Wells score for PE 4.5.  EKG obtained without ST changes or T wave abnormalities.  Discussed need for transfer to emergency department for further evaluation to rule out concerns for ACS versus PE versus other etiology.   ROS: see HPI for pertinent.  PMFSH: HTN, COPD, NIDT2DM, tobacco use disorder, ED.  Surgical history appendectomy and hernia.  Family history unremarkable.  Smoking status reviewed.  Medications reviewed.  Objective   BP 110/72   Pulse (!) 102   Temp 98.3 F (36.8 C) (Oral)   Wt 291 lb 6.4 oz (132.2 kg)   SpO2 92%   BMI 40.64 kg/m  Vitals and nursing note reviewed.  General: ill appearing male, moderate distress with non-toxic appearance HEENT: normocephalic, atraumatic, moist mucous membranes Neck: supple, non-tender without lymphadenopathy, no JVD Cardiovascular: tachycardic with regular rhythm without murmurs, rubs, or gallops Lungs: mild labored breathing, clear to auscultation bilaterally Abdomen: soft, non-tender, non-distended, normoactive bowel sounds Skin: warm, dry, no rashes or lesions, cap refill < 2 seconds Extremities: warm and well perfused, normal tone, no edema Neuro: grossly  moving all 4 extremities, no dysarthria or facial droop Psych: A&Ox4, non-tremulous  Assessment & Plan   Chest pain Acute.  Concerning for ACS versus PE.  Less likely COPD exacerbation given absent wheezing sputum on exam.  Patient does have mild tachycardia and borderline hypoxia.  EKG reassuring.  Discussed answer to ED with patient who is in agreement with plan. - [facility] - Given supplemental oxygen and aspirin  Orders Placed This Encounter  Procedures  . HgB A1c  . EKG 12-Lead   No orders of the defined types were placed in this encounter.   Durward Parcelavid Joseff Luckman, DO [facility], PGY-2 09/05/2017, 3:55 PM

## 2017-09-05 NOTE — ED Triage Notes (Signed)
Pt BIB [facility] from PCP, c/o chest pain x 3 days and shortness of breath that started last night. Chest pain radiates to back. Pt on 3L O2 on arrival, hx COPD.

## 2017-09-05 NOTE — Progress Notes (Signed)
New Admission Note:   Arrival Method: from ED Mental Orientation: Alert and oriented x 4 Telemetry:box5 Assessment: Completed Skin: Intact Pain:0/10 Tubes: None Safety Measures: Safety Fall Prevention Plan has been discussed  Admission:  [facility] [place] Orientation: Patient has been oriented to the room, unit and staff.  Family: fiance at bedside Heparin infusing without problems   Orders to be reviewed and implemented. Will continue to monitor the patient. Call light has been placed within reach and bed alarm has been activated.   Bettey MareJasmina Zaveon Gillen, RN Phone: [phone]

## 2017-09-05 NOTE — ED Notes (Signed)
ED Provider at bedside. 

## 2017-09-05 NOTE — Assessment & Plan Note (Signed)
Acute.  Concerning for ACS versus PE.  Less likely COPD exacerbation given absent wheezing sputum on exam.  Patient does have mild tachycardia and borderline hypoxia.  EKG reassuring.  Discussed answer to ED with patient who is in agreement with plan. - [facility] - Given supplemental oxygen and aspirin

## 2017-09-05 NOTE — H&P (Addendum)
[facility] Admission History and Physical Service Pager: [phone]  Patient name: Ernest White Medical record number: [id number] Date of birth: 1956-01-01 Age: 62 y.o. Gender: male  Primary Care Provider: Olowalu Bing, DO Consultants: none Code Status: full  Chief Complaint: chest pain  Assessment and Plan: Ernest White is a 62 y.o. male presenting with chest pain. PMH is significant for COPD, hypertension, diabetes, tobacco abuse.  Chest pain secondary to pulmonary emboli.  CTA chest showing acute subsegmental bilateral lower lobe pulmonary emboli with a nodular 2.9 cm focus of consolidation digestive pulmonary infarct in the medial right basilar lower lobe.  Patient has a new oxygen requirement of 3 L via nasal cannula.  Likely an unprovoked event as he has no risk factors.  However he does have a strong family history of cancer and a brother who had DVT with PE.  His CT chest also showing some scattered pulmonary nodules in both lungs.  Patient does not have unexplained weight loss or night sweats. DDX includes PNA as WBC 12.1 and cough. -Admit to inpatient, attending Dr. Andria Frames -Continue heparin gtt -start [place] this admission, will likely need for minimum of 6 months if not lifelong -Monitor on telemetry -Monitor off ABX  Pulmonary nodules in the setting of tobacco abuse.  CTA chest showing scattered solid pulmonary nodules  in both lungs, largest 6 mm -Follow-up noncontrast CT at 3 to 6 months as outpatient -Counseled patient on smoking cessation -Nicotine patch  Diabetes, uncontrolled.  Last A1c 8.1 checked today at PCP visit -Monitor CBGs -Moderate sliding scale insulin  COPD.  Not in acute exacerbation -Continue home Spiriva -PRN duo nebs  Hypertension.  Blood pressure on admission 129/78 -Monitor vitals -Continue home hydrochlorothiazide, lisinopril  Obesity -Lipid panel   FEN/GI: Heart healthy carb modified  diet Prophylaxis: Heparin gtt  Disposition: admit to inpatient  History of Present Illness:  Ernest White is a 62 y.o. male presenting with chest pain.   Patient states that he has had 3 days of chest pain with associated shortness of breath.  He was seen in clinic today and sent to the ER for this.  He states that he started having left-sided chest pain radiating to his shoulder and back, constant, progressively worsening over the last 3 days.  He also noticed that he was short of breath particularly on exertion and feels fatigued.  He states that he has been off of home oxygen for the past 1+ year after being taken off of it by his PCP.  He has been using his home Spiriva.  He states that he has a dry cough and some nausea.  No vomiting.  He does say that he has chronic hemorrhoids that intermittently bleed but not recently.  No swelling in his legs, asymmetric calf pain.  He does have cramping in both calves.  He denies any recent surgeries or long periods of immobility.  He does have a family history of blood clots, his brother had blood clots in his legs and in his lungs.  Review Of Systems: Per HPI with the following additions:   Review of Systems  Constitutional: Positive for malaise/fatigue (fatigue). Negative for chills, diaphoresis and fever.  Respiratory: Positive for cough and shortness of breath. Negative for sputum production and wheezing.   Cardiovascular: Positive for chest pain. Negative for palpitations and leg swelling.  Gastrointestinal: Positive for blood in stool (chronic intermittent). Negative for abdominal pain, heartburn, nausea and vomiting.  Genitourinary: Negative  for dysuria, frequency and urgency.    Patient Active Problem List   Diagnosis Date Noted  . Chest pain 09/05/2017  . Snoring 04/23/2017  . Erectile dysfunction 08/16/2016  . Frequent No-show for appointment 07/20/2016  . Tobacco use disorder 06/14/2016  . External hemorrhoids 06/03/2016  .  Syncope and collapse 12/27/2015  . Uncontrolled diabetes mellitus ([place]) 12/27/2015  . Chronic obstructive airway disease ([place]) 12/23/2015  . Primary hypertension 12/15/2015    Past Medical History: Past Medical History:  Diagnosis Date  . Arthritis   . COPD (chronic obstructive pulmonary disease) ([place])   . Diabetes mellitus without complication ([place])   . Gout   . Hypertension     Past Surgical History: Past Surgical History:  Procedure Laterality Date  . APPENDECTOMY    . HERNIA REPAIR      Social History: Social History   Tobacco Use  . Smoking status: Current Every Day Smoker    Packs/day: 1.00    Years: 48.00    Pack years: 48.00    Types: Cigarettes  . Smokeless tobacco: Never Used  . Tobacco comment: Works at tobacco plant  Substance Use Topics  . Alcohol use: Yes    Alcohol/week: 0.6 oz    Types: 1 Cans of beer per week    Comment: "moderately"  . Drug use: No   Additional social history: Lives at home alone. 1ppd current smoker, trying to cut down.  Has EtOH 1-2 times month Please also refer to relevant sections of EMR.  Family History: Family History  Problem Relation Age of Onset  . Deep vein thrombosis Brother   . Pulmonary embolism Brother   . Cancer - Lung Father   . Cancer Sister        breast  . Cancer Sister        throat  . Other Sister        back problems  . Heart disease Sister     Allergies and Medications: Allergies  Allergen Reactions  . Penicillins Anaphylaxis    Has patient had a PCN reaction causing immediate rash, facial/tongue/throat swelling, SOB or lightheadedness with hypotension: Yes Has patient had a PCN reaction causing severe rash involving mucus membranes or skin necrosis: No Has patient had a PCN reaction that required hospitalization pt was hospitalized at time of reaction Has patient had a PCN reaction occurring within the last 10 years: No If all of the above answers are "NO", then may proceed with Cephalosporin  use.  . Aspirin Other (See Comments)    Stomach pain   No current facility-administered medications on file prior to encounter.    Current Outpatient Medications on File Prior to Encounter  Medication Sig Dispense Refill  . ACCU-CHEK AVIVA PLUS test strip USE AS DIRECTED TO CHECK BLOOD GLUCOSE UP TO TWICE DAILY 100 each 11  . albuterol (PROVENTIL HFA;VENTOLIN HFA) 108 (90 Base) MCG/ACT inhaler Inhale 2 puffs into the lungs every 6 (six) hours as needed for wheezing or shortness of breath. 1 Inhaler 2  . BAYER MICROLET LANCETS lancets Use as instructed 100 each 12  . Blood Glucose Monitoring Suppl (ACCU-CHEK AVIVA PLUS) w/Device KIT Use as directed to check blood glucose up to twice daily. E11.9.  May substitute for formulary preferred. 1 kit 0  . glucose blood (ACCU-CHEK AVIVA PLUS) test strip Use as directed to check blood glucose up to twice daily. E11.9.  May substitute for formulary preferred. 100 each 12  . hydrochlorothiazide (HYDRODIURIL) 25 MG tablet  Take 1 tablet (25 mg total) by mouth daily. 90 tablet 3  . ibuprofen (ADVIL,MOTRIN) 600 MG tablet Take 1 tablet (600 mg total) by mouth every 8 (eight) hours as needed. 20 tablet 0  . lisinopril (PRINIVIL,ZESTRIL) 40 MG tablet Take 1 tablet (40 mg total) by mouth daily. 90 tablet 3  . metFORMIN (GLUCOPHAGE) 500 MG tablet TAKE 1 TABLET (500 MG TOTAL) BY MOUTH 2 TIMES DAILY WITH A MEAL. 60 tablet 0  . metFORMIN (GLUCOPHAGE) 500 MG tablet TAKE 1 TABLET BY MOUTH TWICE DAILY WITH A MEAL 180 tablet 3  . nicotine (NICODERM CQ - DOSED IN MG/24 HOURS) 14 mg/24hr patch Place 1 patch (14 mg total) onto the skin daily. 21 patch 8  . nicotine (NICODERM CQ - DOSED IN MG/24 HOURS) 21 mg/24hr patch Place 1 patch (21 mg total) onto the skin daily. 28 patch 0  . sildenafil (REVATIO) 20 MG tablet Take 1 tablet (20 mg total) by mouth 3 (three) times daily. Take as needed 30 minutes to 1 hour before sexual activity. Take no more than 5 in one day total. 100  tablet 3  . tiotropium (SPIRIVA) 18 MCG inhalation capsule Place 1 capsule (18 mcg total) into inhaler and inhale daily. 30 capsule 12    Objective: BP 117/72   Pulse 92   Temp 98.7 F (37.1 C) (Oral)   Resp 20   Ht _0  (1.803 m)   Wt 132.2 kg (291 lb 7.2 oz)   SpO2 96%   BMI 40.65 kg/m  Exam: General: Laying in bed comfortably, in no acute distress Eyes: Conjunctiva normal, EOMI ENTM: Moist mucous membranes, nasal cannula in place Neck: Supple, normal range of motion Cardiovascular: Regular rate and rhythm, normal S1-S2, no murmurs Respiratory: Clear to auscultation, no wheezes, normal effort on 3 L via nasal cannula Gastrointestinal: Soft, nontender, non-distended, positive bowel sounds MSK: No lower extremity edema.  No Homans sign bilaterally.  No palpable cord bilaterally.  No calf asymmetry.  No erythema bilaterally in lower extremities Derm: Warm and dry Neuro: Sleepy but easily arousable to voice.  Oriented.  Grossly normal with no focal deficits Psych: Appropriate affect  Labs and Imaging: CBC BMET  Recent Labs  Lab 09/05/17 1636  WBC 12.1*  HGB 12.6*  HCT 39.5  PLT 228   Recent Labs  Lab 09/05/17 1636  NA 138  K 3.6  CL 97*  CO2 29  BUN 15  CREATININE 1.08  GLUCOSE 147*  CALCIUM 9.6      Dg Chest 2 View  Result Date: 09/05/2017 CLINICAL DATA:  Chest pain 3 days centrally radiating to both sides and back. EXAM: CHEST - 2 VIEW COMPARISON:  08/17/2016 FINDINGS: Lungs are adequately inflated without focal lobar consolidation or effusion. Subtle hazy prominence of the infrahilar markings suggesting minimal vascular congestion. Mild stable cardiomegaly. Flattening of the hemidiaphragms. Mild degenerate change of the spine. IMPRESSION: Suggestion mild vascular congestion. Electronically Signed   By: Marin Olp M.D.   On: 09/05/2017 17:16   Ct Angio Chest Pe W/cm &/or Wo Cm  Result Date: 09/05/2017 CLINICAL DATA:  Chest pain radiating to the back for 3  days. Hypertension. EXAM: CT ANGIOGRAPHY CHEST WITH CONTRAST TECHNIQUE: Multidetector CT imaging of the chest was performed using the standard protocol during bolus administration of intravenous contrast. Multiplanar CT image reconstructions and MIPs were obtained to evaluate the vascular anatomy. CONTRAST:  181m ISOVUE-370 IOPAMIDOL (ISOVUE-370) INJECTION 76% COMPARISON:  Chest radiograph from earlier today. FINDINGS: Cardiovascular:  The study is low to moderate quality for the evaluation of pulmonary embolism, limited by motion degradation particularly in the lower lungs and limited by slightly suboptimal contrast opacification. No central, lobar or segmental pulmonary emboli. Subsegmental pulmonary emboli are noted in the medial left lower lobe (series 6/image 185) and probably in the medial right lower lobe (series 6/image 218). Great vessels are normal in course and caliber. Top-normal heart size. No significant pericardial fluid/thickening. Mediastinum/Nodes: No discrete thyroid nodules. Unremarkable esophagus. No pathologically enlarged axillary, mediastinal or hilar lymph nodes. Lungs/Pleura: No pneumothorax. No pleural effusion. Nodular 2.9 cm focus of consolidation with surrounding ground-glass opacity in the medial basilar right lower lobe (series 7/image 116). A few scattered solid pulmonary nodules in the right greater than left lungs measuring up to 6 mm in anterior right lower lobe (series 7/image 88). Upper abdomen: No acute abnormality. Musculoskeletal: No aggressive appearing focal osseous lesions. Moderate thoracic spondylosis. Review of the MIP images confirms the above findings. IMPRESSION: 1. Acute subsegmental bilateral lower lobe pulmonary emboli. 2. Nodular 2.9 cm focus of consolidation with surrounding ground-glass opacity in the medial basilar right lower lobe, suggestive of pulmonary infarct. Suggest follow-up on chest CT in 3 months. 3. Scattered solid pulmonary nodules in both lungs,  largest 6 mm. Non-contrast chest CT at 3-6 months is recommended. If the nodules are stable at time of repeat CT, then future CT at 18-24 months (from today's scan) is considered optional for low-risk patients, but is recommended for high-risk patients. This recommendation follows the consensus statement: Guidelines for Management of Incidental Pulmonary Nodules Detected on CT Images: From the Fleischner Society 2017; Radiology 2017; 284:228-243. Critical Value/emergent results were called by telephone at the time of interpretation on 09/05/2017 at 6:49 pm to DR. CAMPOS, who verbally acknowledged these results. Electronically Signed   By: Ilona Sorrel M.D.   On: 09/05/2017 18:53    Bufford Lope, DO 09/05/2017, 7:18 PM PGY-2, [place] Intern pager: [phone], text pages welcome

## 2017-09-05 NOTE — Patient Instructions (Signed)
Thank you for coming in to see us today. Please see below to review our plan for today's visit.  We have transfer you to the ED for further workup of your chest pain.  Please call the clinic at [phone] if your symptoms worsen or you have any concerns. It was our pleasure to serve you.  Durward Parcelavid McMullen, DO [facility], PGY-2

## 2017-09-05 NOTE — ED Notes (Signed)
Patient transported to X-ray 

## 2017-09-06 LAB — BASIC METABOLIC PANEL
Anion gap: 8 (ref 5–15)
BUN: 13 mg/dL (ref 8–23)
CHLORIDE: 98 mmol/L (ref 98–111)
CO2: 30 mmol/L (ref 22–32)
Calcium: 9 mg/dL (ref 8.9–10.3)
Creatinine, Ser: 1.12 mg/dL (ref 0.61–1.24)
GFR calc Af Amer: >60 mL/min (ref >60)
Glucose, Bld: 135 mg/dL — ABNORMAL HIGH (ref 70–99)
POTASSIUM: 3.4 mmol/L — AB (ref 3.5–5.1)
SODIUM: 136 mmol/L (ref 135–145)

## 2017-09-06 LAB — GLUCOSE, CAPILLARY
GLUCOSE-CAPILLARY: 162 mg/dL — AB (ref 70–99)
GLUCOSE-CAPILLARY: 183 mg/dL — AB (ref 70–99)
Glucose-Capillary: 280 mg/dL — ABNORMAL HIGH (ref 70–99)
Glucose-Capillary: 208 mg/dL — ABNORMAL HIGH (ref 70–99)
Glucose-Capillary: 132 mg/dL — ABNORMAL HIGH (ref 70–99)

## 2017-09-06 LAB — CBC
HEMATOCRIT: 37.9 % — AB (ref 39.0–52.0)
HEMOGLOBIN: 12.1 g/dL — AB (ref 13.0–17.0)
MCH: 28.7 pg (ref 26.0–34.0)
MCHC: 31.9 g/dL (ref 30.0–36.0)
MCV: 89.8 fL (ref 78.0–100.0)
Platelets: 212 10*3/uL (ref 150–400)
RBC: 4.22 MIL/uL (ref 4.22–5.81)
RDW: 15.7 % — ABNORMAL HIGH (ref 11.5–15.5)
WBC: 15.9 10*3/uL — ABNORMAL HIGH (ref 4.0–10.5)

## 2017-09-06 LAB — HEPARIN LEVEL (UNFRACTIONATED)
Heparin Unfractionated: 0.43 IU/mL (ref 0.30–0.70)
Heparin Unfractionated: 0.38 IU/mL (ref 0.30–0.70)

## 2017-09-06 LAB — LIPID PANEL
CHOL/HDL RATIO: 3.3 ratio
CHOLESTEROL: 147 mg/dL (ref 0–200)
HDL: 45 mg/dL (ref >40)
LDL Cholesterol: 85 mg/dL (ref 0–99)
TRIGLYCERIDES: 84 mg/dL (ref <150)
VLDL: 17 mg/dL (ref 0–40)

## 2017-09-06 LAB — HIV ANTIBODY (ROUTINE TESTING W REFLEX): HIV Screen 4th Generation wRfx: NONREACTIVE

## 2017-09-06 MED ORDER — ONDANSETRON HCL 4 MG PO TABS
4.0000 mg | ORAL_TABLET | Freq: Once | ORAL | Status: AC
  Administered 2017-09-06: 4 mg via ORAL
  Filled 2017-09-06: qty 1

## 2017-09-06 MED ORDER — ONDANSETRON HCL 4 MG PO TABS
4.0000 mg | ORAL_TABLET | Freq: Three times a day (TID) | ORAL | Status: DC | PRN
Start: 2017-09-06 — End: 2017-09-07

## 2017-09-06 MED ORDER — RIVAROXABAN 15 MG PO TABS
15.0000 mg | ORAL_TABLET | Freq: Two times a day (BID) | ORAL | Status: DC
  Administered 2017-09-06 – 2017-09-07 (×2): 15 mg via ORAL
  Filled 2017-09-06 (×2): qty 1

## 2017-09-06 MED ORDER — LIDOCAINE 5 % EX PTCH
1.0000 | MEDICATED_PATCH | CUTANEOUS | Status: DC
  Administered 2017-09-06 – 2017-09-07 (×2): 1 via TRANSDERMAL
  Filled 2017-09-06 (×2): qty 1

## 2017-09-06 NOTE — Progress Notes (Signed)
PT Cancellation Note  Patient Details Name: Ernest White MRN: [id number] DOB: 04-10-1955   Cancelled Treatment:    Reason Eval/Treat Not Completed: Medical issues which prohibited therapy(heparin started at 1920 on 6/27 and not yet 24hrs. )   [name] [name] 09/06/2017, 11:49 AM  Delaney MeigsMaija Tabor Linus Weckerly, PT [phone]

## 2017-09-06 NOTE — Progress Notes (Signed)
ANTICOAGULATION CONSULT NOTE   Pharmacy Consult for heparin Indication: pulmonary embolus  Allergies  Allergen Reactions  . Penicillins Anaphylaxis    Has patient had a PCN reaction causing immediate rash, facial/tongue/throat swelling, SOB or lightheadedness with hypotension: Yes Has patient had a PCN reaction causing severe rash involving mucus membranes or skin necrosis: No Has patient had a PCN reaction that required hospitalization pt was hospitalized at time of reaction Has patient had a PCN reaction occurring within the last 10 years: No If all of the above answers are "NO", then may proceed with Cephalosporin use.  . Aspirin Other (See Comments)    Stomach pain    Patient Measurements: Height: 5\' 11"  (180.3 cm) Weight: 291 lb 7.2 oz (132.2 kg) IBW/kg (Calculated) : 75.3 Heparin Dosing Weight: 105kg  Vital Signs: Temp: 99.1 F (37.3 C) (06/28 0046) Temp Source: Oral (06/28 0046) BP: 104/61 (06/28 0046) Pulse Rate: 100 (06/28 0046)  Labs: Recent Labs    09/05/17 1636 09/06/17 0356  HGB 12.6* 12.1*  HCT 39.5 37.9*  PLT 228 212  HEPARINUNFRC  --  0.43  CREATININE 1.08 1.12    Estimated Creatinine Clearance: 93.7 mL/min (by C-G formula based on SCr of 1.12 mg/dL).   Medical History: Past Medical History:  Diagnosis Date  . Arthritis   . COPD (chronic obstructive pulmonary disease) ([facility])   . Diabetes mellitus without complication ([facility])   . Gout   . Hypertension     Medications:  Infusions:  . heparin 1,700 Units/hr (09/06/17 0500)    Assessment: 62 yom presented to the ED with CP, found to have bilateral PE. Baseline CBC is ok and pt is not on anticoagulation PTA.  Initial heparin level therapeutic.  Goal of Therapy:  Heparin level 0.3-0.7 units/ml Monitor platelets by anticoagulation protocol: Yes   Plan:  Heparin gtt 1700 units/hr Check a 6 hr heparin level to confirm Daily heparin level and CBC F/u plans for long-term  anticoagulation  Buel Molder Poteet 09/06/2017,5:17 AM

## 2017-09-06 NOTE — Progress Notes (Signed)
Patient c/o back pain ("not chronic).  Tylenol 650 mg given this am.  MD aware of pain and Lidocaine patch ordered and applied and kpad ordered and requested.  Patient updated on plan of care.

## 2017-09-06 NOTE — Progress Notes (Signed)
ANTICOAGULATION CONSULT NOTE   Pharmacy Consult for heparin Indication: pulmonary embolus  Allergies  Allergen Reactions  . Penicillins Anaphylaxis    Has patient had a PCN reaction causing immediate rash, facial/tongue/throat swelling, SOB or lightheadedness with hypotension: Yes Has patient had a PCN reaction causing severe rash involving mucus membranes or skin necrosis: No Has patient had a PCN reaction that required hospitalization pt was hospitalized at time of reaction Has patient had a PCN reaction occurring within the last 10 years: No If all of the above answers are "NO", then may proceed with Cephalosporin use.  . Aspirin Other (See Comments)    Stomach pain    Patient Measurements: Height: 5\' 11"  (180.3 cm) Weight: 284 lb 4.8 oz (129 kg)(scale a) IBW/kg (Calculated) : 75.3 Heparin Dosing Weight: 105kg  Vital Signs: Temp: 99 F (37.2 C) (06/28 0918) Temp Source: Oral (06/28 0918) BP: 110/60 (06/28 0918) Pulse Rate: 88 (06/28 0918)  Labs: Recent Labs    09/05/17 1636 09/06/17 0356 09/06/17 0938  HGB 12.6* 12.1*  --   HCT 39.5 37.9*  --   PLT 228 212  --   HEPARINUNFRC  --  0.43 0.38  CREATININE 1.08 1.12  --     Estimated Creatinine Clearance: 92.4 mL/min (by C-G formula based on SCr of 1.12 mg/dL).   Medical History: Past Medical History:  Diagnosis Date  . Arthritis   . COPD (chronic obstructive pulmonary disease) ([facility])   . Diabetes mellitus without complication ([facility])   . Gout   . Hypertension     Medications:  Infusions:  . heparin 1,700 Units/hr (09/06/17 1000)    Assessment: 62 yom presented to the ED with CP, found to have bilateral PE. Baseline CBC is ok and pt is not on anticoagulation PTA.  Second heparin level therapeutic.  Goal of Therapy:  Heparin level 0.3-0.7 units/ml Monitor platelets by anticoagulation protocol: Yes   Plan:  Continue Heparin gtt at 1700 units/hr Will recheck heparin level daily with am labs and CBC F/u  plans for long-term anticoagulation  Tera Materatherine A Alisha Bacus, PharmD, FCCM 09/06/2017,10:37 AM

## 2017-09-06 NOTE — Progress Notes (Signed)
Patient stated he voided x 3 since 7am today and went in toilet.  Urinal placed in bathroom and patient asked to save further voids in urinal for measurements.

## 2017-09-06 NOTE — Progress Notes (Signed)
Pt sleeping. Offered Kpad. Patient refused. Stated he wasn't having back pain and did not need Kpad applied at this time.

## 2017-09-06 NOTE — Progress Notes (Signed)
Family Medicine Teaching Service Daily Progress Note Intern Pager: [phone]  Patient name: Ernest White Medical record number: [id number] Date of birth: [birth year] Age: 62 y.o. Gender: male  Primary Care Provider: Wendee Beavers, DO Consultants: None Code Status: Full  Pt Overview and Major Events to Date:  Ernest White is a 62 y.o. male presenting with chest pain. PMH is significant for COPD, hypertension, diabetes, tobacco abuse.  Assessment and Plan:  Chest pain secondary to pulmonary emboli.  CTA chest showing acute subsegmental bilateral lower lobe pulmonary emboli with a nodular 2.9 cm focus of consolidation digestive pulmonary infarct in the medial right basilar lower lobe. Patient has a new oxygen requirement of 3 L via nasal cannula. Likely an unprovoked event as he has no risk factors and will need life long antiocoagulation. His CT chest also showing some scattered pulmonary nodules in both lungs.  Patient does not have unexplained weight loss or night sweats. DDX includes PNA as WBC 12.1 and cough. -Amb pulse ox, continue to wean oxygen -Discontinue heparin gtt; start Xarelto 15mg  BID for 3 weeks end date of 09/27/2017 and then start 20mg  once daily for 6 months and most likely continue lifelong. -Monitor on telemetry -Agree with no Abx and cont to monitor -PT/OT recs   Pulmonary nodules in the setting of tobacco abuse.  CTA chest showing scattered solid pulmonary nodules  in both lungs, largest 6 mm -Follow-up noncontrast CT at 3 to 6 months as outpatient -Counseled patient on smoking cessation -Nicotine patch; follow up outpatient  Diabetes, uncontrolled.  Last A1c 8.1 checked today at PCP visit -Monitor CBGs -Moderate sliding scale insulin  COPD.  Not in acute exacerbation -Continue home Spiriva -PRN duo nebs  Hypertension.  Blood pressure on admission 129/78 -Monitor vitals -Continue home hydrochlorothiazide, lisinopril  Obesity -Lipid  panel  FEN/GI: Heart healthy carb modified diet PPx: Heparin gtt  Disposition: inpatient, likely dispo 6/29  Subjective:  Patient is on 3L of O2 which is new and has chest pain radiating to the shoulders bilaterally on deep inspiration. No SOB or difficulty breathing at rest in bed. Has some subjective SOB on ambulation. No other concerns today. Provided tobacco cessation counseling.  Objective: Temp:  [97.7 F (36.5 C)-99.1 F (37.3 C)] 98.9 F (37.2 C) (06/28 0532) Pulse Rate:  [83-107] 91 (06/28 0532) Resp:  [18-21] 20 (06/28 0532) BP: (92-138)/(51-89) 114/66 (06/28 0532) SpO2:  [92 %-100 %] 92 % (06/28 0600) Weight:  [284 lb 4.8 oz (129 kg)-291 lb 7.2 oz (132.2 kg)] 284 lb 4.8 oz (129 kg) (06/28 0532) Physical Exam: Gen: Alert and Oriented x 3, NAD HEENT: Normocephalic, atraumatic, PERRLA, EOMI CV: RRR, no murmurs, normal S1, S2 split, +2 pulses dorsalis pedis bilaterally Resp: CTAB, no wheezing, rales, RLL mild rhonchi, comfortable work of breathing Abd: non-distended, non-tender, soft, +bs in all four quadrants MSK: Moves all four extremities Ext: no clubbing, cyanosis, or edema Skin: warm, dry, intact, no rashes  Laboratory: Recent Labs  Lab 09/05/17 1636 09/06/17 0356  WBC 12.1* 15.9*  HGB 12.6* 12.1*  HCT 39.5 37.9*  PLT 228 212   Recent Labs  Lab 09/05/17 1636 09/05/17 1702 09/06/17 0356  NA 138  --  136  K 3.6  --  3.4*  CL 97*  --  98  CO2 29  --  30  BUN 15  --  13  CREATININE 1.08  --  1.12  CALCIUM 9.6  --  9.0  PROT  --  6.6  --  BILITOT  --  0.7  --   ALKPHOS  --  55  --   ALT  --  27  --   AST  --  26  --   GLUCOSE 147*  --  135*   Lipid: Tot. Chol. 147, HDL 45, LDL 85  Imaging/Diagnostic Tests:  CXR IMPRESSION: Suggestion mild vascular congestion.  CTA Chest IMPRESSION: 1. Acute subsegmental bilateral lower lobe pulmonary emboli. 2. Nodular 2.9 cm focus of consolidation with surrounding ground-glass opacity in the medial  basilar right lower lobe, suggestive of pulmonary infarct. Suggest follow-up on chest CT in 3 months. 3. Scattered solid pulmonary nodules in both lungs, largest 6 mm. Non-contrast chest CT at 3-6 months is recommended. If the nodules are stable at time of repeat CT, then future CT at 18-24 months (from today's scan) is considered optional for low-risk patients, but is recommended for high-risk patients.  Arlyce HarmanLockamy, Freddi Forster, DO 09/06/2017, 8:59 AM PGY-1, [facility] FPTS Intern pager: [phone], text pages welcome

## 2017-09-06 NOTE — Care Management Note (Signed)
Case Management Note  Patient Details  Name: Neomia GlassDwain L Molony MRN: [id number] Date of Birth: [birth year]  Subjective/Objective:    Pulmonary Embolism               Action/Plan: Primary Care Provider: Wendee BeaversMcMullen, David J, DO; has private insurance with Medicaid with prescription drug coverage; CM following for progression of care.  Expected Discharge Date:    possibly 09/07/2017              Expected Discharge Plan:   Home  Status of Service:   In progress  Reola MosherChandler, Patrina Andreas L, RN,MHA,BSN [phone] 09/06/2017, 11:21 AM

## 2017-09-06 NOTE — Progress Notes (Signed)
SATURATION QUALIFICATIONS: (This note is used to comply with regulatory documentation for home oxygen)  Patient Saturations on Room Air at Rest = 92%  Patient Saturations on Room Air while Ambulating = 90%  Patient Saturations on 0 Liters of oxygen while Ambulating = 90%  Please briefly explain why patient needs home oxygen: Patient does NOT NEED HOME OXYGEN

## 2017-09-07 LAB — GLUCOSE, CAPILLARY
Glucose-Capillary: 136 mg/dL — ABNORMAL HIGH (ref 70–99)
Glucose-Capillary: 221 mg/dL — ABNORMAL HIGH (ref 70–99)

## 2017-09-07 LAB — CBC
HCT: 36.8 % — ABNORMAL LOW (ref 39.0–52.0)
Hemoglobin: 11.7 g/dL — ABNORMAL LOW (ref 13.0–17.0)
MCH: 28.7 pg (ref 26.0–34.0)
MCHC: 31.8 g/dL (ref 30.0–36.0)
MCV: 90.2 fL (ref 78.0–100.0)
PLATELETS: 196 10*3/uL (ref 150–400)
RBC: 4.08 MIL/uL — ABNORMAL LOW (ref 4.22–5.81)
RDW: 15.5 % (ref 11.5–15.5)
WBC: 12.6 10*3/uL — AB (ref 4.0–10.5)

## 2017-09-07 MED ORDER — RIVAROXABAN 15 MG PO TABS: 15.0000 mg | ORAL_TABLET | Freq: Two times a day (BID) | ORAL | 0 refills | Status: DC

## 2017-09-07 NOTE — Progress Notes (Signed)
Patient discharge to home, wife at bedside. D/c instructions and follow up appointmnents discussed with patient, verbalized understanding.

## 2017-09-07 NOTE — Discharge Instructions (Signed)
You were admitted for having a clot in your lungs.  We will continue the Xarelto (rivaroxaban) for at least 3-6 months.  Take 15 mg of this medication twice daily for the first 21 days followed by 20 mg daily for the remainder of the treatment.  He will follow-up with our clinic on Tuesday, 09/10/2017 at 9 AM.

## 2017-09-08 NOTE — Discharge Summary (Signed)
//[facility] Discharge Summary  Patient name: Ernest White Medical record number: [id number] Date of birth: 01/23/1956 Age: 62 y.o. Gender: male Date of Admission: 09/05/2017  Date of Discharge: 09/07/2017 Admitting Physician: Zenia Resides, MD  Primary Care Provider: Altheimer Bing, DO Consultants: None  Indication for Hospitalization:   Bilateral Pulmonary Emboli  Discharge Diagnoses/Problem List:   Bilateral Pulmonary Emboli COPD T2DM HTN Obesity  Disposition: home  Discharge Condition: medically stable  Discharge Exam:   Gen: Alert and Oriented x 3, NAD HEENT: Normocephalic, atraumatic, PERRLA, EOMI CV: RRR, no murmurs, normal S1, S2 split, +2 pulses dorsalis pedis bilaterally Resp: CTAB, no wheezing, rales, RLL mild rhonchi, comfortable work of breathing Abd: non-distended, non-tender, soft, +bs in all four quadrants MSK: Moves all four extremities Ext: no clubbing, cyanosis, or edema Skin: warm, dry, intact, no rashes  Brief Hospital Course:  Ernest White is a 62y/o male with a PMH of COPD, hypertension, diabetes, tobacco abuse. He presented to the [facility] with acute SOB that started 3 days before his appointment. He was sent to the ED with concern for PE and had a chest CTA performed that found bilateral pulmonary emboli. He was started on a Heparin drip and admitted to the hospital. He also had documented desaturations and required 3L of oxygen, which was a new oxygen requirement. Patient was started on Xarelto 53m BID for 3 weeks and then will start 272mdaily for at least 6 months. Considering this PE is unprovoked he may benefit from life long anticoagulation.  On 6/29 he was evaluated, was able to ambulate on room air with no desaturations and was medically stable for discharge home.  Issues for Follow Up:  1. Patient was started on Xarelto 157mID for 3 weeks. Please ensure he continues this and then  converts to 65m57mily on 7/ 18/2019 for 6 months and possibly life long. 2. Ensure patient is having oxygen saturation >92% on room air while ambulating at clinic visit.  Significant Procedures:   None  Significant Labs and Imaging:  Recent Labs  Lab 09/05/17 1636 09/06/17 0356 09/07/17 0446  WBC 12.1* 15.9* 12.6*  HGB 12.6* 12.1* 11.7*  HCT 39.5 37.9* 36.8*  PLT 228 212 196   Recent Labs  Lab 09/05/17 1636 09/05/17 1702 09/06/17 0356  NA 138  --  136  K 3.6  --  3.4*  CL 97*  --  98  CO2 29  --  30  GLUCOSE 147*  --  135*  BUN 15  --  13  CREATININE 1.08  --  1.12  CALCIUM 9.6  --  9.0  ALKPHOS  --  55  --   AST  --  26  --   ALT  --  27  --   ALBUMIN  --  3.6  --    Lipid: Tot. Chol. 147, HDL 45, LDL 85  CXR IMPRESSION: Suggestion mild vascular congestion.  CTA Chest IMPRESSION: 1. Acute subsegmental bilateral lower lobe pulmonary emboli. 2. Nodular 2.9 cm focus of consolidation with surrounding ground-glass opacity in the medial basilar right lower lobe, suggestive of pulmonary infarct. Suggest follow-up on chest CT in 3 months. 3. Scattered solid pulmonary nodules in both lungs, largest 6 mm. Non-contrast chest CT at 3-6 months is recommended. If the nodules are stable at time of repeat CT, then future CT at 18-24 months (from today's scan) is considered optional for low-risk patients, but is recommended for  high-risk patients.  Results/Tests Pending at Time of Discharge: None  Discharge Medications:  Allergies as of 09/07/2017      Reactions   Penicillins Anaphylaxis   Has patient had a PCN reaction causing immediate rash, facial/tongue/throat swelling, SOB or lightheadedness with hypotension: Yes Has patient had a PCN reaction causing severe rash involving mucus membranes or skin necrosis: No Has patient had a PCN reaction that required hospitalization pt was hospitalized at time of reaction Has patient had a PCN reaction occurring within the  last 10 years: No If all of the above answers are "NO", then may proceed with Cephalosporin use.   Aspirin Other (See Comments)   Stomach pain      Medication List    TAKE these medications   ACCU-CHEK AVIVA PLUS w/Device Kit Use as directed to check blood glucose up to twice daily. E11.9.  May substitute for formulary preferred.   acetaminophen 500 MG tablet Commonly known as:  TYLENOL Take 1,000 mg by mouth every 6 (six) hours as needed for mild pain.   albuterol 108 (90 Base) MCG/ACT inhaler Commonly known as:  PROVENTIL HFA;VENTOLIN HFA Inhale 2 puffs into the lungs every 6 (six) hours as needed for wheezing or shortness of breath.   BAYER MICROLET LANCETS lancets Use as instructed   budesonide-formoterol 160-4.5 MCG/ACT inhaler Commonly known as:  SYMBICORT Inhale 2 puffs into the lungs daily.   glucose blood test strip Commonly known as:  ACCU-CHEK AVIVA PLUS Use as directed to check blood glucose up to twice daily. E11.9.  May substitute for formulary preferred.   ACCU-CHEK AVIVA PLUS test strip Generic drug:  glucose blood USE AS DIRECTED TO CHECK BLOOD GLUCOSE UP TO TWICE DAILY   hydrochlorothiazide 25 MG tablet Commonly known as:  HYDRODIURIL Take 1 tablet (25 mg total) by mouth daily.   ibuprofen 200 MG tablet Commonly known as:  ADVIL,MOTRIN Take 400 mg by mouth every 6 (six) hours as needed for moderate pain. What changed:  Another medication with the same name was removed. Continue taking this medication, and follow the directions you see here.   lisinopril 40 MG tablet Commonly known as:  PRINIVIL,ZESTRIL Take 1 tablet (40 mg total) by mouth daily.   metFORMIN 500 MG tablet Commonly known as:  GLUCOPHAGE TAKE 1 TABLET (500 MG TOTAL) BY MOUTH 2 TIMES DAILY WITH A MEAL.   metFORMIN 500 MG tablet Commonly known as:  GLUCOPHAGE TAKE 1 TABLET BY MOUTH TWICE DAILY WITH A MEAL   nicotine 21 mg/24hr patch Commonly known as:  NICODERM CQ - dosed in mg/24  hours Place 1 patch (21 mg total) onto the skin daily.   nicotine 14 mg/24hr patch Commonly known as:  NICODERM CQ - dosed in mg/24 hours Place 1 patch (14 mg total) onto the skin daily.   Rivaroxaban 15 MG Tabs tablet Commonly known as:  XARELTO Take 1 tablet (15 mg total) by mouth 2 (two) times daily with a meal.   sildenafil 20 MG tablet Commonly known as:  REVATIO Take 1 tablet (20 mg total) by mouth 3 (three) times daily. Take as needed 30 minutes to 1 hour before sexual activity. Take no more than 5 in one day total.   tiotropium 18 MCG inhalation capsule Commonly known as:  SPIRIVA Place 1 capsule (18 mcg total) into inhaler and inhale daily.   vitamin B-12 1000 MCG tablet Commonly known as:  CYANOCOBALAMIN Take 1,000 mcg by mouth daily.       Discharge  Instructions: Please refer to Patient Instructions section of EMR for full details.  Patient was counseled important signs and symptoms that should prompt return to medical care, changes in medications, dietary instructions, activity restrictions, and follow up appointments.   Follow-Up Appointments: Follow-up Information    [facility]. Go on 09/10/2017.   Why:  Go to appointment at 9:00 am. Arrive 15 mins early with your medications. Contact information: [address], [place], DO 09/08/2017, 3:05 PM PGY-1, [place]

## 2017-09-10 ENCOUNTER — Other Ambulatory Visit: Payer: Self-pay

## 2017-09-10 ENCOUNTER — Encounter: Payer: Self-pay | Admitting: Student in an Organized Health Care Education/Training Program

## 2017-09-10 ENCOUNTER — Ambulatory Visit (INDEPENDENT_AMBULATORY_CARE_PROVIDER_SITE_OTHER): Payer: Medicaid Other | Admitting: Student in an Organized Health Care Education/Training Program

## 2017-09-10 ENCOUNTER — Telehealth: Payer: Self-pay | Admitting: *Deleted

## 2017-09-10 VITALS — BP 122/74 | HR 96 | Temp 98.5°F | Ht 71.0 in | Wt 290.0 lb

## 2017-09-10 DIAGNOSIS — I2699 Other pulmonary embolism without acute cor pulmonale: Secondary | ICD-10-CM | POA: Diagnosis not present

## 2017-09-10 DIAGNOSIS — R918 Other nonspecific abnormal finding of lung field: Secondary | ICD-10-CM | POA: Diagnosis not present

## 2017-09-10 DIAGNOSIS — E139 Other specified diabetes mellitus without complications: Secondary | ICD-10-CM | POA: Insufficient documentation

## 2017-09-10 DIAGNOSIS — F17200 Nicotine dependence, unspecified, uncomplicated: Secondary | ICD-10-CM

## 2017-09-10 MED ORDER — RIVAROXABAN 20 MG PO TABS: 20.0000 mg | ORAL_TABLET | Freq: Every day | ORAL | 1 refills | Status: DC

## 2017-09-10 NOTE — Progress Notes (Signed)
   Subjective:    Patient ID: Ernest White, male    DOB: [birth year], 62 y.o.   MRN: [id number]  HPI    Review of Systems     Objective:   Physical Exam        Assessment & Plan:

## 2017-09-10 NOTE — Patient Instructions (Addendum)
Ernest White,  As we discussed in our visit, I wanted to give you some information to look over on a few subjects.  1. Your dose of Xarelto will change in July 18 to 20mg  once daily and your prescription should be at the pharmacy for you.  2. I will be putting in a referral to go get a repeat CT scan of your chest in 3 months from your hospital visit.  3. Including information for a DASH diet to help improve your weight loss and overall energy levels.  4. Continue with your effort to stop smoking for good. I'm very proud of the progress you've made so far. Keep it up!!  5. Also, you are due for a colonoscopy, eye exam, and diabetic foot exam. Please let your PCP know when you're ready to schedule those.   Thanks for letting me be a part of your treatment! Jamelle Rushing DO   DASH Eating Plan DASH stands for "Dietary Approaches to Stop Hypertension." The DASH eating plan is a healthy eating plan that has been shown to reduce high blood pressure (hypertension). It may also reduce your risk for type 2 diabetes, heart disease, and stroke. The DASH eating plan may also help with weight loss. What are tips for following this plan? General guidelines  Avoid eating more than 2,300 mg (milligrams) of salt (sodium) a day. If you have hypertension, you may need to reduce your sodium intake to 1,500 mg a day.  Limit alcohol intake to no more than 1 drink a day for nonpregnant women and 2 drinks a day for men. One drink equals 12 oz of beer, 5 oz of wine, or 1 oz of hard liquor.  Work with your health care provider to maintain a healthy body weight or to lose weight. Ask what an ideal weight is for you.  Get at least 30 minutes of exercise that causes your heart to beat faster (aerobic exercise) most days of the week. Activities may include walking, swimming, or biking.  Work with your health care provider or diet and nutrition specialist (dietitian) to adjust your eating plan to your individual  calorie needs. Reading food labels  Check food labels for the amount of sodium per serving. Choose foods with less than 5 percent of the Daily Value of sodium. Generally, foods with less than 300 mg of sodium per serving fit into this eating plan.  To find whole grains, look for the word "whole" as the first word in the ingredient list. Shopping  Buy products labeled as "low-sodium" or "no salt added."  Buy fresh foods. Avoid canned foods and premade or frozen meals. Cooking  Avoid adding salt when cooking. Use salt-free seasonings or herbs instead of table salt or sea salt. Check with your health care provider or pharmacist before using salt substitutes.  Do not fry foods. Cook foods using healthy methods such as baking, boiling, grilling, and broiling instead.  Cook with heart-healthy oils, such as olive, canola, soybean, or sunflower oil. Meal planning   Eat a balanced diet that includes: ? 5 or more servings of fruits and vegetables each day. At each meal, try to fill half of your plate with fruits and vegetables. ? Up to 6-8 servings of whole grains each day. ? Less than 6 oz of lean meat, poultry, or fish each day. A 3-oz serving of meat is about the same size as a deck of cards. One egg equals 1 oz. ? 2 servings of low-fat dairy each  day. ? A serving of nuts, seeds, or beans 5 times each week. ? Heart-healthy fats. Healthy fats called Omega-3 fatty acids are found in foods such as flaxseeds and coldwater fish, like sardines, salmon, and mackerel.  Limit how much you eat of the following: ? Canned or prepackaged foods. ? Food that is high in trans fat, such as fried foods. ? Food that is high in saturated fat, such as fatty meat. ? Sweets, desserts, sugary drinks, and other foods with added sugar. ? Full-fat dairy products.  Do not salt foods before eating.  Try to eat at least 2 vegetarian meals each week.  Eat more home-cooked food and less restaurant, buffet, and fast  food.  When eating at a restaurant, ask that your food be prepared with less salt or no salt, if possible. What foods are recommended? The items listed may not be a complete list. Talk with your dietitian about what dietary choices are best for you. Grains Whole-grain or whole-wheat bread. Whole-grain or whole-wheat pasta. Brown rice. Orpah Cobbatmeal. Quinoa. Bulgur. Whole-grain and low-sodium cereals. Pita bread. Low-fat, low-sodium crackers. Whole-wheat flour tortillas. Vegetables Fresh or frozen vegetables (raw, steamed, roasted, or grilled). Low-sodium or reduced-sodium tomato and vegetable juice. Low-sodium or reduced-sodium tomato sauce and tomato paste. Low-sodium or reduced-sodium canned vegetables. Fruits All fresh, dried, or frozen fruit. Canned fruit in natural juice (without added sugar). Meat and other protein foods Skinless chicken or Malawiturkey. Ground chicken or Mal[place]urkey. Pork with fat trimmed off. Fish and seafood. Egg whites. Dried beans, peas, or lentils. Unsalted nuts, nut butters, and seeds. Unsalted canned beans. Lean cuts of beef with fat trimmed off. Low-sodium, lean deli meat. Dairy Low-fat (1%) or fat-free (skim) milk. Fat-free, low-fat, or reduced-fat cheeses. Nonfat, low-sodium ricotta or cottage cheese. Low-fat or nonfat yogurt. Low-fat, low-sodium cheese. Fats and oils Soft margarine without trans fats. Vegetable oil. Low-fat, reduced-fat, or light mayonnaise and salad dressings (reduced-sodium). Canola, safflower, olive, soybean, and sunflower oils. Avocado. Seasoning and other foods Herbs. Spices. Seasoning mixes without salt. Unsalted popcorn and pretzels. Fat-free sweets. What foods are not recommended? The items listed may not be a complete list. Talk with your dietitian about what dietary choices are best for you. Grains Baked goods made with fat, such as croissants, muffins, or some breads. Dry pasta or rice meal packs. Vegetables Creamed or fried vegetables. Vegetables  in a cheese sauce. Regular canned vegetables (not low-sodium or reduced-sodium). Regular canned tomato sauce and paste (not low-sodium or reduced-sodium). Regular tomato and vegetable juice (not low-sodium or reduced-sodium). Rosita FirePickles. Olives. Fruits Canned fruit in a light or heavy syrup. Fried fruit. Fruit in cream or butter sauce. Meat and other protein foods Fatty cuts of meat. Ribs. Fried meat. Tomasa BlaseBacon. Sausage. Bologna and other processed lunch meats. Salami. Fatback. Hotdogs. Bratwurst. Salted nuts and seeds. Canned beans with added salt. Canned or smoked fish. Whole eggs or egg yolks. Chicken or Malawiturkey with skin. Dairy Whole or 2% milk, cream, and half-and-half. Whole or full-fat cream cheese. Whole-fat or sweetened yogurt. Full-fat cheese. Nondairy creamers. Whipped toppings. Processed cheese and cheese spreads. Fats and oils Butter. Stick margarine. Lard. Shortening. Ghee. Bacon fat. Tropical oils, such as coconut, palm kernel, or palm oil. Seasoning and other foods Salted popcorn and pretzels. Onion salt, garlic salt, seasoned salt, table salt, and sea salt. Worcestershire sauce. Tartar sauce. Barbecue sauce. Teriyaki sauce. Soy sauce, including reduced-sodium. Steak sauce. Canned and packaged gravies. Fish sauce. Oyster sauce. Cocktail sauce. Horseradish that you find on the  shelf. Ketchup. Mustard. Meat flavorings and tenderizers. Bouillon cubes. Hot sauce and Tabasco sauce. Premade or packaged marinades. Premade or packaged taco seasonings. Relishes. Regular salad dressings. Where to find more information:  National Heart, Lung, and Blood Institute: PopSteam.is  American Heart Association: www.heart.org Summary  The DASH eating plan is a healthy eating plan that has been shown to reduce high blood pressure (hypertension). It may also reduce your risk for type 2 diabetes, heart disease, and stroke.  With the DASH eating plan, you should limit salt (sodium) intake to 2,300 mg a  day. If you have hypertension, you may need to reduce your sodium intake to 1,500 mg a day.  When on the DASH eating plan, aim to eat more fresh fruits and vegetables, whole grains, lean proteins, low-fat dairy, and heart-healthy fats.  Work with your health care provider or diet and nutrition specialist (dietitian) to adjust your eating plan to your individual calorie needs. This information is not intended to replace advice given to you by your health care provider. Make sure you discuss any questions you have with your health care provider. Document Released: 02/15/2011 Document Revised: 02/20/2016 Document Reviewed: 02/20/2016 Elsevier Interactive Patient Education  2018 [employer].   Steps to Quit Smoking Smoking tobacco can be bad for your health. It can also affect almost every organ in your body. Smoking puts you and people around you at risk for many serious long-lasting (chronic) diseases. Quitting smoking is hard, but it is one of the best things that you can do for your health. It is never too late to quit. What are the benefits of quitting smoking? When you quit smoking, you lower your risk for getting serious diseases and conditions. They can include:  Lung cancer or lung disease.  Heart disease.  Stroke.  Heart attack.  Not being able to have children (infertility).  Weak bones (osteoporosis) and broken bones (fractures).  If you have coughing, wheezing, and shortness of breath, those symptoms may get better when you quit. You may also get sick less often. If you are pregnant, quitting smoking can help to lower your chances of having a baby of low birth weight. What can I do to help me quit smoking? Talk with your doctor about what can help you quit smoking. Some things you can do (strategies) include:  Quitting smoking totally, instead of slowly cutting back how much you smoke over a period of time.  Going to in-person counseling. You are more likely to quit if you  go to many counseling sessions.  Using resources and support systems, such as: ? Agricultural engineer with a Veterinary surgeon. ? Phone quitlines. ? Automotive engineer. ? Support groups or group counseling. ? Text messaging programs. ? Mobile phone apps or applications.  Taking medicines. Some of these medicines may have nicotine in them. If you are pregnant or breastfeeding, do not take any medicines to quit smoking unless your doctor says it is okay. Talk with your doctor about counseling or other things that can help you.  Talk with your doctor about using more than one strategy at the same time, such as taking medicines while you are also going to in-person counseling. This can help make quitting easier. What things can I do to make it easier to quit? Quitting smoking might feel very hard at first, but there is a lot that you can do to make it easier. Take these steps:  Talk to your family and friends. Ask them to support and encourage  you.  Call phone quitlines, reach out to support groups, or work with a Veterinary surgeon.  Ask people who smoke to not smoke around you.  Avoid places that make you want (trigger) to smoke, such as: ? Bars. ? Parties. ? Smoke-break areas at work.  Spend time with people who do not smoke.  Lower the stress in your life. Stress can make you want to smoke. Try these things to help your stress: ? Getting regular exercise. ? Deep-breathing exercises. ? Yoga. ? Meditating. ? Doing a body scan. To do this, close your eyes, focus on one area of your body at a time from head to toe, and notice which parts of your body are tense. Try to relax the muscles in those areas.  Download or buy apps on your mobile phone or tablet that can help you stick to your quit plan. There are many free apps, such as QuitGuide from the [employer] developer for Disease Control and Prevention). You can find more support from smokefree.gov and other websites.  This information is not intended to  replace advice given to you by your health care provider. Make sure you discuss any questions you have with your health care provider. Document Released: 12/23/2008 Document Revised: 10/25/2015 Document Reviewed: 07/13/2014 Elsevier Interactive Patient Education  2018 [employer].   Rivaroxaban oral tablets What is this medicine? RIVAROXABAN (ri va ROX a ban) is an anticoagulant (blood thinner). It is used to treat blood clots in the lungs or in the veins. It is also used after knee or hip surgeries to prevent blood clots. It is also used to lower the chance of stroke in people with a medical condition called atrial fibrillation. This medicine may be used for other purposes; ask your health care provider or pharmacist if you have questions. COMMON BRAND NAME(S): Xarelto, Xarelto Starter Pack What should I tell my health care provider before I take this medicine? They need to know if you have any of these conditions: -bleeding disorders -bleeding in the brain -blood in your stools (black or tarry stools) or if you have blood in your vomit -history of stomach bleeding -kidney disease -liver disease -low blood counts, like low white cell, platelet, or red cell counts -recent or planned spinal or epidural procedure -take medicines that treat or prevent blood clots -an unusual or allergic reaction to rivaroxaban, other medicines, foods, dyes, or preservatives -pregnant or trying to get pregnant -breast-feeding How should I use this medicine? Take this medicine by mouth with a glass of water. Follow the directions on the prescription label. Take your medicine at regular intervals. Do not take it more often than directed. Do not stop taking except on your doctor's advice. Stopping this medicine may increase your risk of a blood clot. Be sure to refill your prescription before you run out of medicine. If you are taking this medicine after hip or knee replacement surgery, take it with or without  food. If you are taking this medicine for atrial fibrillation, take it with your evening meal. If you are taking this medicine to treat blood clots, take it with food at the same time each day. If you are unable to swallow your tablet, you may crush the tablet and mix it in applesauce. Then, immediately eat the applesauce. You should eat more food right after you eat the applesauce containing the crushed tablet. Talk to your pediatrician regarding the use of this medicine in children. Special care may be needed. Overdosage: If you think you  have taken too much of this medicine contact a poison control center or emergency room at once. NOTE: This medicine is only for you. Do not share this medicine with others. What if I miss a dose? If you take your medicine once a day and miss a dose, take the missed dose as soon as you remember. If you take your medicine twice a day and miss a dose, take the missed dose immediately. In this instance, 2 tablets may be taken at the same time. The next day you should take 1 tablet twice a day as directed. What may interact with this medicine? Do not take this medicine with any of the following medications: -defibrotide This medicine may also interact with the following medications: -aspirin and aspirin-like medicines -certain antibiotics like erythromycin, azithromycin, and clarithromycin -certain medicines for fungal infections like ketoconazole and itraconazole -certain medicines for irregular heart beat like amiodarone, quinidine, dronedarone -certain medicines for seizures like carbamazepine, phenytoin -certain medicines that treat or prevent blood clots like warfarin, enoxaparin, and dalteparin -conivaptan -diltiazem -felodipine -indinavir -lopinavir; ritonavir -NSAIDS, medicines for pain and inflammation, like ibuprofen or naproxen -ranolazine -rifampin -ritonavir -SNRIs, medicines for depression, like desvenlafaxine, duloxetine, levomilnacipran,  venlafaxine -SSRIs, medicines for depression, like citalopram, escitalopram, fluoxetine, fluvoxamine, paroxetine, sertraline -St. John's wort -verapamil This list may not describe all possible interactions. Give your health care provider a list of all the medicines, herbs, non-prescription drugs, or dietary supplements you use. Also tell them if you smoke, drink alcohol, or use illegal drugs. Some items may interact with your medicine. What should I watch for while using this medicine? Visit your doctor or health care professional for regular checks on your progress. Notify your doctor or health care professional and seek emergency treatment if you develop breathing problems; changes in vision; chest pain; severe, sudden headache; pain, swelling, warmth in the leg; trouble speaking; sudden numbness or weakness of the face, arm or leg. These can be signs that your condition has gotten worse. If you are going to have surgery or other procedure, tell your doctor that you are taking this medicine. What side effects may I notice from receiving this medicine? Side effects that you should report to your doctor or health care professional as soon as possible: -allergic reactions like skin rash, itching or hives, swelling of the face, lips, or tongue -back pain -redness, blistering, peeling or loosening of the skin, including inside the mouth -signs and symptoms of bleeding such as bloody or black, tarry stools; red or dark-brown urine; spitting up blood or brown material that looks like coffee grounds; red spots on the skin; unusual bruising or bleeding from the eye, gums, or nose Side effects that usually do not require medical attention (report to your doctor or health care professional if they continue or are bothersome): -dizziness -muscle pain This list may not describe all possible side effects. Call your doctor for medical advice about side effects. You may report side effects to FDA at  1-800-FDA-1088. Where should I keep my medicine? Keep out of the reach of children. Store at room temperature between 15 and 30 degrees C (59 and 86 degrees F). Throw away any unused medicine after the expiration date. NOTE: This sheet is a summary. It may not cover all possible information. If you have questions about this medicine, talk to your doctor, pharmacist, or health care provider.  2018 Elsevier/Gold Standard (2015-11-16 16:29:33)

## 2017-09-10 NOTE — Progress Notes (Signed)
   Subjective:    Patient ID: Ernest White, male    DOB: Feb 20, 1956, 62 y.o.   MRN: [id number]  HPI [name] is a 62yo male here today for hospital follow up. He was admitted on 09/05/2017 for bilateral pulmonary emboli as confirmed by CTA. His condition improved throughout his stay and he was discharged on 09/07/2017 with xarelto and instructions to follow up with his PCP. Today, he appears well and in mild distress. He states that he is still experiencing shortness of breath even with mild exertion and low energy levels which is how he described feeling for several weeks. He also describes abdominal bloating and discomfort for 3 weeks.  He describes the discomfort as a pressure on his stomach.  The discomfort is constant.  He describes having normal bowel movements daily.  He denies nausea or vomiting or blood in his stools.   Review of Systems  Pertinent ROS are noted in the HPI.     Objective:   Physical Exam  Constitutional: He appears distressed.  HENT:  Head: Normocephalic.  Eyes: Pupils are equal, round, and reactive to light.  Cardiovascular: Regular rhythm, S1 normal, S2 normal and intact distal pulses. Tachycardia present.  Pulmonary/Chest: Breath sounds normal. Accessory muscle usage present. He is in respiratory distress. He has no decreased breath sounds. He has no wheezes. He has no rhonchi. He has no rales.  Abdominal: Soft. He exhibits distension. He exhibits no shifting dullness, no pulsatile liver, no fluid wave, no abdominal bruit, no ascites, no pulsatile midline mass and no mass. Bowel sounds are hypoactive. There is generalized tenderness. There is no rigidity, no rebound and no guarding.  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 scars are present from both surgeries.  No current hernias are present.  Skin: He is diaphoretic.       Assessment & Plan:  Pulmonary embolism O2 sat 94 with ambulation at clinic. Continue his  Xarelto from his hospital stay and increase his dose to 20mg  once daily on 7/18 for 6 months. We discussed measures that he can take to help lower his risk of another occurrence of PE and the signs he should look out for to return to the ED.  T2DM Educated patient on DASH diet and strict control of his blood sugar levels to help improve his weight and energy levels.  Multiple lung nodules on CT Discussed results with patient. Referral to obtain repeat CT in 3 months.  Smoking Patient has been tobacco free since hospital admission and I encouraged him to keep it up. Discussed the benefits of continued smoking cessation. He is currently using a nicotine patch.

## 2017-09-10 NOTE — Progress Notes (Signed)
[name] is a 62yo male here today for hospital follow up. He was admitted on 09/05/2017 for bilateral pulmonary emboli as confirmed by CTA. His condition improved throughout his stay and he was discharged on 09/07/2017 with medications and instructions to follow up with his PCP. Today, he appears well and in no distress. I recommend he continue his medication from his hospital stay and increase his dose on 7/18 for 6 months. We discussed measures that he can take to help lower his risk of another occurrence and the signs he should look out for to return to the ED.

## 2017-09-10 NOTE — Telephone Encounter (Signed)
Contacted pt and gave him his CT appointment time and place. It is scheduled for 12/10/17 @ 10:00am with a 9:40am arrival. [facility] [address] [facility].  Ernest White, April D, [place]

## 2017-09-19 ENCOUNTER — Other Ambulatory Visit: Payer: Self-pay

## 2017-09-19 MED ORDER — NICOTINE 21 MG/24HR TD PT24: 21.0000 mg | MEDICATED_PATCH | Freq: Every day | TRANSDERMAL | 0 refills | Status: DC

## 2017-09-27 ENCOUNTER — Other Ambulatory Visit: Payer: Self-pay | Admitting: Family Medicine

## 2017-09-27 ENCOUNTER — Telehealth: Payer: Self-pay | Admitting: Family Medicine

## 2017-09-27 DIAGNOSIS — Z1211 Encounter for screening for malignant neoplasm of colon: Secondary | ICD-10-CM

## 2017-09-27 DIAGNOSIS — K644 Residual hemorrhoidal skin tags: Secondary | ICD-10-CM

## 2017-09-27 NOTE — Telephone Encounter (Signed)
Pt called and said that at his last appointment Dr. Abelardo DieselMcmullen and him discussed pt needing to have a colonoscopy. Pt said that when he tried to schedule an appointment that there had been no referral placed. He would like to have one placed as soon as possible. Pt would like to be contacted to discuss what needs to be done.

## 2017-09-27 NOTE — Telephone Encounter (Signed)
Referral placed.  Patient should expect a phone call within the next month to schedule appointment for screening colonoscopy and evaluation of external hemorrhoids.  Durward Parcelavid McMullen, DO [facility], PGY-3

## 2017-10-09 ENCOUNTER — Other Ambulatory Visit: Payer: Self-pay | Admitting: *Deleted

## 2017-10-09 MED ORDER — BUDESONIDE-FORMOTEROL FUMARATE 160-4.5 MCG/ACT IN AERO: 2.0000 | INHALATION_SPRAY | Freq: Every day | RESPIRATORY_TRACT | 3 refills | Status: DC

## 2017-10-28 ENCOUNTER — Telehealth: Payer: Self-pay | Admitting: *Deleted

## 2017-10-28 ENCOUNTER — Ambulatory Visit: Payer: Medicaid Other

## 2017-10-28 NOTE — Telephone Encounter (Signed)
Pt wants to let MD know that he is unable to make appt today due to transportation issues.  He was rescheduled for 11/25/17. Quantarius Genrich, Maryjo RochesterJessica Dawn, CMA

## 2017-10-28 NOTE — Telephone Encounter (Signed)
Noted. Thanks.  David McMullen, DO [facility], PGY-3  

## 2017-11-02 ENCOUNTER — Encounter (HOSPITAL_COMMUNITY): Payer: Self-pay | Admitting: Student

## 2017-11-02 ENCOUNTER — Emergency Department (HOSPITAL_COMMUNITY)
Admission: EM | Admit: 2017-11-02 | Discharge: 2017-11-03 | Disposition: A | Discharge status: Home / Self Care | Payer: Medicaid Other | Attending: Emergency Medicine | Admitting: Emergency Medicine

## 2017-11-02 ENCOUNTER — Other Ambulatory Visit: Payer: Self-pay

## 2017-11-02 DIAGNOSIS — S80211A Abrasion, right knee, initial encounter: Secondary | ICD-10-CM | POA: Diagnosis not present

## 2017-11-02 DIAGNOSIS — Z7984 Long term (current) use of oral hypoglycemic drugs: Secondary | ICD-10-CM | POA: Diagnosis not present

## 2017-11-02 DIAGNOSIS — Y929 Unspecified place or not applicable: Secondary | ICD-10-CM | POA: Diagnosis not present

## 2017-11-02 DIAGNOSIS — Z79899 Other long term (current) drug therapy: Secondary | ICD-10-CM | POA: Diagnosis not present

## 2017-11-02 DIAGNOSIS — Y999 Unspecified external cause status: Secondary | ICD-10-CM | POA: Insufficient documentation

## 2017-11-02 DIAGNOSIS — Z7901 Long term (current) use of anticoagulants: Secondary | ICD-10-CM | POA: Insufficient documentation

## 2017-11-02 DIAGNOSIS — J449 Chronic obstructive pulmonary disease, unspecified: Secondary | ICD-10-CM | POA: Diagnosis not present

## 2017-11-02 DIAGNOSIS — Y9301 Activity, walking, marching and hiking: Secondary | ICD-10-CM | POA: Insufficient documentation

## 2017-11-02 DIAGNOSIS — S8261XA Displaced fracture of lateral malleolus of right fibula, initial encounter for closed fracture: Secondary | ICD-10-CM

## 2017-11-02 DIAGNOSIS — S99922A Unspecified injury of left foot, initial encounter: Secondary | ICD-10-CM | POA: Diagnosis present

## 2017-11-02 DIAGNOSIS — I10 Essential (primary) hypertension: Secondary | ICD-10-CM | POA: Insufficient documentation

## 2017-11-02 DIAGNOSIS — W010XXA Fall on same level from slipping, tripping and stumbling without subsequent striking against object, initial encounter: Secondary | ICD-10-CM | POA: Diagnosis not present

## 2017-11-02 DIAGNOSIS — S92511A Displaced fracture of proximal phalanx of right lesser toe(s), initial encounter for closed fracture: Secondary | ICD-10-CM | POA: Diagnosis not present

## 2017-11-02 DIAGNOSIS — W19XXXA Unspecified fall, initial encounter: Secondary | ICD-10-CM

## 2017-11-02 DIAGNOSIS — E119 Type 2 diabetes mellitus without complications: Secondary | ICD-10-CM | POA: Diagnosis not present

## 2017-11-02 NOTE — ED Provider Notes (Signed)
[facility] EMERGENCY DEPARTMENT Provider Note   CSN: [id number] Arrival date & time: 11/02/17  2318   History   Chief Complaint Chief Complaint  Patient presents with  . Fall    HPI Ernest White is a 62 y.o. male with a history of COPD, type 2 diabetes mellitus, gout, prior pulmonary embolism on Xarelto and hypertension who arrives to the ED via EMS status post mechanical fall complaining of right ankle/foot pain.  Patient states that he was walking and slipped on a wet metal grate.  He states that this resulted in him putting all of his weight onto the right lower extremity.  He subsequently fell to the ground resulting in an abrasion to the right knee.  No head injury or loss of consciousness.  No prodromal chest pain, dyspnea, lightheadedness, or dizziness.  Patient has not been able to bear weight on the right lower extremity since.  He states he is having pain to the right ankle in the right foot with associated swelling.  Current pain is severe, worse with movement, no alleviating factors.  No intervention prior to arrival.  Denies numbness, weakness, or any other areas of injury. Patient walks without assistance device at baseline.   HPI  Past Medical History:  Diagnosis Date  . Arthritis   . COPD (chronic obstructive pulmonary disease) ([place])   . Diabetes mellitus without complication ([place])   . Gout   . Hypertension     Patient Active Problem List   Diagnosis Date Noted  . Pulmonary nodules/lesions, multiple 09/10/2017  . Acute pulmonary embolism without acute cor pulmonale ([place]) 09/10/2017  . Diabetes 1.5, managed as type 2 ([place]) 09/10/2017  . Smoking 09/10/2017  . Multiple lung nodules on CT 09/10/2017  . Chest pain 09/05/2017  . Pulmonary emboli ([place]) 09/05/2017  . Snoring 04/23/2017  . Type 2 diabetes mellitus without complication, without long-term current use of insulin ([place]) 03/20/2017  . Erectile dysfunction 08/16/2016  . Frequent  No-show for appointment 07/20/2016  . Tobacco use disorder 06/14/2016  . External hemorrhoids 06/03/2016  . Syncope and collapse 12/27/2015  . Uncontrolled diabetes mellitus ([place]) 12/27/2015  . Chronic obstructive airway disease ([place]) 12/23/2015  . Essential hypertension 12/15/2015    Past Surgical History:  Procedure Laterality Date  . APPENDECTOMY    . HERNIA REPAIR          Home Medications    Prior to Admission medications   Medication Sig Start Date End Date Taking? Authorizing Provider  ACCU-CHEK AVIVA PLUS test strip USE AS DIRECTED TO CHECK BLOOD GLUCOSE UP TO TWICE DAILY 07/19/17   Mango Bing, DO  acetaminophen (TYLENOL) 500 MG tablet Take 1,000 mg by mouth every 6 (six) hours as needed for mild pain.    [provider]  albuterol (PROVENTIL HFA;VENTOLIN HFA) 108 (90 Base) MCG/ACT inhaler Inhale 2 puffs into the lungs every 6 (six) hours as needed for wheezing or shortness of breath. 03/20/17   Nuala Alpha, DO  BAYER MICROLET LANCETS lancets Use as instructed 06/13/16   [place] Bing, DO  Blood Glucose Monitoring Suppl (ACCU-CHEK AVIVA PLUS) w/Device KIT Use as directed to check blood glucose up to twice daily. E11.9.  May substitute for formulary preferred. 06/14/16   Zenia Resides, MD  budesonide-formoterol (SYMBICORT) 160-4.5 MCG/ACT inhaler Inhale 2 puffs into the lungs daily. 10/09/17   [place] Bing, DO  glucose blood (ACCU-CHEK AVIVA PLUS) test strip Use as directed to check blood glucose  up to twice daily. E11.9.  May substitute for formulary preferred. 03/20/17   Nuala Alpha, DO  hydrochlorothiazide (HYDRODIURIL) 25 MG tablet Take 1 tablet (25 mg total) by mouth daily. 04/03/17   [place] Bing, DO  ibuprofen (ADVIL,MOTRIN) 200 MG tablet Take 400 mg by mouth every 6 (six) hours as needed for moderate pain.    [provider]  lisinopril (PRINIVIL,ZESTRIL) 40 MG tablet Take 1 tablet (40 mg total) by mouth daily. 04/03/17    [place] Bing, DO  metFORMIN (GLUCOPHAGE) 500 MG tablet TAKE 1 TABLET (500 MG TOTAL) BY MOUTH 2 TIMES DAILY WITH A MEAL. Patient not taking: Reported on 09/06/2017 06/18/17   [place] Bing, DO  metFORMIN (GLUCOPHAGE) 500 MG tablet TAKE 1 TABLET BY MOUTH TWICE DAILY WITH A MEAL 08/16/17   [place] Bing, DO  nicotine (NICODERM CQ - DOSED IN MG/24 HOURS) 14 mg/24hr patch Place 1 patch (14 mg total) onto the skin daily. Patient not taking: Reported on 09/06/2017 04/23/17   [place] Bing, DO  nicotine (NICODERM CQ - DOSED IN MG/24 HOURS) 21 mg/24hr patch Place 1 patch (21 mg total) onto the skin daily. 09/19/17   [place] Bing, DO  rivaroxaban (XARELTO) 20 MG TABS tablet Take 1 tablet (20 mg total) by mouth daily with supper. 09/10/17   Anderson, Chelsey L, DO  rivaroxaban (XARELTO) 20 MG TABS tablet TAKE 1 TABLET (20 MG) BY MOUTH DAILY WITH A MEAL 09/27/17   [place] Bing, DO  sildenafil (REVATIO) 20 MG tablet Take 1 tablet (20 mg total) by mouth 3 (three) times daily. Take as needed 30 minutes to 1 hour before sexual activity. Take no more than 5 in one day total. 03/20/17   Lockamy, Timothy, DO  tiotropium (SPIRIVA) 18 MCG inhalation capsule Place 1 capsule (18 mcg total) into inhaler and inhale daily. Patient not taking: Reported on 09/06/2017 07/26/16   Lovenia Kim, MD  vitamin B-12 (CYANOCOBALAMIN) 1000 MCG tablet Take 1,000 mcg by mouth daily.    [provider]    Family History Family History  Problem Relation Age of Onset  . Deep vein thrombosis Brother   . Pulmonary embolism Brother   . Cancer - Lung Father   . Cancer Sister        breast  . Cancer Sister        throat  . Other Sister        back problems  . Heart disease Sister     Social History Social History   Tobacco Use  . Smoking status: Current Every Day Smoker    Packs/day: 1.00    Years: 48.00    Pack years: 48.00    Types: Cigarettes  . Smokeless tobacco: Never Used  . Tobacco comment:  Works at tobacco plant  Substance Use Topics  . Alcohol use: Yes    Alcohol/week: 1.0 standard drinks    Types: 1 Cans of beer per week    Comment: "moderately"  . Drug use: No     Allergies   Penicillins and Aspirin   Review of Systems Review of Systems  Constitutional: Negative for chills and fever.  Eyes: Negative for visual disturbance.  Respiratory: Negative for shortness of breath.   Cardiovascular: Negative for chest pain and palpitations.  Musculoskeletal: Positive for arthralgias and joint swelling.  Neurological: Negative for dizziness, weakness, light-headedness, numbness and headaches.     Physical Exam Updated Vital Signs BP (!) 153/104 (BP Location: Right Arm)  Pulse 82   Temp 98 F (36.7 C) (Oral)   Resp 18   Wt 129.3 kg   SpO2 96%   BMI 39.75 kg/m   Physical Exam  Constitutional: He appears well-developed and well-nourished.  Non-toxic appearance. No distress.  HENT:  Head: Normocephalic and atraumatic. Head is without raccoon's eyes and without Battle's sign.  Eyes: Pupils are equal, round, and reactive to light. Conjunctivae are normal. Right eye exhibits no discharge. Left eye exhibits no discharge.  Neck: Normal range of motion. Neck supple. No spinous process tenderness present.  Cardiovascular: Normal rate and regular rhythm.  Pulses:      Dorsalis pedis pulses are 2+ on the right side, and 2+ on the left side.       Posterior tibial pulses are 2+ on the right side, and 2+ on the left side.  Pulmonary/Chest: Effort normal and breath sounds normal.  Abdominal: Soft. He exhibits no distension. There is no tenderness.  Musculoskeletal:  Patient has soft tissue swelling to the right ankle as well as the midfoot. Superficial abrasion to R knee. No other open wounds, no overlying ecchymosis or erythema, no obvious deformity. Upper extremities: Normal range of motion.  Nontender. Back: No midline tenderness Lower extremities: Patient has normal  range of motion's to bilateral hips, knees, and the left ankle.  He is able to move all digits of the toes.  He has significant limitation in right ankle plantar/dorsiflexion secondary to discomfort, able to move in each of these directions minimally.  RLE: Patient is diffusely tender to palpation at the distal lower leg, ankle, as well as the midfoot and forefoot, and all digitis.  Does have tenderness to the medial and lateral malleolus, the navicular bone, as well as the base the fifth. No tenderness to fibular head. Neurovascularly intact distally with good cap refill.  Neurological: He is alert.  Clear speech.  Sensation grossly intact bilateral lower extremities.  Patient has 5 out of 5 strength with left lower extremity plantar/dorsiflexion, unable to assess strength in right lower extremity ankle secondary to pain.  He is able to lift bilateral legs off of the bed.  Skin: Capillary refill takes less than 2 seconds.  Psychiatric: He has a normal mood and affect. His behavior is normal. Thought content normal.  Nursing note and vitals reviewed.  ED Treatments / Results  Labs (all labs ordered are listed, but only abnormal results are displayed) Labs Reviewed - No data to display  EKG None  Radiology Dg Ankle Complete Right  Result Date: 11/03/2017 CLINICAL DATA:  Fall with pain and swelling EXAM: RIGHT ANKLE - COMPLETE 3+ VIEW COMPARISON:  None. FINDINGS: Moderate plantar calcaneal spur and posterior enthesophyte. Acute minimally displaced fracture involving the fibular malleolus with probable extension of lucency to the superolateral joint. Ankle mortise is symmetric. Degenerative spurring medially and laterally. Minimal asymmetric prominence of the anterior ankle joint. IMPRESSION: Acute minimally displaced fracture involving the lateral fibular malleolus. Electronically Signed   By: Donavan Foil M.D.   On: 11/03/2017 00:41   Dg Foot Complete Right  Result Date: 11/03/2017 CLINICAL  DATA:  Fall with pain EXAM: RIGHT FOOT COMPLETE - 3+ VIEW COMPARISON:  None. FINDINGS: Plantar calcaneal spur and posterior enthesophyte. Probable acute minimally impacted fracture involving base of the fifth proximal phalanx with lucencies going to the articular surface. No subluxation. Mild degenerative changes at the first MTP joint. IMPRESSION: Suspected acute minimally impacted possibly intra-articular fracture base of the fifth proximal phalanx. Electronically Signed  By: Donavan Foil M.D.   On: 11/03/2017 00:43    Procedures Procedures (including critical care time) SPLINT APPLICATION Date/Time: 30/07/62 Authorized by: Kennith Maes Consent: Verbal consent obtained. Risks and benefits: risks, benefits and alternatives were discussed Consent given by: patient Splint applied by: RN Location details: LLE Splint type: Cam walker Post-procedure: The splinted body part was neurovascularly unchanged following the procedure. Patient tolerance: Patient tolerated the procedure well with no immediate complications.  Medications Ordered in ED Medications  oxyCODONE-acetaminophen (PERCOCET/ROXICET) 5-325 MG per tablet 1 tablet (1 tablet Oral Given 11/03/17 0159)  oxyCODONE-acetaminophen (PERCOCET/ROXICET) 5-325 MG per tablet 1 tablet (1 tablet Oral Given 11/03/17 0249)    Initial Impression / Assessment and Plan / ED Course  I have reviewed the triage vital signs and the nursing notes.  Pertinent labs & imaging results that were available during my care of the patient were reviewed by me and considered in my medical decision making (see chart for details).    Patient presents to the emergency department status post mechanical fall with complaints of right ankle/foot pain and swelling.  No evidence of serious head, neck, or back injury.  Patient's pain and swelling is to the right ankle and foot diffusely. Imaging notable for acute minimally displaced fracture involving the lateral fibular  malleolus as well as a suspected acute minimally impacted possibly intra-articular fracture at the base of the fifth proximal phalanx. He is neurovascularly intact distally.   01:35: CONSULT: Discussed with orthopedic surgeon Dr. Ninfa Linden, patient can be weight bearing as tolerated in cam walker.    Patient placed in cam walker.  Provided support of crutches to be used as needed.  Percocet prescription for pain. [place] Controlled Substance reporting System queried. PRICE.  Orthopedics follow up. I discussed results, treatment plan, need for follow-up, and return precautions with the patient. Provided opportunity for questions, patient confirmed understanding and is in agreement with plan.   Findings and plan of care discussed with supervising physician Dr. Kathrynn Humble who is in agreement with plan.   Final Clinical Impressions(s) / ED Diagnoses   Final diagnoses:  Fall, initial encounter  Closed fracture of proximal phalanx of lesser toe of right foot, physeal involvement unspecified, initial encounter  Closed displaced fracture of lateral malleolus of right fibula, initial encounter    ED Discharge Orders         Ordered    oxyCODONE-acetaminophen (PERCOCET/ROXICET) 5-325 MG tablet  Every 6 hours PRN     11/03/17 0149           Kratos Ruscitti, Brea R, PA-C 11/03/17 2633    Varney Biles, MD 11/04/17 [phone]

## 2017-11-02 NOTE — ED Triage Notes (Signed)
Patient states that he slipped on a metal grate in the street (thinks it's due to the rain). Right ankle swelling and abrasion to right knee.

## 2017-11-03 ENCOUNTER — Emergency Department (HOSPITAL_COMMUNITY): Payer: Medicaid Other

## 2017-11-03 MED ORDER — OXYCODONE-ACETAMINOPHEN 5-325 MG PO TABS
1.0000 | ORAL_TABLET | Freq: Once | ORAL | Status: AC
  Administered 2017-11-03: 1 via ORAL
  Filled 2017-11-03: qty 1

## 2017-11-03 MED ORDER — OXYCODONE-ACETAMINOPHEN 5-325 MG PO TABS: 1.0000 | ORAL_TABLET | Freq: Four times a day (QID) | ORAL | 0 refills | Status: DC | PRN

## 2017-11-03 NOTE — Discharge Instructions (Addendum)
You are seen in the emergency department today for a fall and subsequent injury to your right ankle and foot.  It appears that you have broken bones to the outer aspect of the ankle as well as to the fifth toe.  We placed you in a cam walker boot and have provided you with crutches.  Please keep the foot in the boot and utilize crutches as needed when walking.  If you feel comfortable walking without the crutches you may do so.  We are sending you with a prescription for Percocet for pain. -Percocet-this is a narcotic/controlled substance medication that has potential addicting qualities.  We recommend that you take 1-2 tablets every 6 hours as needed for severe pain.  Do not drive or operate heavy machinery when taking this medicine as it can be sedating. Do not drink alcohol or take other sedating medications when taking this medicine for safety reasons.  Keep this out of reach of small children.  Please be aware this medicine has Tylenol in it (325 mg/tab) do not exceed the maximum dose of Tylenol in a day per over the counter recommendations should you decide to supplement with Tylenol over the counter.    We have prescribed you new medication(s) today. Discuss the medications prescribed today with your pharmacist as they can have adverse effects and interactions with your other medicines including over the counter and prescribed medications. Seek medical evaluation if you start to experience new or abnormal symptoms after taking one of these medicines, seek care immediately if you start to experience difficulty breathing, feeling of your throat closing, facial swelling, or rash as these could be indications of a more serious allergic reaction  Please keep the leg elevated whenever possible.  Please rest as much as you can.  Please place an ice pack or ice placed into a plastic bag over top of the ankle/foot in the cam walker he minutes on 40 minutes off for the next 24-48 area hours.  We need you to  follow-up with orthopedic surgeon Dr. Magnus IvanBlackman within the next 5 days for reevaluation.  Return to the ER for new or worsening symptoms including but not limited to worsening pain, numbness, weakness, toes turning blue/grey, or any other concerns.

## 2017-11-03 NOTE — ED Notes (Signed)
The pt returned from xray 

## 2017-11-03 NOTE — ED Notes (Signed)
Pt fell injuring his rt foot and ankle approx 2 hours ago.  He has swelling to his rt foot  The pt has not taken any over-the -counter pain med  He came in by ambulance

## 2017-11-03 NOTE — ED Notes (Signed)
Cam walker and crutches given to pt. Received verbal confirmation pt understood instructions, use, and had no further questions upon application from this EMT.

## 2017-11-03 NOTE — ED Notes (Signed)
Ernest White (son)  [phone]; please contact if patient is being admitted or D/C ( Son is the transportation home)   

## 2017-11-03 NOTE — ED Notes (Signed)
The pt says he has no money  Until monday

## 2017-11-03 NOTE — ED Notes (Signed)
Clint Wilson (son)  [phone]; please contact if patient is being admitted or D/C ( Son is the transportation home)   

## 2017-11-08 ENCOUNTER — Telehealth: Payer: Self-pay | Admitting: Family Medicine

## 2017-11-08 ENCOUNTER — Other Ambulatory Visit: Payer: Self-pay | Admitting: Family Medicine

## 2017-11-08 DIAGNOSIS — Z1211 Encounter for screening for malignant neoplasm of colon: Secondary | ICD-10-CM

## 2017-11-08 DIAGNOSIS — S92354S Nondisplaced fracture of fifth metatarsal bone, right foot, sequela: Secondary | ICD-10-CM

## 2017-11-08 MED ORDER — OXYCODONE-ACETAMINOPHEN 5-325 MG PO TABS: 1.0000 | ORAL_TABLET | Freq: Four times a day (QID) | ORAL | 0 refills | Status: DC | PRN

## 2017-11-08 NOTE — Telephone Encounter (Signed)
Done.  David McMullen, DO [facility], PGY-3  

## 2017-11-08 NOTE — Telephone Encounter (Signed)
Pt would like to talk to dr Abelardo Dieselmcmullen.  He fell Saturday and has fractured his ankle.  His leg is very swollen and hurting.  It is hurting so bad he cant stand it.  He has used all the pain medication given to him at the hospital.  Please call him to discuss

## 2017-11-14 ENCOUNTER — Ambulatory Visit (INDEPENDENT_AMBULATORY_CARE_PROVIDER_SITE_OTHER): Payer: Medicaid Other | Admitting: Orthopaedic Surgery

## 2017-11-20 ENCOUNTER — Telehealth (INDEPENDENT_AMBULATORY_CARE_PROVIDER_SITE_OTHER): Payer: Self-pay | Admitting: Orthopaedic Surgery

## 2017-11-20 NOTE — Telephone Encounter (Signed)
See below

## 2017-11-20 NOTE — Telephone Encounter (Signed)
Patient was seen at [facility] ED 8/24. He couldn't come to his appt with Dr. Magnus Ivan on 9/5 due to a family emergency. I rescheduled him to 9/18. He is requesting a refill on the pain medication he received while at the ED. Please advise #  [phone]

## 2017-11-20 NOTE — Telephone Encounter (Signed)
Do run a report on his narcotics before I prescribe anything. Thanks

## 2017-11-21 MED ORDER — HYDROCODONE-ACETAMINOPHEN 5-325 MG PO TABS: 1.0000 | ORAL_TABLET | Freq: Four times a day (QID) | ORAL | 0 refills | Status: DC | PRN

## 2017-11-21 NOTE — Telephone Encounter (Signed)
11/08/17 Qty 12  [place] 11/04/17 Qty 12  Dr. Harvie HeckSamantha Petrucelli  (ER) 08/18/16  Qty 10  Dr. Margarita Grizzleanielle Ray (ER)  This is all that's on it

## 2017-11-21 NOTE — Telephone Encounter (Signed)
Patient aware.

## 2017-11-21 NOTE — Telephone Encounter (Signed)
I will send in a one time script only for Norco.  That is all I can give.

## 2017-11-25 ENCOUNTER — Ambulatory Visit (INDEPENDENT_AMBULATORY_CARE_PROVIDER_SITE_OTHER): Payer: Medicaid Other | Admitting: Family Medicine

## 2017-11-25 ENCOUNTER — Encounter: Payer: Self-pay | Admitting: Family Medicine

## 2017-11-25 VITALS — BP 140/100 | HR 95 | Temp 98.6°F | Ht 71.0 in | Wt 287.0 lb

## 2017-11-25 DIAGNOSIS — I10 Essential (primary) hypertension: Secondary | ICD-10-CM | POA: Diagnosis not present

## 2017-11-25 DIAGNOSIS — J441 Chronic obstructive pulmonary disease with (acute) exacerbation: Secondary | ICD-10-CM

## 2017-11-25 DIAGNOSIS — E0865 Diabetes mellitus due to underlying condition with hyperglycemia: Secondary | ICD-10-CM | POA: Diagnosis not present

## 2017-11-25 DIAGNOSIS — S92351A Displaced fracture of fifth metatarsal bone, right foot, initial encounter for closed fracture: Secondary | ICD-10-CM

## 2017-11-25 DIAGNOSIS — Z23 Encounter for immunization: Secondary | ICD-10-CM | POA: Diagnosis not present

## 2017-11-25 DIAGNOSIS — J449 Chronic obstructive pulmonary disease, unspecified: Secondary | ICD-10-CM

## 2017-11-25 LAB — POCT GLYCOSYLATED HEMOGLOBIN (HGB A1C): HBA1C, POC (CONTROLLED DIABETIC RANGE): 7.8 % — AB (ref 0.0–7.0)

## 2017-11-25 MED ORDER — ALBUTEROL SULFATE HFA 108 (90 BASE) MCG/ACT IN AERS: 2.0000 | INHALATION_SPRAY | Freq: Four times a day (QID) | RESPIRATORY_TRACT | 2 refills | Status: DC | PRN

## 2017-11-25 NOTE — Assessment & Plan Note (Addendum)
Chronic. Suboptimal control, A1c 7.8.  - Given multiple current illness states, will defer changes to meds until next visit - Administered annual flu vaccine

## 2017-11-25 NOTE — Progress Notes (Signed)
Subjective   Patient ID: Ernest White Guizar    DOB: 1955-05-21, 62 y.o. male   MRN: [id number]  CC: "Broken foot"  HPI: Ernest White Lober is a 62 y.o. male who presents to clinic today for the following:  Broken distal fifth right metatarsal: Patient sustained a mechanical fall back on 11/02/2017 and was subsequently [facility] ED found to have a distal right fifth metatarsal fracture not requiring surgical intervention.  Patient did receive cam boot and has been using oxycodone for pain control.  He has a scheduled follow-up appointment with Dr. Magnus IvanBlackman for orthopedics in 2 days.  He reports not using his boot recently due to irritation to the skin and constriction of his right foot.  Cold: Patient reports several days of sneezing, nasal congestion, sneeze, rhinorrhea.  He reports use of his Symbicort daily and recently ran out of his albuterol.  He denies wheezing but does report some shortness of breath.  He denies chest pain or palpitations.    Diabetes: Patient has long history of diabetes uncontrolled without insulin use.  He reports good adherence to his metformin.  Patient reports making attempts to avoid excessive carbohydrate intake.  Hypertension: Patient reports good adherence to antihypertensives.  He is a current everyday smoker currently making attempts with nicotine patch.   ROS: see HPI for pertinent.  PMFSH: HTN, COPD, NIDDM, tobacco use disorder, ED.Surgical history appendectomy and hernia. Family history unremarkable. Smoking status reviewed. Medications reviewed.  Objective   BP (!) 140/100 (BP Location: Right Arm, Patient Position: Sitting, Cuff Size: Large)   Pulse 95   Temp 98.6 F (37 C) (Oral)   Ht 5\' 11"  (1.803 m)   Wt 287 lb (130.2 kg)   SpO2 98%   BMI 40.03 kg/m  Vitals and nursing note reviewed.  General: well nourished, well developed, NAD with non-toxic appearance HEENT: normocephalic, atraumatic, moist mucous membranes, string sign present on ears  bilaterally, nasopharynx with erythema Neck: supple, non-tender without lymphadenopathy Cardiovascular: regular rate and rhythm without murmurs, rubs, or gallops Lungs: clear to auscultation bilaterally with normal work of breathing, nonproductive cough present Skin: warm, dry, cap refill < 2 seconds, there are multiple superficial scabs merrily on lateral right ankle Extremities: warm and well perfused, normal tone, no edema, passive range of motion intact bilateral lower extremities, moderate tenderness to distal right fifth metatarsal, 2+ dorsal pedal pulse bilaterally, diminished sensation to the peroneal distribution of the right lower extremity  Assessment & Plan   Uncontrolled diabetes mellitus (HCC) Chronic. Suboptimal control, A1c 7.8.  - Given multiple current illness states, will defer changes to meds until next visit - Administered annual flu vaccine  Chronic obstructive airway disease (HCC) Chronic. May have an exacerbation though minimal use of rescue inhaler and clear lungs on exam with good oxygenation. Likely in the setting of viral URI. - Given refill for albuterol inhaler and continue Symbicort and supportive treatments - Reviewed return precations  Essential hypertension Chronic. Uncontrolled. Likely inaccurate due to foot fracture and cold. - Continue current regimen with lisinopril 40 mg daily and HCTZ 25 mg daily - Discussed importance of smoking cessation  Closed non-physeal fracture of fifth metatarsal bone of right foot Acute. Stable. 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  - Given short cast for better adherence - Discussed discontinuing opioid refills and opted for ibuprofen and acetamenophen for pain control - Follow-up with orthopedics  Orders Placed This Encounter  Procedures  .  Flu Vaccine QUAD 36+ mos IM  . HgB A1c   Meds ordered this encounter  Medications  . albuterol (PROVENTIL HFA;VENTOLIN  HFA) 108 (90 Base) MCG/ACT inhaler    Sig: Inhale 2 puffs into the lungs every 6 (six) hours as needed for wheezing or shortness of breath.    Dispense:  1 Inhaler    Refill:  2    Durward Parcel, DO [facility], PGY-3 11/25/2017, 7:19 PM

## 2017-11-25 NOTE — Assessment & Plan Note (Signed)
Acute. Stable. 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  - Given short cast for better adherence - Discussed discontinuing opioid refills and opted for ibuprofen and acetamenophen for pain control - Follow-up with orthopedics

## 2017-11-25 NOTE — Assessment & Plan Note (Addendum)
Chronic. May have an exacerbation though minimal use of rescue inhaler and clear lungs on exam with good oxygenation. Likely in the setting of viral URI. - Given refill for albuterol inhaler and continue Symbicort and supportive treatments - Reviewed return precations

## 2017-11-25 NOTE — Patient Instructions (Signed)
Thank you for coming in to see Ernest White today. Please see below to review our plan for today's visit.  1.  Your fracture should heal typically 4-6 weeks.  I have provided you a different kind of boot which you need to wear at all times.  You can take it off when you go to sleep but make sure you do not bear weight on your right foot without the boot.  Follow-up with Dr. Magnus IvanBlackman on 11/27/2017.  You can take up to 800 mg of ibuprofen every 8 hours and 1000 mg of Tylenol every 6 hours as needed for pain. 2.  I have refilled your albuterol inhaler.  Take 2 puffs every 6 hours as needed for worsening shortness of breath or wheeze.  Please call the clinic at [phone] if your symptoms worsen or you have any concerns. It was our pleasure to serve you.  Durward Parcelavid McMullen, DO [facility], PGY-3

## 2017-11-25 NOTE — Assessment & Plan Note (Addendum)
Chronic. Uncontrolled. Likely inaccurate due to foot fracture and cold. - Continue current regimen with lisinopril 40 mg daily and HCTZ 25 mg daily - Discussed importance of smoking cessation

## 2017-11-27 ENCOUNTER — Ambulatory Visit (INDEPENDENT_AMBULATORY_CARE_PROVIDER_SITE_OTHER): Payer: Medicaid Other | Admitting: Physician Assistant

## 2017-12-03 ENCOUNTER — Other Ambulatory Visit: Payer: Self-pay | Admitting: Student in an Organized Health Care Education/Training Program

## 2017-12-05 ENCOUNTER — Ambulatory Visit (INDEPENDENT_AMBULATORY_CARE_PROVIDER_SITE_OTHER): Payer: Medicaid Other | Admitting: Physician Assistant

## 2017-12-10 ENCOUNTER — Other Ambulatory Visit: Payer: Medicaid Other

## 2017-12-10 ENCOUNTER — Ambulatory Visit
Admission: RE | Admit: 2017-12-10 | Discharge: 2017-12-10 | Disposition: A | Discharge status: Home / Self Care | Payer: Medicaid Other | Source: Ambulatory Visit | Attending: Family Medicine | Admitting: Family Medicine

## 2017-12-10 DIAGNOSIS — R918 Other nonspecific abnormal finding of lung field: Secondary | ICD-10-CM

## 2017-12-16 ENCOUNTER — Ambulatory Visit (INDEPENDENT_AMBULATORY_CARE_PROVIDER_SITE_OTHER): Payer: Self-pay

## 2017-12-16 ENCOUNTER — Ambulatory Visit (INDEPENDENT_AMBULATORY_CARE_PROVIDER_SITE_OTHER): Payer: Medicaid Other | Admitting: Physician Assistant

## 2017-12-16 ENCOUNTER — Encounter (INDEPENDENT_AMBULATORY_CARE_PROVIDER_SITE_OTHER): Payer: Self-pay | Admitting: Physician Assistant

## 2017-12-16 VITALS — Ht 71.0 in | Wt 284.0 lb

## 2017-12-16 DIAGNOSIS — M25571 Pain in right ankle and joints of right foot: Secondary | ICD-10-CM | POA: Diagnosis not present

## 2017-12-16 MED ORDER — HYDROCODONE-ACETAMINOPHEN 5-325 MG PO TABS: 1.0000 | ORAL_TABLET | Freq: Four times a day (QID) | ORAL | 0 refills | Status: DC | PRN

## 2017-12-16 NOTE — Progress Notes (Signed)
Office Visit Note   Patient: Ernest White           Date of Birth: [birth year]           MRN: [id number] Visit Date: 12/16/2017              Requested by: Wendee Beavers, DO [address] [place], [place] [address] PCP: Wendee Beavers, DO   Assessment & Plan: Visit Diagnoses:  1. Pain in right ankle and joints of right foot     Plan: We will place him an ASO brace weightbearing as tolerated.  If his pain increases in the right lateral ankle and he will go back in a cam walker boot.  Elevation range of motion of the ankle encouraged.  We did give him a one-time refill on the Norco.  Did check the [facility] database it appears that the last had Norco 20 prescribed by Dr. Magnus Ivan on 11/21/2017.  He is to use Norco sparingly.  See him back in 1 month 3 views of his right ankle at that time.  Follow-Up Instructions: Return in about 4 weeks (around 01/13/2018).   Orders:  Orders Placed This Encounter  Procedures  . XR Ankle Complete Right  . XR Foot Complete Right   Meds ordered this encounter  Medications  . HYDROcodone-acetaminophen (NORCO/VICODIN) 5-325 MG tablet    Sig: Take 1-2 tablets by mouth every 6 (six) hours as needed for moderate pain.    Dispense:  30 tablet    Refill:  0      Procedures: No procedures performed   Clinical Data: No additional findings.   Subjective: Chief Complaint  Patient presents with  . Right Foot - Pain  . Right Ankle - Pain    HPI  [name] is a 62 year old male who slipped on the sidewalk great on 11/02/2017.  He was seen in the emergency room where he was found to have a nondisplaced lateral malleolus fracture and a possible fifth proximal phalanx base fracture.  He was placed in a cam walker boot.  He comes in today for follow-up he states he is having low back pain due to the boot is having difficulty ambulating.  He is using a walker cane.  States he cannot sleep at night due to the ankle and back pain.  Is  having no real radicular symptoms down the leg.  Review of Systems Please see HPI otherwise negative  Objective: Vital Signs: Ht 5\' 11"  (1.803 m)   Wt 284 lb (128.8 kg)   BMI 39.61 kg/m   Physical Exam  Constitutional: He is oriented to person, place, and time. He appears well-developed and well-nourished. No distress.  Cardiovascular: Intact distal pulses.  Pulmonary/Chest: Effort normal.  Neurological: He is alert and oriented to person, place, and time.  Skin: He is not diaphoretic.  Psychiatric: He has a normal mood and affect.    Ortho Exam Right foot nontender throughout particularly over the base of the fifth proximal phalanx.  Right calf supple nontender.  Good range of motion of the right ankle without significant pain.  Tenderness over the lateral malleolus region.  Nontender over the medial malleolus. Specialty Comments:  No specialty comments available.  Imaging: Xr Ankle Complete Right  Result Date: 12/16/2017 Right ankle 3 views: Talus well located within the ankle mortise no diastases.  Consolidation changes seen lateral malleolus.  Fractures evident on the oblique view.  Fracture remains in overall good position alignment.  No other fractures identified  Xr Foot Complete Right  Result Date: 12/16/2017 Right foot 3 views: No acute fracture.  No evidence of consolidation particularly at the base of the fifth proximal phalanx.    PMFS History: Patient Active Problem List   Diagnosis Date Noted  . Closed non-physeal fracture of fifth metatarsal bone of right foot 11/25/2017  . Pulmonary nodules/lesions, multiple 09/10/2017  . Acute pulmonary embolism without acute cor pulmonale (HCC) 09/10/2017  . Diabetes 1.5, managed as type 2 (HCC) 09/10/2017  . Smoking 09/10/2017  . Multiple lung nodules on CT 09/10/2017  . Chest pain 09/05/2017  . Pulmonary emboli (HCC) 09/05/2017  . Snoring 04/23/2017  . Type 2 diabetes mellitus without complication, without long-term  current use of insulin (HCC) 03/20/2017  . Erectile dysfunction 08/16/2016  . Frequent No-show for appointment 07/20/2016  . Tobacco use disorder 06/14/2016  . External hemorrhoids 06/03/2016  . Syncope and collapse 12/27/2015  . Uncontrolled diabetes mellitus (HCC) 12/27/2015  . Chronic obstructive airway disease (HCC) 12/23/2015  . Essential hypertension 12/15/2015   Past Medical History:  Diagnosis Date  . Arthritis   . COPD (chronic obstructive pulmonary disease) (HCC)   . Diabetes mellitus without complication (HCC)   . Gout   . Hypertension     Family History  Problem Relation Age of Onset  . Deep vein thrombosis Brother   . Pulmonary embolism Brother   . Cancer - Lung Father   . Cancer Sister        breast  . Cancer Sister        throat  . Other Sister        back problems  . Heart disease Sister     Past Surgical History:  Procedure Laterality Date  . APPENDECTOMY    . HERNIA REPAIR     Social History   Occupational History  . Not on file  Tobacco Use  . Smoking status: Current Every Day Smoker    Packs/day: 1.00    Years: 48.00    Pack years: 48.00    Types: Cigarettes  . Smokeless tobacco: Never Used  . Tobacco comment: Works at tobacco plant  Substance and Sexual Activity  . Alcohol use: Yes    Alcohol/week: 1.0 standard drinks    Types: 1 Cans of beer per week    Comment: "moderately"  . Drug use: No  . Sexual activity: Never

## 2018-01-13 ENCOUNTER — Ambulatory Visit (INDEPENDENT_AMBULATORY_CARE_PROVIDER_SITE_OTHER): Payer: Medicaid Other

## 2018-01-13 ENCOUNTER — Encounter (INDEPENDENT_AMBULATORY_CARE_PROVIDER_SITE_OTHER): Payer: Self-pay | Admitting: Physician Assistant

## 2018-01-13 ENCOUNTER — Ambulatory Visit (INDEPENDENT_AMBULATORY_CARE_PROVIDER_SITE_OTHER): Payer: Medicaid Other | Admitting: Physician Assistant

## 2018-01-13 DIAGNOSIS — M25571 Pain in right ankle and joints of right foot: Secondary | ICD-10-CM

## 2018-01-13 DIAGNOSIS — M79671 Pain in right foot: Secondary | ICD-10-CM

## 2018-01-13 MED ORDER — HYDROCODONE-ACETAMINOPHEN 5-325 MG PO TABS: 1.0000 | ORAL_TABLET | Freq: Four times a day (QID) | ORAL | 0 refills | Status: DC | PRN

## 2018-01-13 MED ORDER — OXYCODONE-ACETAMINOPHEN 5-325 MG PO TABS: 1.0000 | ORAL_TABLET | ORAL | 0 refills | Status: DC | PRN

## 2018-01-13 NOTE — Progress Notes (Signed)
HPI: [name] returns today follow-up of his right ankle lateral malleolus fracture.  Is now approximately 10 weeks status post injury has been treated conservatively.  States he still having a fair amount of pain in the ankle and is asking for the ankle to be "cut off".  Is having difficulty walking some swelling.  He has been an ASO brace.  Physical exam: Right ankle tenderness over the lateral malleolus.  No tenderness of the medial malleolus.  He has pain with range of motion and decreased dorsiflexion plantarflexion of the right ankle.  Right calf supple nontender.  No rashes skin lesions ulcerations or impending ulcers about the right ankle or foot.  Radiographs: 3 views right ankle shows the talus to be well located within the ankle mortise no diastases.  The lateral malleolus fracture appears to be well-healed.  No other fractures identified.  Impression: 10 weeks status post conservative treatment of right ankle fracture  Plan: We will send patient to physical therapy to work on range of motion the ankle strengthening wean out of ASO brace gait and balance.  Follow with [place] in a month check his progress lack of.  He is asked for some hydrocodone, we will give him a one-time refill on this.

## 2018-01-14 ENCOUNTER — Other Ambulatory Visit (INDEPENDENT_AMBULATORY_CARE_PROVIDER_SITE_OTHER): Payer: Self-pay

## 2018-01-14 DIAGNOSIS — M25571 Pain in right ankle and joints of right foot: Principal | ICD-10-CM

## 2018-01-14 DIAGNOSIS — G8929 Other chronic pain: Secondary | ICD-10-CM

## 2018-01-27 ENCOUNTER — Telehealth (INDEPENDENT_AMBULATORY_CARE_PROVIDER_SITE_OTHER): Payer: Self-pay | Admitting: Orthopaedic Surgery

## 2018-01-27 NOTE — Telephone Encounter (Signed)
Patient called,lmvm. He stated he was supposed to start Physical Therapy, but he hasn't heard anything from anyone and still having ankle pain. His callback [phone]

## 2018-01-27 NOTE — Telephone Encounter (Signed)
Per the referral, it appears they have called patient x 1 to schedule.  I would have patient call them at [phone] to schedule

## 2018-01-27 NOTE — Telephone Encounter (Signed)
Can someone check on this for me?

## 2018-01-28 ENCOUNTER — Telehealth (INDEPENDENT_AMBULATORY_CARE_PROVIDER_SITE_OTHER): Payer: Self-pay

## 2018-01-28 NOTE — Telephone Encounter (Signed)
Patient wants pain Rx refill

## 2018-01-28 NOTE — Telephone Encounter (Signed)
Spoke with patient and he will call PT and schedule with them

## 2018-01-28 NOTE — Telephone Encounter (Signed)
He said he is in INCREDIBLE pain and something needs to be done to help him, "cut the leg off if you have to"

## 2018-01-28 NOTE — Telephone Encounter (Signed)
At this point, we can only prescribe tramadol or tylenol #3, and only for a short time.  It's been three months since his fracture, which was healed in terms of the bone at his last visit.

## 2018-01-29 ENCOUNTER — Other Ambulatory Visit (INDEPENDENT_AMBULATORY_CARE_PROVIDER_SITE_OTHER): Payer: Self-pay

## 2018-01-29 MED ORDER — TRAMADOL HCL 50 MG PO TABS: 50.0000 mg | ORAL_TABLET | Freq: Four times a day (QID) | ORAL | 0 refills | Status: DC | PRN

## 2018-01-29 NOTE — Telephone Encounter (Signed)
Called in Rx for patient.  

## 2018-01-29 NOTE — Telephone Encounter (Signed)
At this point I again, his x-rays showed his fracture had healed.  He certainly can be still experiencing some pain and swelling, however, we will not put him on strong narcotics anymore at this standpoint.  He can only have tramadol or Tylenol 3.  We can also add Neurontin and 800 mg ibuprofen if needed.

## 2018-01-29 NOTE — Telephone Encounter (Signed)
Patient called back and was asking for Dr. Magnus IvanBlackman to PLEASE call in some pain medication for him.  He stated that he is in severe pain.

## 2018-02-10 ENCOUNTER — Telehealth (INDEPENDENT_AMBULATORY_CARE_PROVIDER_SITE_OTHER): Payer: Self-pay | Admitting: Orthopaedic Surgery

## 2018-02-10 ENCOUNTER — Telehealth: Payer: Self-pay | Admitting: Physical Therapy

## 2018-02-10 ENCOUNTER — Ambulatory Visit: Payer: Medicaid Other | Attending: Physical Therapy | Admitting: Physical Therapy

## 2018-02-10 NOTE — Telephone Encounter (Signed)
Tylenol #3 per DR. Blackman's last telephone note.

## 2018-02-10 NOTE — Telephone Encounter (Signed)
Pt no show for PT appointment today. They where contacted and informed of this. They where given number to call front office to reschedule  Ivery QualeBrian Nelson, PT, DPT 02/10/18 4:35 PM

## 2018-02-10 NOTE — Telephone Encounter (Signed)
Please advise 

## 2018-02-10 NOTE — Telephone Encounter (Signed)
Patient called advised the Tramadol is not working. Patient asked if he can get something stronger. Patient said he took 2 and the pain is still bad. The number to contact patient is [phone]

## 2018-02-11 ENCOUNTER — Other Ambulatory Visit (INDEPENDENT_AMBULATORY_CARE_PROVIDER_SITE_OTHER): Payer: Self-pay

## 2018-02-11 ENCOUNTER — Other Ambulatory Visit: Payer: Self-pay | Admitting: Family Medicine

## 2018-02-11 MED ORDER — ACETAMINOPHEN-CODEINE #3 300-30 MG PO TABS: 1.0000 | ORAL_TABLET | Freq: Four times a day (QID) | ORAL | 0 refills | Status: DC | PRN

## 2018-02-11 NOTE — Telephone Encounter (Signed)
Called into pharmacy

## 2018-02-26 ENCOUNTER — Ambulatory Visit: Payer: Medicaid Other | Admitting: Physical Therapy

## 2018-03-20 ENCOUNTER — Other Ambulatory Visit: Payer: Self-pay | Admitting: Family Medicine

## 2018-04-03 ENCOUNTER — Ambulatory Visit: Payer: Medicaid Other | Admitting: Family Medicine

## 2018-04-03 NOTE — Progress Notes (Deleted)
   Subjective   Patient ID: Ernest White    DOB: 1956-01-19, 63 y.o. male   MRN: [id number]  CC: "***"  HPI: Ernest White is a 63 y.o. male who presents to clinic today for the following:  ***: ***  ***Last seen by me in September following fracture of his right metatarsal.  A1c 7.8.  Given flu vaccine.  Avoid changing therapy due to active URI.  Given refill for albuterol and Symbicort for COPD. F/u for pulm nodes?  ROS: see HPI for pertinent.  PMFSH: PMFSH: HTN, COPD, NIDDM, tobacco use disorder, ED.Surgical history appendectomy and hernia. Family history unremarkable. Smoking status reviewed. Medications reviewed.  Objective   There were no vitals taken for this visit. Vitals and nursing note reviewed.  General: well nourished, well developed, NAD with non-toxic appearance HEENT: normocephalic, atraumatic, moist mucous membranes Neck: supple, non-tender without lymphadenopathy Cardiovascular: regular rate and rhythm without murmurs, rubs, or gallops Lungs: clear to auscultation bilaterally with normal work of breathing Abdomen: soft, non-tender, non-distended, normoactive bowel sounds Skin: warm, dry, no rashes or lesions, cap refill < 2 seconds Extremities: warm and well perfused, normal tone, no edema  Assessment & Plan   No problem-specific Assessment & Plan notes found for this encounter.  No orders of the defined types were placed in this encounter.  No orders of the defined types were placed in this encounter.   Durward Parcel, DO [facility], PGY-3 04/03/2018, 8:06 AM

## 2018-04-08 ENCOUNTER — Telehealth: Payer: Self-pay

## 2018-04-08 ENCOUNTER — Ambulatory Visit: Payer: Medicaid Other | Admitting: Family Medicine

## 2018-04-10 ENCOUNTER — Ambulatory Visit: Payer: Medicaid Other | Admitting: Family Medicine

## 2018-04-16 ENCOUNTER — Ambulatory Visit (INDEPENDENT_AMBULATORY_CARE_PROVIDER_SITE_OTHER): Payer: Medicaid Other | Admitting: Family Medicine

## 2018-04-16 ENCOUNTER — Encounter: Payer: Self-pay | Admitting: Family Medicine

## 2018-04-16 VITALS — BP 130/80 | HR 102 | Temp 98.4°F | Wt 298.8 lb

## 2018-04-16 DIAGNOSIS — J441 Chronic obstructive pulmonary disease with (acute) exacerbation: Secondary | ICD-10-CM | POA: Diagnosis not present

## 2018-04-16 DIAGNOSIS — G8929 Other chronic pain: Secondary | ICD-10-CM | POA: Insufficient documentation

## 2018-04-16 DIAGNOSIS — M545 Low back pain, unspecified: Secondary | ICD-10-CM

## 2018-04-16 MED ORDER — CYCLOBENZAPRINE HCL 10 MG PO TABS: 10.0000 mg | ORAL_TABLET | Freq: Three times a day (TID) | ORAL | 0 refills | Status: DC | PRN

## 2018-04-16 NOTE — Patient Instructions (Signed)
Thank you for coming in to see [place] today. Please see below to review our plan for today's visit.  1.  It appears you are doing much better today.  Restart your inhalers as prescribed.  You will not need to complete the medications prescribed you at the hospital discharge. 2.  Your back pain is likely secondary to your weight along with your recent sedentary lifestyle.  It is important that we get you up walking around.  I placed a referral for you to see physical therapy.  They should reach out to you in the next few weeks to schedule the appointment.  I did prescribe you a course of Flexeril which you can take but make sure you do not operate heavy machinery or drive while on this medication as it can cause sedation.  You can also take up to 800 mg of ibuprofen every 8 hours as needed in addition to the Flexeril. 3.  Follow-up with your colonoscopy scheduled on Friday and make sure you have somebody accompanying you so you can have a way to get back to your place.  I will see you again in 3 months or sooner if needed.  Please call the clinic at [phone] if your symptoms worsen or you have any concerns. It was our pleasure to serve you.  Durward Parcel, DO [facility], PGY-3

## 2018-04-16 NOTE — Assessment & Plan Note (Signed)
Acute.  Resolved.  Secondary to viral bronchitis per patient report.  Was seen in an outside facility and hospitalized.  Maintained O2 in the low 90s during ambulation. - ROI to obtain records for recent hospitalization - Instructed to continue inhalers - Reviewed return precautions

## 2018-04-16 NOTE — Assessment & Plan Note (Signed)
Acute on chronic due to recent right ankle fracture.  Patient did not receive PT as instructed and was sedentary throughout duration of recovery.  He is significantly overweight, particularly at the abdomen which is likely contributing.  He has no red flags. - Discussed importance of weight reduction - Given Flexeril prescription and PT referral

## 2018-04-16 NOTE — Progress Notes (Signed)
Subjective   Patient ID: Ernest White    DOB: 1955/07/13, 63 y.o. male   MRN: [id number]  CC: "Hospital follow-up"  HPI: Ernest White is a 63 y.o. male who presents to clinic today for the following:  COPD exacerbation: Patient recently went to see his brother in [place] in January and began experiencing multiple symptoms including productive cough, increased SOB, wheeze and subsequently went to the ED for evaluation.  Patient was admitted for 3 days for acute COPD exacerbation secondary to viral bronchitis but did not complete his azithromycin and prednisone at discharge.  He reports good adherence to his inhalers prior to the exacerbation.  He denies any chest pain and feels that he is better today.  Shortness of breath is now at his baseline.  He continues to have a cough without production.  Chronic low back pain: Patient reports acute worsening of his chronic low back pain without radiation.  Patient states that it affects both the left and right side and was exacerbated after his recent right ankle fracture when he was living a sedentary lifestyle for several weeks.  He is now more active but did not follow-up with PT as instructed.  He is amenable to PT today.  He denies saddle anesthesia or weakness in his lower extremities, or bowel or bladder incontinence.  He has not tried any medication to alleviate his pain.  He is significantly overweight with central adipose tissue.  ROS: see HPI for pertinent.  PMFSH: HTN, COPD, NIDDM, tobacco use disorder, ED.Surgical history appendectomy and hernia. Family history unremarkable. Smoking status reviewed. Medications reviewed.  Objective   BP 130/80   Pulse (!) 102   Temp 98.4 F (36.9 C) (Oral)   Wt 298 lb 12.8 oz (135.5 kg)   SpO2 95%   BMI 41.67 kg/m  Vitals and nursing note reviewed.  General: well nourished, well developed, NAD with non-toxic appearance HEENT: normocephalic, atraumatic, moist mucous  membranes Cardiovascular: regular rate and rhythm without murmurs, rubs, or gallops Lungs: clear to auscultation bilaterally with normal work of breathing Abdomen: obese, soft, non-tender, non-distended, normoactive bowel sounds Skin: warm, dry, no rashes or lesions, cap refill < 2 seconds MSK: moderate paraspinal tenderness bilaterally slightly worse with flexion, 5/5 motor strength in lower extremities bilaterally with intact sensation throughout  Assessment & Plan   COPD exacerbation (HCC) Acute.  Resolved.  Secondary to viral bronchitis per patient report.  Was seen in an outside facility and hospitalized.  Maintained O2 in the low 90s during ambulation. - ROI to obtain records for recent hospitalization - Instructed to continue inhalers - Reviewed return precautions  Chronic low back pain without sciatica Acute on chronic due to recent right ankle fracture.  Patient did not receive PT as instructed and was sedentary throughout duration of recovery.  He is significantly overweight, particularly at the abdomen which is likely contributing.  He has no red flags. - Discussed importance of weight reduction - Given Flexeril prescription and PT referral  Orders Placed This Encounter  Procedures  . Ambulatory referral to Physical Therapy    Referral Priority:   Routine    Referral Type:   Physical Medicine    Referral Reason:   Specialty Services Required    Requested Specialty:   Physical Therapy    Number of Visits Requested:   1   Meds ordered this encounter  Medications  . cyclobenzaprine (FLEXERIL) 10 MG tablet    Sig: Take 1 tablet (10 mg total) by  mouth 3 (three) times daily as needed for muscle spasms.    Dispense:  30 tablet    Refill:  0    Durward Parcelavid Sandria Mcenroe, DO [facility], PGY-3 04/16/2018, 3:51 PM

## 2018-05-03 ENCOUNTER — Other Ambulatory Visit: Payer: Self-pay | Admitting: Family Medicine

## 2018-05-06 ENCOUNTER — Ambulatory Visit: Payer: Medicaid Other | Admitting: Physical Therapy

## 2018-05-15 ENCOUNTER — Ambulatory Visit: Payer: Medicaid Other | Attending: Physical Therapy | Admitting: Physical Therapy

## 2018-05-16 ENCOUNTER — Encounter: Payer: Self-pay | Admitting: Family Medicine

## 2018-05-26 ENCOUNTER — Telehealth: Payer: Self-pay | Admitting: Physical Therapy

## 2018-05-26 NOTE — Telephone Encounter (Signed)
Patient called in to verify appointment time. Caller noticed history of cancellations and no shows and notified me. I spoke with patient and discussed attendance. He explained he has had a lot going on with transportation and deaths in family. He feels that has resolved. I said we could reschedule one more time but I wanted to make sure it was a good time for him because if he missed any further appointments, we would need to refer him back to the MD. He said the MD was "on him" too. We scheduled appointment and verified contact info. I explained he needed to call at least 24 hours in advance if he needed to cancel and shared that we would contact him if anything changed on our end.   Percell Boston, PT, DPT 05/26/18 10:46 AM Phone: [phone] Fax: [phone]

## 2018-06-04 ENCOUNTER — Other Ambulatory Visit: Payer: Self-pay | Admitting: Family Medicine

## 2018-06-06 ENCOUNTER — Ambulatory Visit: Payer: Medicaid Other | Admitting: Physical Therapy

## 2018-06-09 ENCOUNTER — Other Ambulatory Visit: Payer: Self-pay | Admitting: *Deleted

## 2018-06-11 MED ORDER — RIVAROXABAN 20 MG PO TABS: ORAL_TABLET | ORAL | 0 refills | Status: DC

## 2018-06-17 ENCOUNTER — Telehealth (INDEPENDENT_AMBULATORY_CARE_PROVIDER_SITE_OTHER): Payer: Medicaid Other | Admitting: Family Medicine

## 2018-06-17 DIAGNOSIS — M545 Low back pain, unspecified: Secondary | ICD-10-CM

## 2018-06-17 NOTE — Telephone Encounter (Signed)
[facility] Telemedicine Visit  Patient consented to have visit conducted via telephone.  Encounter participants: Patient: Ernest White  Provider: Sandre Kitty  Others (if applicable): none  Chief Complaint: R sided back pain.   HPI: Patient complains of right-sided back pain right above the buttock whenever he breathes in.  This is lasted approximately 1 day.  States is a 5 out of 10 pain.  Has not taking any medications to help improve it.  He has muscle relaxers for leg pain, but has not taken them recently.  Is worse when he lays on his side or sits up.  Patient has not had pain with urination.  He had a colonoscopy 2 weeks ago and has hemorrhoids with consistent bright red blood per rectum.  ROS: Patient denies chest pain, cough, fever, rash, shortness of breath, dysuria.  Pertinent PMHx: COPD  Exam:  Respiratory: no coughing.  Speaking in full sentences without difficulty.    Assessment/Plan:  Right low back pain Patient has right sided low back pain for past day that occurs with inhalation.  States it is a 5/10 and only when he breathes in.  No other signs/symtpoms of respiratory illness.   - likely a musculoskeletal issue.  - advised patient to take tylenol and try his muscle relaxers.   - if pain does not resolve/improve with these medications he may need to get spinal imaging to rule out compression fracture.     Time spent on phone with patient: 15 minutes

## 2018-06-17 NOTE — Assessment & Plan Note (Signed)
Patient has right sided low back pain for past day that occurs with inhalation.  States it is a 5/10 and only when he breathes in.  No other signs/symtpoms of respiratory illness.   - likely a musculoskeletal issue.  - advised patient to take tylenol and try his muscle relaxers.   - if pain does not resolve/improve with these medications he may need to get spinal imaging to rule out compression fracture.

## 2018-07-24 ENCOUNTER — Other Ambulatory Visit: Payer: Self-pay | Admitting: Family Medicine

## 2018-07-24 DIAGNOSIS — E119 Type 2 diabetes mellitus without complications: Secondary | ICD-10-CM

## 2018-08-11 ENCOUNTER — Ambulatory Visit: Payer: Medicaid Other | Attending: Physical Therapy

## 2018-08-11 ENCOUNTER — Other Ambulatory Visit: Payer: Self-pay

## 2018-08-11 DIAGNOSIS — M25652 Stiffness of left hip, not elsewhere classified: Secondary | ICD-10-CM

## 2018-08-11 DIAGNOSIS — M25651 Stiffness of right hip, not elsewhere classified: Secondary | ICD-10-CM | POA: Diagnosis present

## 2018-08-11 DIAGNOSIS — M25671 Stiffness of right ankle, not elsewhere classified: Secondary | ICD-10-CM | POA: Diagnosis present

## 2018-08-11 DIAGNOSIS — G8929 Other chronic pain: Secondary | ICD-10-CM | POA: Diagnosis present

## 2018-08-11 DIAGNOSIS — M25571 Pain in right ankle and joints of right foot: Secondary | ICD-10-CM | POA: Insufficient documentation

## 2018-08-11 DIAGNOSIS — R262 Difficulty in walking, not elsewhere classified: Secondary | ICD-10-CM | POA: Diagnosis present

## 2018-08-11 DIAGNOSIS — M545 Low back pain: Secondary | ICD-10-CM | POA: Insufficient documentation

## 2018-08-11 NOTE — Therapy (Signed)
[facility] [address] [place], [place], [address] Phone: [phone]   Fax:  [phone]  Physical Therapy Evaluation  Patient Details  Name: Ernest GlassDwain L White MRN: [id number] Date of Birth: 06/11/1955 Referring Provider (PT): Pearlean BrownieMarshall Chambliss, MD   Encounter Date: 08/11/2018  PT End of Session - 08/11/18 1442    Visit Number  1    Number of Visits  16    Date for PT Re-Evaluation  10/10/18    Authorization Type  MCD    PT Start Time  0245    PT Stop Time  0330    PT Time Calculation (min)  45 min    Activity Tolerance  Patient tolerated treatment well    Behavior During Therapy  [facility] for tasks assessed/performed       Past Medical History:  Diagnosis Date  . Arthritis   . COPD (chronic obstructive pulmonary disease) (HCC)   . Diabetes mellitus without complication (HCC)   . Gout   . Hypertension     Past Surgical History:  Procedure Laterality Date  . APPENDECTOMY    . HERNIA REPAIR      There were no vitals filed for this visit.   Subjective Assessment - 08/11/18 1458    Subjective  He reports Rt ankle fracture  with pain continuing and with back pain.   He wears ASO.   He reports doing no exercise. He was sedentary since injury.     Limitations  Standing;Walking    How long can you sit comfortably?  As needed    How long can you walk comfortably?  1/4 mile  with pain    Diagnostic tests  xray healed    Patient Stated Goals  To be able to stand and walk with less pain    Currently in Pain?  Yes    Pain Score  5     Pain Location  Ankle    Pain Orientation  Right    Pain Descriptors / Indicators  Aching    Pain Type  Chronic pain    Pain Onset  More than a month ago    Pain Frequency  Constant    Aggravating Factors   weight bearing      Pain Relieving Factors  soak ankle,      Multiple Pain Sites  Yes    Pain Score  0    Pain Location  Back    Pain Orientation  Right;Left;Lower    Pain Descriptors  / Indicators  Aching    Pain Type  Chronic pain    Pain Onset  More than a month ago    Pain Frequency  Intermittent    Aggravating Factors   standing , extended postures    Pain Relieving Factors  sit flexed         [facility] PT Assessment - 08/11/18 0001      Assessment   Medical Diagnosis  RT ankle FX ,chronic pain, LBP    Referring Provider (PT)  Pearlean BrownieMarshall Chambliss, MD    Onset Date/Surgical Date  11/03/17    Prior Therapy  no      Precautions   Precautions  None      Restrictions   Weight Bearing Restrictions  No      Balance Screen   Has the patient fallen in the past 6 months  Yes    How many times?  1    Has the patient had a decrease in activity  level because of a fear of falling?   Yes    Is the patient reluctant to leave their home because of a fear of falling?   No      Prior Function   Level of Independence  Needs assistance with homemaking    Vocation  On disability      Cognition   Overall Cognitive Status  Within Functional Limits for tasks assessed      Posture/Postural Control   Posture Comments  incr lumbar lordosis, RT shoulder lower than LT       ROM / Strength   AROM / PROM / Strength  AROM;PROM;Strength      AROM   AROM Assessment Site  Ankle;Lumbar    Right/Left Ankle  Right;Left    Right Ankle Dorsiflexion  -12    Right Ankle Plantar Flexion  30    Right Ankle Inversion  301    Right Ankle Eversion  10    Left Ankle Dorsiflexion  15    Left Ankle Plantar Flexion  35    Lumbar Flexion  50    Lumbar Extension  20    Lumbar - Right Side Bend  12    Lumbar - Left Side Bend  20    Lumbar - Right Rotation  70%    Lumbar - Left Rotation  70%      PROM   Overall PROM Comments  RT ankle DF 90 degrees                         RT hip adduction 10 degrees   rotation 30 degrees IR/ER.       Strength   Strength Assessment Site  Ankle    Right/Left Ankle  Right;Left    Right Ankle Dorsiflexion  5/5    Right Ankle Plantar Flexion  --   NT    Right Ankle Inversion  5/5    Right Ankle Eversion  5/5    Left Ankle Dorsiflexion  5/5    Left Ankle Plantar Flexion  --   NT   Left Ankle Inversion  5/5    Left Ankle Eversion  5/5      Flexibility   Soft Tissue Assessment /Muscle Length  yes    Hamstrings  60 degrees bilaterally                Objective measurements completed on examination: See above findings.              PT Education - 08/11/18 1551    Education Details  HEP , POC , walking pattern slow to decr limp    Person(s) Educated  Patient    Methods  Explanation;Demonstration;Handout;Verbal cues;Tactile cues    Comprehension  Verbalized understanding;Returned demonstration       PT Short Term Goals - 08/11/18 1545      PT SHORT TERM GOAL #1   Title  hE WILL BE INDEPENDENT WITH INITIAL Hep    Baseline  NO PROGRAM    Time  4    Period  Weeks    Status  New      PT SHORT TERM GOAL #2   Title  He will demo active DF of 10 degrees    Baseline  -10 degrees    Time  4    Period  Weeks    Status  New      PT SHORT TERM GOAL #3   Title  He  will demo better walking pattern slowing walking down and incr DF ROM    Baseline  walks with limp    Time  4    Period  Weeks    Status  New        PT Long Term Goals - 08/11/18 1547      PT LONG TERM GOAL #1   Title  He wll be independent with all hEp issued    Baseline  independent with initial HEP    Time  8    Period  Weeks    Status  New      PT LONG TERM GOAL #2   Title  He will repport pain decr in lower back 50% walking to and from store    Baseline  incr back pain with walking to store    Time  8    Period  Weeks    Status  New      PT LONG TERM GOAL #3   Title  He will report RT ankle pain as intermittant and 3/10 max    Baseline  incr mod to severe ankle pain and constant    Time  8    Period  Weeks    Status  New      PT LONG TERM GOAL #4   Title  He will report no need for ASO with normal walking activity    Baseline   constant AFO use    Time  8    Period  Weeks    Status  New             Plan - 08/11/18 1552    Clinical Impression Statement  [name] presents with chronic RT ankle and LBP with stiffness of RT ankle /both hips and llower back.   He demo incr lumbar lordosis. His ankle stiffness and walking pattern may contribute to his LBP. He should benefit from skilled PT and consistent HEP.     Personal Factors and Comorbidities  Comorbidity 2    Examination-Activity Limitations  Stairs;Locomotion Level    Examination-Participation Restrictions  Community Activity    Stability/Clinical Decision Making  Stable/Uncomplicated    Clinical Decision Making  Low    Rehab Potential  Good    PT Frequency  --   3 visits   PT Duration  2 weeks   then 2x/week for 4-6 weeks   PT Treatment/Interventions  Passive range of motion;Taping;Manual techniques;Therapeutic exercise;Therapeutic activities;Patient/family education;Gait training;Stair training;Moist Heat    PT Next Visit Plan  REview HEp , manual , modalities    PT Home Exercise Plan  PPT, LTR, clam for ROM, DF stretch    Consulted and Agree with Plan of Care  Patient       Patient will benefit from skilled therapeutic intervention in order to improve the following deficits and impairments:  Pain, Postural dysfunction, Increased muscle spasms, Decreased mobility, Decreased activity tolerance, Decreased range of motion, Decreased strength, Difficulty walking, Obesity  Visit Diagnosis: Chronic bilateral low back pain without sciatica  Chronic pain of right ankle  Difficulty in walking, not elsewhere classified  Stiffness of right ankle joint  Stiffness of right hip, not elsewhere classified  Stiffness of left hip, not elsewhere classified     Problem List Patient Active Problem List   Diagnosis Date Noted  . Right low back pain 06/17/2018  . COPD exacerbation (HCC) 04/16/2018  . Chronic low back pain without sciatica 04/16/2018   . Closed non-physeal fracture of  fifth metatarsal bone of right foot 11/25/2017  . Pulmonary nodules/lesions, multiple 09/10/2017  . Pulmonary emboli (HCC) 09/05/2017  . Snoring 04/23/2017  . Erectile dysfunction 08/16/2016  . Frequent No-show for appointment 07/20/2016  . Tobacco use disorder 06/14/2016  . Syncope and collapse 12/27/2015  . Uncontrolled type 2 diabetes mellitus without complication, without long-term current use of insulin (HCC) 12/27/2015  . Chronic obstructive airway disease (HCC) 12/23/2015  . Essential hypertension 12/15/2015    Caprice Red  PT 08/11/2018, 3:56 PM  [facility] [address] [place], [place], [address] Phone: [phone]   Fax:  [phone]  Name: Ernest White MRN: [id number] Date of Birth: 1955/09/06

## 2018-08-11 NOTE — Patient Instructions (Signed)
PPT, LTR, clams,  10 reps 2x/day,       Ankle DF  Stretch 30 sec 3x/day 2-3 reps

## 2018-08-19 ENCOUNTER — Ambulatory Visit: Payer: Medicaid Other

## 2018-08-19 ENCOUNTER — Other Ambulatory Visit: Payer: Self-pay

## 2018-08-19 DIAGNOSIS — R262 Difficulty in walking, not elsewhere classified: Secondary | ICD-10-CM

## 2018-08-19 DIAGNOSIS — M25652 Stiffness of left hip, not elsewhere classified: Secondary | ICD-10-CM

## 2018-08-19 DIAGNOSIS — M25671 Stiffness of right ankle, not elsewhere classified: Secondary | ICD-10-CM

## 2018-08-19 DIAGNOSIS — M545 Low back pain: Secondary | ICD-10-CM | POA: Diagnosis not present

## 2018-08-19 DIAGNOSIS — G8929 Other chronic pain: Secondary | ICD-10-CM

## 2018-08-19 DIAGNOSIS — M25651 Stiffness of right hip, not elsewhere classified: Secondary | ICD-10-CM

## 2018-08-19 NOTE — Patient Instructions (Signed)
Trunk Rotation    Stand with feet shoulder width apart, hands together, knees slightly bent. Rotate shoulders and upper trunk to one side, pelvis held in neutral and facing forward. Return to center and rotate to other side. Repeat sequence _10-15__ times per session. Do _5___ sessions per week.  Copyright  VHI. All rights reserved.  SHOULDER: Extension (Band)    Start with arm slightly forward. Holding band, pull backward, past hip, keeping elbow straight. Do not swing arm. Hold ___ seconds. Use __red_____ band. _10-15__ reps per set,1 ___ sets per day, _5__ days per week  Copyright  VHI. All rights reserved.  Shoulder Row: Sitting    Face anchor. Palms down, pull elbows back, squeezing shoulder blades together. Repeat _10-15_ times per set. Do _1_ sets per session. Do 5__ sessions per week. Anchor Height: Chest  http://tub.exer.us/285   Copyright  VHI. All rights reserved.

## 2018-08-19 NOTE — Therapy (Signed)
[facility] [address] [place], [place], [address] Phone: [phone]   Fax:  [phone]  Physical Therapy Treatment  Patient Details  Name: Ernest GlassDwain L Wise MRN: [id number] Date of Birth: [birth year] Referring Provider (PT): Pearlean BrownieMarshall Chambliss, MD   Encounter Date: 08/19/2018  PT End of Session - 08/19/18 1438    Visit Number  2    Number of Visits  16    Date for PT Re-Evaluation  10/10/18    Authorization Type  MCD    Authorization Time Period  6/4 to 09/03/18    Authorization - Visit Number  1    Authorization - Number of Visits  3    PT Start Time  0250    PT Stop Time  0336    PT Time Calculation (min)  46 min    Activity Tolerance  Patient tolerated treatment well    Behavior During Therapy  [facility] for tasks assessed/performed       Past Medical History:  Diagnosis Date  . Arthritis   . COPD (chronic obstructive pulmonary disease) (HCC)   . Diabetes mellitus without complication (HCC)   . Gout   . Hypertension     Past Surgical History:  Procedure Laterality Date  . APPENDECTOMY    . HERNIA REPAIR      There were no vitals filed for this visit.  Subjective Assessment - 08/19/18 1453    Subjective  Ankle doing well with exercises . no pain today.   Can't sleep.    Back feel pretty good today.   exercise helps.     Currently in Pain?  No/denies    Pain Location  Ankle    Pain Score  0    Pain Location  Back         [facility] PT Assessment - 08/19/18 0001      AROM   Right Ankle Dorsiflexion  10    Right Ankle Plantar Flexion  38                   [facility] Adult PT Treatment/Exercise - 08/19/18 0001      Exercises   Exercises  Knee/Hip;Lumbar      Lumbar Exercises: Stretches   Lower Trunk Rotation Limitations  12 reps RT/LT     Figure 4 Stretch Limitations  only pulled top leg x10 RT /LT      Lumbar Exercises: Standing   Row  Both;15 reps    Theraband Level (Row)  Level 2 (Red)     Shoulder Extension  Both;15 reps    Theraband Level (Shoulder Extension)  Level 2 (Red)    Other Standing Lumbar Exercises  side pulls x 15 RT/Lt with arms partly extended. Cued for minimizing trunk motion      Lumbar Exercises: Supine   Pelvic Tilt  15 reps    Clam  10 reps    Other Supine Lumbar Exercises  marching x 12 RT/LT      Knee/Hip Exercises: Aerobic   Nustep  L3 UE and LE 5 min             PT Education - 08/19/18 1550    Education Details  HEP    Person(s) Educated  Patient    Methods  Explanation;Demonstration;Tactile cues;Verbal cues;Handout    Comprehension  Verbalized understanding;Returned demonstration       PT Short Term Goals - 08/19/18 1546      PT SHORT TERM GOAL #2   Title  He will demo active DF of 10 degrees    Status  Achieved      PT SHORT TERM GOAL #3   Title  He will demo better walking pattern slowing walking down and incr DF ROM    Baseline  no limp today    Status  Achieved        PT Long Term Goals - 08/11/18 1547      PT LONG TERM GOAL #1   Title  He wll be independent with all hEp issued    Baseline  independent with initial HEP    Time  8    Period  Weeks    Status  New      PT LONG TERM GOAL #2   Title  He will repport pain decr in lower back 50% walking to and from store    Baseline  incr back pain with walking to store    Time  8    Period  Weeks    Status  New      PT LONG TERM GOAL #3   Title  He will report RT ankle pain as intermittant and 3/10 max    Baseline  incr mod to severe ankle pain and constant    Time  8    Period  Weeks    Status  New      PT LONG TERM GOAL #4   Title  He will report no need for ASO with normal walking activity    Baseline  constant AFO use    Time  8    Period  Weeks    Status  New            Plan - 08/19/18 1439    Clinical Impression Statement  Mr Mendonsa is doing better with his HEP now with inly intermittant pain. He was pain free at the end of the session . He  also reported he was able to reach his feet in shower with greater ease.  His DF was improved to 10 degrees and he now walks without pain.     PT Treatment/Interventions  Passive range of motion;Taping;Manual techniques;Therapeutic exercise;Therapeutic activities;Patient/family education;Gait training;Stair training;Moist Heat    PT Next Visit Plan  REview HEp , manual , modalities if needed and progress HEP if able    PT Home Exercise Plan  PPT, LTR, clam for ROM, DF stretch, red band rows/shoulder ext and trunk rotation     Consulted and Agree with Plan of Care  Patient       Patient will benefit from skilled therapeutic intervention in order to improve the following deficits and impairments:  Pain, Postural dysfunction, Increased muscle spasms, Decreased mobility, Decreased activity tolerance, Decreased range of motion, Decreased strength, Difficulty walking, Obesity  Visit Diagnosis: Chronic bilateral low back pain without sciatica  Chronic pain of right ankle  Difficulty in walking, not elsewhere classified  Stiffness of right ankle joint  Stiffness of right hip, not elsewhere classified  Stiffness of left hip, not elsewhere classified     Problem List Patient Active Problem List   Diagnosis Date Noted  . Right low back pain 06/17/2018  . COPD exacerbation ([place]) 04/16/2018  . Chronic low back pain without sciatica 04/16/2018  . Closed non-physeal fracture of fifth metatarsal bone of right foot 11/25/2017  . Pulmonary nodules/lesions, multiple 09/10/2017  . Pulmonary emboli ([place]) 09/05/2017  . Snoring 04/23/2017  . Erectile dysfunction 08/16/2016  . Frequent No-show for appointment 07/20/2016  .  Tobacco use disorder 06/14/2016  . Syncope and collapse 12/27/2015  . Uncontrolled type 2 diabetes mellitus without complication, without long-term current use of insulin (HCC) 12/27/2015  . Chronic obstructive airway disease (HCC) 12/23/2015  . Essential hypertension 12/15/2015     Caprice RedChasse, Pearlina Friedly M  PT 08/19/2018, 3:56 PM  [facility] [address] [place], [place], [address] Phone: [phone]   Fax:  [phone]  Name: Ernest GlassDwain L Sedita MRN: [id number] Date of Birth: May 13, 1955

## 2018-08-22 ENCOUNTER — Other Ambulatory Visit: Payer: Self-pay

## 2018-08-22 ENCOUNTER — Ambulatory Visit: Payer: Medicaid Other

## 2018-08-22 DIAGNOSIS — G8929 Other chronic pain: Secondary | ICD-10-CM

## 2018-08-22 DIAGNOSIS — R262 Difficulty in walking, not elsewhere classified: Secondary | ICD-10-CM

## 2018-08-22 DIAGNOSIS — M545 Low back pain: Secondary | ICD-10-CM | POA: Diagnosis not present

## 2018-08-22 NOTE — Therapy (Addendum)
[place], [place], [address] Phone: [phone]   Fax:  [phone]  Physical Therapy Treatment/Discharge  Patient Details  Name: Ernest White MRN: [id number] Date of Birth: 23-Aug-1955 Referring Provider (PT): Talbert Cage, MD   Encounter Date: 08/22/2018    Past Medical History:  Diagnosis Date  . Arthritis   . COPD (chronic obstructive pulmonary disease) ([place])   . Diabetes mellitus without complication ([place])   . Gout   . Hypertension     Past Surgical History:  Procedure Laterality Date  . APPENDECTOMY    . HERNIA REPAIR      There were no vitals filed for this visit.                              PT Short Term Goals - 08/22/18 1203      PT SHORT TERM GOAL #1   Title  hE WILL BE INDEPENDENT WITH INITIAL Hep    Status  Achieved      PT SHORT TERM GOAL #2   Title  He will demo active DF of 10 degrees    Status  Achieved      PT SHORT TERM GOAL #3   Title  He will demo better walking pattern slowing walking down and incr DF ROM    Status  Achieved        PT Long Term Goals - 08/22/18 1203      PT LONG TERM GOAL #1   Title  He wll be independent with all hEp issued    Status  On-going      PT LONG TERM GOAL #2   Title  He will report pain decr in lower back 50% walking to and from store    Baseline  No pain    Status  Achieved      PT LONG TERM GOAL #3   Title  He will report RT ankle pain as intermittant and 3/10 max    Baseline  no pain 80% of time    Status  Achieved      PT LONG TERM GOAL #4   Title  He will report no need for ASO with normal walking activity    Baseline  uses off and on    Status  On-going      PT LONG TERM GOAL #5   Title  He will be able to do dishes without pain.    Baseline  pain standing doing dishes    Time  3    Period  Weeks    Status  New              Patient will benefit from skilled  therapeutic intervention in order to improve the following deficits and impairments:  Pain, Postural dysfunction, Increased muscle spasms, Decreased mobility, Decreased activity tolerance, Decreased range of motion, Decreased strength, Difficulty walking, Obesity  Visit Diagnosis: Chronic pain of right ankle - Plan:   Chronic bilateral low back pain without sciatica - Plan:  Difficulty in walking, not elsewhere classified - Plan:      Problem List Patient Active Problem List   Diagnosis Date Noted  . Right low back pain 06/17/2018  . COPD exacerbation ([place]) 04/16/2018  . Chronic low back pain without sciatica 04/16/2018  . Closed non-physeal fracture of fifth metatarsal bone of right foot 11/25/2017  . Pulmonary nodules/lesions, multiple 09/10/2017  . Pulmonary emboli ([place])  09/05/2017  . Snoring 04/23/2017  . Erectile dysfunction 08/16/2016  . Frequent No-show for appointment 07/20/2016  . Tobacco use disorder 06/14/2016  . Syncope and collapse 12/27/2015  . Uncontrolled type 2 diabetes mellitus without complication, without long-term current use of insulin ([place]) 12/27/2015  . Chronic obstructive airway disease ([place]) 12/23/2015  . Essential hypertension 12/15/2015    Darrel Hoover PT 09/08/2018, 4:53 PM  [facility] [address] [place], [place], [address] Phone: [phone]   Fax:  [phone]  Name: Ernest White MRN: [id number] Date of Birth: 1955-04-16  PHYSICAL THERAPY DISCHARGE SUMMARY  Visits from Start of Care: 3 Current functional level related to goals / functional outcomes: See above . I spoke to Ernest White today and he continues to manage his pain well and is walking without problems doing his HEP   Remaining deficits: See above   Education / Equipment: HEP Plan: Patient agrees to discharge.  Patient goals were met. Patient is being discharged due to meeting the stated rehab goals.  ?????     Pearson Forster PT 09/08/18

## 2018-08-26 ENCOUNTER — Ambulatory Visit: Payer: Medicaid Other

## 2018-09-01 ENCOUNTER — Ambulatory Visit: Payer: Medicaid Other | Admitting: Physician Assistant

## 2018-09-03 ENCOUNTER — Ambulatory Visit: Payer: Medicaid Other | Admitting: Physician Assistant

## 2018-09-08 ENCOUNTER — Telehealth: Payer: Self-pay | Admitting: Physical Therapy

## 2018-09-08 ENCOUNTER — Ambulatory Visit: Payer: Medicaid Other

## 2018-09-08 NOTE — Progress Notes (Deleted)
   Subjective:    Patient ID: Ernest White, male    DOB: Jul 29, 1955, 63 y.o.   MRN: [id number]   CC: back pain   HPI: Back pain  Recently seen by PCP on 04/16/2018 for chronic low back pain.  At that time patient was recommended to start physical therapy, weight loss, and Flexeril.  Telephone follow-up visit with Dr. Jeannine Kitten on 06/17/2018 stating that patient should use Tylenol and continue muscle relaxers.  At that time recommended that patient may need spinal imaging to rule out compression fracture if not improved with medications.  There have been several physical therapy visits since then.  Smoking status reviewed  Review of Systems   Objective:  There were no vitals taken for this visit. Vitals and nursing note reviewed  General: well nourished, in no acute distress HEENT: normocephalic, TM's visualized bilaterally, no scleral icterus or conjunctival pallor, no nasal discharge, moist mucous membranes, good dentition without erythema or discharge noted in posterior oropharynx Neck: supple, non-tender, without lymphadenopathy Cardiac: RRR, clear S1 and S2, no murmurs, rubs, or gallops Respiratory: clear to auscultation bilaterally, no increased work of breathing Abdomen: soft, nontender, nondistended, no masses or organomegaly. Bowel sounds present Extremities: no edema or cyanosis. Warm, well perfused. 2+ radial and PT pulses bilaterally Skin: warm and dry, no rashes noted Neuro: alert and oriented, no focal deficits   Assessment & Plan:    No problem-specific Assessment & Plan notes found for this encounter.    No follow-ups on file.   Caroline More, DO, PGY-2

## 2018-09-08 NOTE — Telephone Encounter (Signed)
I spoke to Mr Ernest White and he said he forgot today's appointment. He stated he was still doing well and he agreed to discharge today . He was informed he could return to PT if needed in future.

## 2018-09-09 ENCOUNTER — Ambulatory Visit: Payer: Medicaid Other | Admitting: Family Medicine

## 2018-09-18 ENCOUNTER — Other Ambulatory Visit: Payer: Self-pay

## 2018-09-18 MED ORDER — RIVAROXABAN 20 MG PO TABS: ORAL_TABLET | ORAL | 0 refills | Status: DC

## 2018-10-14 ENCOUNTER — Other Ambulatory Visit: Payer: Self-pay

## 2018-10-14 ENCOUNTER — Ambulatory Visit (INDEPENDENT_AMBULATORY_CARE_PROVIDER_SITE_OTHER): Payer: Medicaid Other | Admitting: Family Medicine

## 2018-10-14 VITALS — BP 140/80 | HR 103

## 2018-10-14 DIAGNOSIS — E1165 Type 2 diabetes mellitus with hyperglycemia: Secondary | ICD-10-CM | POA: Diagnosis not present

## 2018-10-14 DIAGNOSIS — IMO0001 Reserved for inherently not codable concepts without codable children: Secondary | ICD-10-CM

## 2018-10-14 DIAGNOSIS — N481 Balanitis: Secondary | ICD-10-CM

## 2018-10-14 DIAGNOSIS — N529 Male erectile dysfunction, unspecified: Secondary | ICD-10-CM | POA: Diagnosis not present

## 2018-10-14 DIAGNOSIS — E0865 Diabetes mellitus due to underlying condition with hyperglycemia: Secondary | ICD-10-CM

## 2018-10-14 LAB — POCT GLYCOSYLATED HEMOGLOBIN (HGB A1C): HbA1c, POC (controlled diabetic range): 12.3 % — AB (ref 0.0–7.0)

## 2018-10-14 MED ORDER — CLOTRIMAZOLE 1 % EX CREA: 1.0000 "application " | TOPICAL_CREAM | Freq: Two times a day (BID) | CUTANEOUS | 0 refills | Status: AC

## 2018-10-14 MED ORDER — SILDENAFIL CITRATE 20 MG PO TABS: 20.0000 mg | ORAL_TABLET | Freq: Three times a day (TID) | ORAL | 3 refills | Status: DC

## 2018-10-14 NOTE — Patient Instructions (Signed)
Balanitis  Balanitis is swelling and irritation (inflammation) of the head of the penis (glans penis). The condition may also cause inflammation of the skin around the glans penis (foreskin) in men who have not been circumcised. It may develop because of an infection or another medical condition. Balanitis occurs most often among men who have not had their foreskin removed (uncircumcised men). Balanitis sometimes causes scarring of the penis or foreskin, which can require surgery. Untreated balanitis can increase the risk of penile cancer. What are the causes? Common causes of this condition include:  Poor personal hygiene, especially in uncircumcised men. Not cleaning the glans penis and foreskin well can result in buildup of bacteria, viruses, and yeast, which can lead to infection and inflammation.  Irritation and lack of air flow due to fluid (smegma) that can build up on the glans penis. Other causes include:  Chemical irritation from products such as soaps or shower gels (especially those that have fragrance), condoms, personal lubricants, petroleum jelly, spermicides, or fabric softeners.  Skin conditions, such as eczema, dermatitis, and psoriasis.  Allergies to medicines, such as tetracycline and sulfa drugs.  Certain medical conditions, including liver cirrhosis, congestive heart failure, diabetes, and kidney disease.  Infections, such as candidiasis, HPV (human papillomavirus), herpes simplex, gonorrhea, and syphilis.  Severe obesity. What increases the risk? The following factors may make you more likely to develop this condition:  Having diabetes. This is the most common risk factor.  Having a tight foreskin that is difficult to pull back (retract) past the glans.  Having sexual intercourse without using a condom. What are the signs or symptoms? Symptoms of this condition include:  Discharge from under the foreskin.  A bad smell.  Pain or difficulty retracting the  foreskin.  Tenderness, redness, and swelling of the glans.  A rash or sores on the glans or foreskin.  Itchiness.  Inability to get an erection due to pain.  Difficulty urinating.  Scarring of the penis or foreskin, in some cases. How is this diagnosed? This condition may be diagnosed based on:  A physical exam.  Testing a swab of discharge to check for bacterial or fungal infection.  Blood tests: ? To check for viruses that can cause balanitis. ? To check your blood sugar (glucose) level. High blood glucose could be a sign of diabetes, which can cause balanitis. How is this treated? Treatment for balanitis depends on the cause. Treatment may include:  Improving personal hygiene. Your health care provider may recommend sitting in a bath of warm water that is deep enough to cover your hips and buttocks (sitz bath).  Medicines such as: ? Creams or ointments to reduce swelling (steroids) or to treat an infection. ? Antibiotic medicine. ? Antifungal medicine.  Surgery to remove or cut the foreskin (circumcision). This may be done if you have scarring on the foreskin that makes it difficult to retract.  Controlling other medical problems that may be causing your condition or making it worse. Follow these instructions at home:  Do not have sex until the condition clears up, or until your health care provider approves.  Keep your penis clean and dry. Take sitz baths as recommended by your health care provider.  Avoid products that irritate your skin or make symptoms worse, such as soaps and shower gels that have fragrance.  Take over-the-counter and prescription medicines only as told by your health care provider. ? If you were prescribed an antibiotic medicine or a cream or ointment, use it as   told by your health care provider. Do not stop using your medicine, cream, or ointment even if you start to feel better. ? Do not drive or use heavy machinery while taking prescription  pain medicine. Contact a health care provider if:  Your symptoms get worse or do not improve with home care.  You develop chills or a fever.  You have trouble urinating.  You cannot retract your foreskin. Get help right away if:  You develop severe pain.  You are unable to urinate. Summary  Balanitis is inflammation of the head of the penis (glans penis) caused by irritation or infection.  Balanitis causes pain, redness, and swelling of the glans penis.  This condition is most common among uncircumcised men who do not keep their glans penis clean and in men who have diabetes.  Treatment may include creams or ointments.  Good hygiene is important for prevention. This includes pulling back the foreskin when washing your penis. This information is not intended to replace advice given to you by your health care provider. Make sure you discuss any questions you have with your health care provider. Document Released: 07/15/2008 Document Revised: 02/08/2017 Document Reviewed: 01/16/2016 Elsevier Patient Education  2020 [employer]  

## 2018-10-14 NOTE — Telephone Encounter (Signed)
Patient calling nurse line asking for a refill on Sildenafil. I informed him it was refilled at his visit today.

## 2018-10-14 NOTE — Progress Notes (Signed)
   Subjective:    Patient ID: Ernest White, male    DOB: Feb 01, 1956, 63 y.o.   MRN: [id number]   CC: rash on penis  HPI: Rash Patient presenting with rash on penis.  States that he thinks it is a yeast infection.  Started 1 week ago.  States his penis is swollen.  Does report pain.  States he is peeing a lot but that is because he drinks a lot of water and this is not new.  Denies any blood.  Denies any dysuria.  Does not burn only if he does not wipe appropriately.  States that is the shaft of his penis it hurts.  Denies any penile discharge.  Does say there is "white stuff" on his penis.  Denies any odor.  Has tried cocoa butter but does not help with the pain.  Patient is uncircumcised.  Is currently sexually active but with one partner.  Does not think he has been exposed to any STDs.   Objective:  BP 140/80   Pulse (!) 103   SpO2 95%  Vitals and nursing note reviewed  General: well nourished, in no acute distress HEENT: normocephalic, no scleral icterus or conjunctival pallor  Cardiac: Regular rate Respiratory: no increased work of breathing Extremities: no edema or cyanosis. Warm, well perfused.   Skin: warm and dry, no rashes noted Neuro: alert and oriented, no focal deficits Male genitalia: no penile lesions or discharge Penis: uncircumcised and erythema Testicles: normal, no masses Scrotum: normal  Assessment & Plan:    Balanitis Physical exam and clinical presentation consistent with balantitis 2/2 yeast infection. Poor diabetic control likely contributing. Stressed importance of tight glycemic control. Will prescribe clotrimazole cream to be used x7 days. If no improvement follow up in 1-2 weeks, sooner if worsening. Strict return precautions given.   Uncontrolled type 2 diabetes mellitus without complication, without long-term current use of insulin (HCC) A1C elevated today to 12.3. Advised to follow up ASAP for diabetes. Plans to follow up this upcoming Monday to  discuss diabetes. Stressed importance of healthy diet and daily exercise.     Return in about 2 weeks (around 10/28/2018), or if symptoms worsen or fail to improve.   Caroline More, DO, PGY-3

## 2018-10-14 NOTE — Assessment & Plan Note (Signed)
Physical exam and clinical presentation consistent with balantitis 2/2 yeast infection. Poor diabetic control likely contributing. Stressed importance of tight glycemic control. Will prescribe clotrimazole cream to be used x7 days. If no improvement follow up in 1-2 weeks, sooner if worsening. Strict return precautions given.

## 2018-10-14 NOTE — Assessment & Plan Note (Signed)
A1C elevated today to 12.3. Advised to follow up ASAP for diabetes. Plans to follow up this upcoming Monday to discuss diabetes. Stressed importance of healthy diet and daily exercise.

## 2018-10-17 NOTE — Progress Notes (Signed)
Subjective:    Patient ID: Ernest White, male    DOB: 26-Feb-1956, 63 y.o.   MRN: [id number]   CC: diabetes f/u   HPI: Diabetes Fasting checks: glucometer is broken  Post prandial glucometer is broken   Compliance: good  Diet: eats lots of bread (with everything he eats), lots of fried foods. No rice. No beans. Eats vegetables.  Exercise: none  Eye exam: none  Foot exam: UTD 11/2017 A1C: 12.3 Symptoms: some symptoms of hypoglycemia. some symptoms of  polyuria, polydipsia. no numbness in extremities, and one foot ulcers/trauma Meds: metformin 500mg  bid Urine micro albumin:creatine ratio: on ACEI  Monitoring Labs and Parameters Last A1C:  Lab Results  Component Value Date   HGBA1C 12.3 (A) 10/14/2018   Last Lipid:     Component Value Date/Time   CHOL 147 09/06/2017 0356   HDL 45 09/06/2017 0356   Last Bmet  Potassium  Date Value Ref Range Status  09/06/2017 3.4 (L) 3.5 - 5.1 mmol/L Final   Sodium  Date Value Ref Range Status  09/06/2017 136 135 - 145 mmol/L Final  03/20/2017 139 134 - 144 mmol/L Final   Creat  Date Value Ref Range Status  12/15/2015 1.14 0.70 - 1.25 mg/dL Final    Comment:      For patients > or = 64 years of age: The upper reference limit for Creatinine is approximately 13% higher for people identified as African-American.      Creatinine, Ser  Date Value Ref Range Status  09/06/2017 1.12 0.61 - 1.24 mg/dL Final      GERD Patient with symptoms he believes is were acid reflux.  States he burps a lot.  Worse at nighttime.  Worse when he eats certain foods such as fried foods.  States he would like some medication to help with the symptoms.  Objective:  BP 125/80   Pulse 100   Temp 98.2 F (36.8 C) (Oral)   Wt 295 lb (133.8 kg)   SpO2 90%   BMI 41.14 kg/m  Vitals and nursing note reviewed  General: well nourished, in no acute distress HEENT: normocephalic, PERRL, no scleral icterus or conjunctival pallor  Neck: supple,  non-tender, without lymphadenopathy Cardiac: RRR, clear S1 and S2, no murmurs, rubs, or gallops Respiratory: clear to auscultation bilaterally, no increased work of breathing Abdomen: soft, nontender, nondistended, no masses or organomegaly. Bowel sounds present Extremities: no edema or cyanosis. Warm, well perfused.  Skin: warm and dry, no rashes noted Neuro: alert and oriented, no focal deficits Foot: small ulcers that are healing well on left toes   Assessment & Plan:    Uncontrolled type 2 diabetes mellitus without complication, without long-term current use of insulin (HCC) Poorly controlled with A1c elevated to 12.3.  I anticipate he will need increase in medication dose especially given his poor diet.  Is only on low-dose 500 mg twice daily metformin.  We will increase this to metformin 1000 mg twice daily.  We will also add Jardiance 10 mg.  I anticipate he will need a higher dose but will start low and increase as tolerated.  Recommended insulin or other injectable such as a GLP-1 however patient is refusing due to fear of needles.  Have refilled patient's glucometer as his old one is broken.  Advised patient to check his blood sugars as fasting and throughout the day as for postprandial as well.  Discussed in detail about healthy diet.  Will refer to nutrition.  Advised patient to  exercise daily.  Advised I have slowly increasing with 1 flight of stairs, and then the second week increase to 2 flight of stairs, third week increase to 3 flights of stairs, etc.  Advised patient not to overexert himself.  Strict return precautions given.  We will follow-up in 1 month.  Have placed referral for ophthalmology for diabetic eye exam.  Patient also mentions sometimes having blurry. Have also placed referral to podiatry for foot ulcers and diabetic foot exam.   vision.  Patient may benefit from seeing Dr. Raymondo BandKoval in pharmacy clinic for diabetes.  GERD (gastroesophageal reflux disease) Rx for  ranitidine.  We will follow-up in 1 month.    Return in about 4 weeks (around 11/17/2018).   Oralia ManisSherin Isak Sotomayor, DO, PGY-3

## 2018-10-20 ENCOUNTER — Ambulatory Visit (INDEPENDENT_AMBULATORY_CARE_PROVIDER_SITE_OTHER): Payer: Medicaid Other | Admitting: Family Medicine

## 2018-10-20 ENCOUNTER — Other Ambulatory Visit: Payer: Self-pay

## 2018-10-20 ENCOUNTER — Encounter: Payer: Self-pay | Admitting: Family Medicine

## 2018-10-20 VITALS — BP 125/80 | HR 100 | Temp 98.2°F | Wt 295.0 lb

## 2018-10-20 DIAGNOSIS — H538 Other visual disturbances: Secondary | ICD-10-CM

## 2018-10-20 DIAGNOSIS — E1165 Type 2 diabetes mellitus with hyperglycemia: Secondary | ICD-10-CM | POA: Diagnosis not present

## 2018-10-20 DIAGNOSIS — Z1211 Encounter for screening for malignant neoplasm of colon: Secondary | ICD-10-CM

## 2018-10-20 DIAGNOSIS — K219 Gastro-esophageal reflux disease without esophagitis: Secondary | ICD-10-CM | POA: Diagnosis not present

## 2018-10-20 DIAGNOSIS — L97521 Non-pressure chronic ulcer of other part of left foot limited to breakdown of skin: Secondary | ICD-10-CM | POA: Diagnosis not present

## 2018-10-20 DIAGNOSIS — E08621 Diabetes mellitus due to underlying condition with foot ulcer: Secondary | ICD-10-CM

## 2018-10-20 DIAGNOSIS — E119 Type 2 diabetes mellitus without complications: Secondary | ICD-10-CM

## 2018-10-20 DIAGNOSIS — IMO0001 Reserved for inherently not codable concepts without codable children: Secondary | ICD-10-CM

## 2018-10-20 MED ORDER — METFORMIN HCL 1000 MG PO TABS: 1000.0000 mg | ORAL_TABLET | Freq: Two times a day (BID) | ORAL | 1 refills | Status: DC

## 2018-10-20 MED ORDER — EMPAGLIFLOZIN 10 MG PO TABS: 10.0000 mg | ORAL_TABLET | Freq: Every day | ORAL | 3 refills | Status: DC

## 2018-10-20 MED ORDER — ACCU-CHEK AVIVA PLUS VI STRP: ORAL_STRIP | 12 refills | Status: DC

## 2018-10-20 MED ORDER — RANITIDINE HCL 150 MG PO CAPS: 150.0000 mg | ORAL_CAPSULE | Freq: Two times a day (BID) | ORAL | 1 refills | Status: DC

## 2018-10-20 MED ORDER — BAYER MICROLET LANCETS MISC: 12 refills | Status: DC

## 2018-10-20 MED ORDER — ACCU-CHEK AVIVA PLUS W/DEVICE KIT: PACK | 0 refills | Status: DC

## 2018-10-20 NOTE — Patient Instructions (Signed)
  Diet Recommendations for Diabetes   Starchy (carb) foods: Bread, rice, pasta, potatoes, corn, cereal, grits, crackers, bagels, muffins, all baked goods.  (Fruits, milk, and yogurt also have carbohydrate, but most of these foods will not spike your blood sugar as the starchy foods will.)  A few fruits do cause high blood sugars; use small portions of bananas (limit to 1/2 at a time), grapes, watermelon, oranges, and most tropical fruits.    Protein foods: Meat, fish, poultry, eggs, dairy foods, and beans such as pinto and kidney beans (beans also provide carbohydrate).   1. Eat at least 3 meals and 1-2 snacks per day. Never go more than 4-5 hours while awake without eating. Eat breakfast within the first hour of getting up.   2. Limit starchy foods to TWO per meal and ONE per snack. ONE portion of a starchy  food is equal to the following:   - ONE slice of bread (or its equivalent, such as half of a hamburger bun).   - 1/2 cup of a "scoopable" starchy food such as potatoes or rice.   - 15 grams of carbohydrate as shown on food label.  3. Include at every meal: a protein food, a carb food, and vegetables and/or fruit.   - Obtain twice the volume of veg's as protein or carbohydrate foods for both lunch and dinner.   - Fresh or frozen veg's are best.   - Keep frozen veg's on hand for a quick vegetable serving.        For your diabetes: increase exercise.  Start by walking 1 flight of stairs daily.  Increase by 1 flight every week.  Please take this slowly and do not overexert yourself.  I have started you on a new medication called Jardiance.  Please follow-up in 1 month so we can ensure your diabetes is under control.  Please check your sugars daily.  If your sugars at become less than 150 please call as we may need to decrease her medications at that point.  Please follow-up with an eye doctor as soon as you can as well as a foot doctor.  Please also follow-up with nutrition and work on decreasing  carbohydrates in your meals.  Follow up in 1 month  If you have shortness of breath, chest pain, changes in vision please go to the emergency department.  Dr. Tammi Klippel

## 2018-10-20 NOTE — Assessment & Plan Note (Addendum)
Poorly controlled with A1c elevated to 12.3.  I anticipate he will need increase in medication dose especially given his poor diet.  Is only on low-dose 500 mg twice daily metformin.  We will increase this to metformin 1000 mg twice daily.  We will also add Jardiance 10 mg.  I anticipate he will need a higher dose but will start low and increase as tolerated.  Recommended insulin or other injectable such as a GLP-1 however patient is refusing due to fear of needles.  Have refilled patient's glucometer as his old one is broken.  Advised patient to check his blood sugars as fasting and throughout the day as for postprandial as well.  Discussed in detail about healthy diet.  Will refer to nutrition.  Advised patient to exercise daily.  Advised I have slowly increasing with 1 flight of stairs, and then the second week increase to 2 flight of stairs, third week increase to 3 flights of stairs, etc.  Advised patient not to overexert himself.  Strict return precautions given.  We will follow-up in 1 month.  Have placed referral for ophthalmology for diabetic eye exam.  Patient also mentions sometimes having blurry. Have also placed referral to podiatry for foot ulcers and diabetic foot exam.   vision.  Patient may benefit from seeing Dr. Valentina Lucks in pharmacy clinic for diabetes.

## 2018-10-20 NOTE — Assessment & Plan Note (Signed)
Rx for ranitidine.  We will follow-up in 1 month.

## 2018-10-21 ENCOUNTER — Telehealth: Payer: Self-pay

## 2018-10-21 LAB — BASIC METABOLIC PANEL
BUN/Creatinine Ratio: 14 (ref 10–24)
BUN: 15 mg/dL (ref 8–27)
CO2: 27 mmol/L (ref 20–29)
Calcium: 9.6 mg/dL (ref 8.6–10.2)
Chloride: 93 mmol/L — ABNORMAL LOW (ref 96–106)
Creatinine, Ser: 1.11 mg/dL (ref 0.76–1.27)
GFR calc Af Amer: 81 mL/min/{1.73_m2} (ref >59)
GFR calc non Af Amer: 70 mL/min/{1.73_m2} (ref >59)
Glucose: 364 mg/dL — ABNORMAL HIGH (ref 65–99)
Potassium: 3.6 mmol/L (ref 3.5–5.2)
Sodium: 139 mmol/L (ref 134–144)

## 2018-10-21 NOTE — Telephone Encounter (Signed)
Informed patient of results.  .Ernest White R Lunell Robart, CMA  

## 2018-10-31 ENCOUNTER — Ambulatory Visit (INDEPENDENT_AMBULATORY_CARE_PROVIDER_SITE_OTHER): Payer: Medicaid Other | Admitting: Podiatry

## 2018-10-31 DIAGNOSIS — Z5329 Procedure and treatment not carried out because of patient's decision for other reasons: Secondary | ICD-10-CM

## 2018-10-31 NOTE — Progress Notes (Signed)
   Complete physical exam  Patient: Ernest White   DOB: 12/30/1999   63 y.o. Male  MRN: [id number]  Subjective:    No chief complaint on file.   Ernest White is a 63 y.o. male who presents today for a complete physical exam. She reports consuming a {diet types:17450} diet. {types:19826} She generally feels {DESC; WELL/FAIRLY WELL/POORLY:18703}. She reports sleeping {DESC; WELL/FAIRLY WELL/POORLY:18703}. She {does/does not:200015} have additional problems to discuss today.    Most recent fall risk assessment:    09/06/2021   10:42 AM  Fall Risk   Falls in the past year? 0  Number falls in past yr: 0  Injury with Fall? 0  Risk for fall due to : No Fall Risks  Follow up Falls evaluation completed     Most recent depression screenings:    09/06/2021   10:42 AM 07/28/2020   10:46 AM  PHQ 2/9 Scores  PHQ - 2 Score 0 0  PHQ- 9 Score 5     {VISON DENTAL STD PSA (Optional):27386}  {History (Optional):23778}  Patient Care Team: Jessup, Joy, NP as PCP - General (Nurse Practitioner)   Outpatient Medications Prior to Visit  Medication Sig   fluticasone (FLONASE) 50 MCG/ACT nasal spray Place 2 sprays into both nostrils in the morning and at bedtime. After 7 days, reduce to once daily.   norgestimate-ethinyl estradiol (SPRINTEC 28) 0.25-35 MG-MCG tablet Take 1 tablet by mouth daily.   Nystatin POWD Apply liberally to affected area 2 times per day   spironolactone (ALDACTONE) 100 MG tablet Take 1 tablet (100 mg total) by mouth daily.   No facility-administered medications prior to visit.    ROS        Objective:     There were no vitals taken for this visit. {Vitals History (Optional):23777}  Physical Exam   No results found for any visits on 10/12/21. {Show previous labs (optional):23779}    Assessment & Plan:    Routine Health Maintenance and Physical Exam  Immunization History  Administered Date(s) Administered   DTaP 03/15/1999, 05/11/1999,  07/20/1999, 04/04/2000, 10/19/2003   Hepatitis A 08/15/2007, 08/20/2008   Hepatitis B 12/31/1998, 02/07/1999, 07/20/1999   HiB (PRP-OMP) 03/15/1999, 05/11/1999, 07/20/1999, 04/04/2000   IPV 03/15/1999, 05/11/1999, 01/08/2000, 10/19/2003   Influenza,inj,Quad PF,6+ Mos 11/20/2013   Influenza-Unspecified 02/20/2012   MMR 01/07/2001, 10/19/2003   Meningococcal Polysaccharide 08/20/2011   Pneumococcal Conjugate-13 04/04/2000   Pneumococcal-Unspecified 07/20/1999, 10/03/1999   Tdap 08/20/2011   Varicella 01/08/2000, 08/15/2007    Health Maintenance  Topic Date Due   HIV Screening  Never done   Hepatitis C Screening  Never done   INFLUENZA VACCINE  10/10/2021   PAP-Cervical Cytology Screening  10/12/2021 (Originally 12/30/2019)   PAP SMEAR-Modifier  10/12/2021 (Originally 12/30/2019)   TETANUS/TDAP  10/12/2021 (Originally 08/19/2021)   HPV VACCINES  Discontinued   COVID-19 Vaccine  Discontinued    Discussed health benefits of physical activity, and encouraged her to engage in regular exercise appropriate for her age and condition.  Problem List Items Addressed This Visit   None Visit Diagnoses     Annual physical exam    -  Primary   Cervical cancer screening       Need for Tdap vaccination          No follow-ups on file.     Joy Jessup, NP   

## 2018-11-13 ENCOUNTER — Ambulatory Visit: Payer: Medicaid Other | Admitting: Registered"

## 2018-11-13 IMAGING — DX DG CHEST 2V
2 series · 2 of 2 positions shown · non-contrast
Comparison: None.

CLINICAL DATA: Cough, shortness of breath, weakness, and fatigue
for 2 weeks. History of hypertension, diabetes, COPD, smoker.

EXAM:
CHEST  2 VIEW

[chest lat]
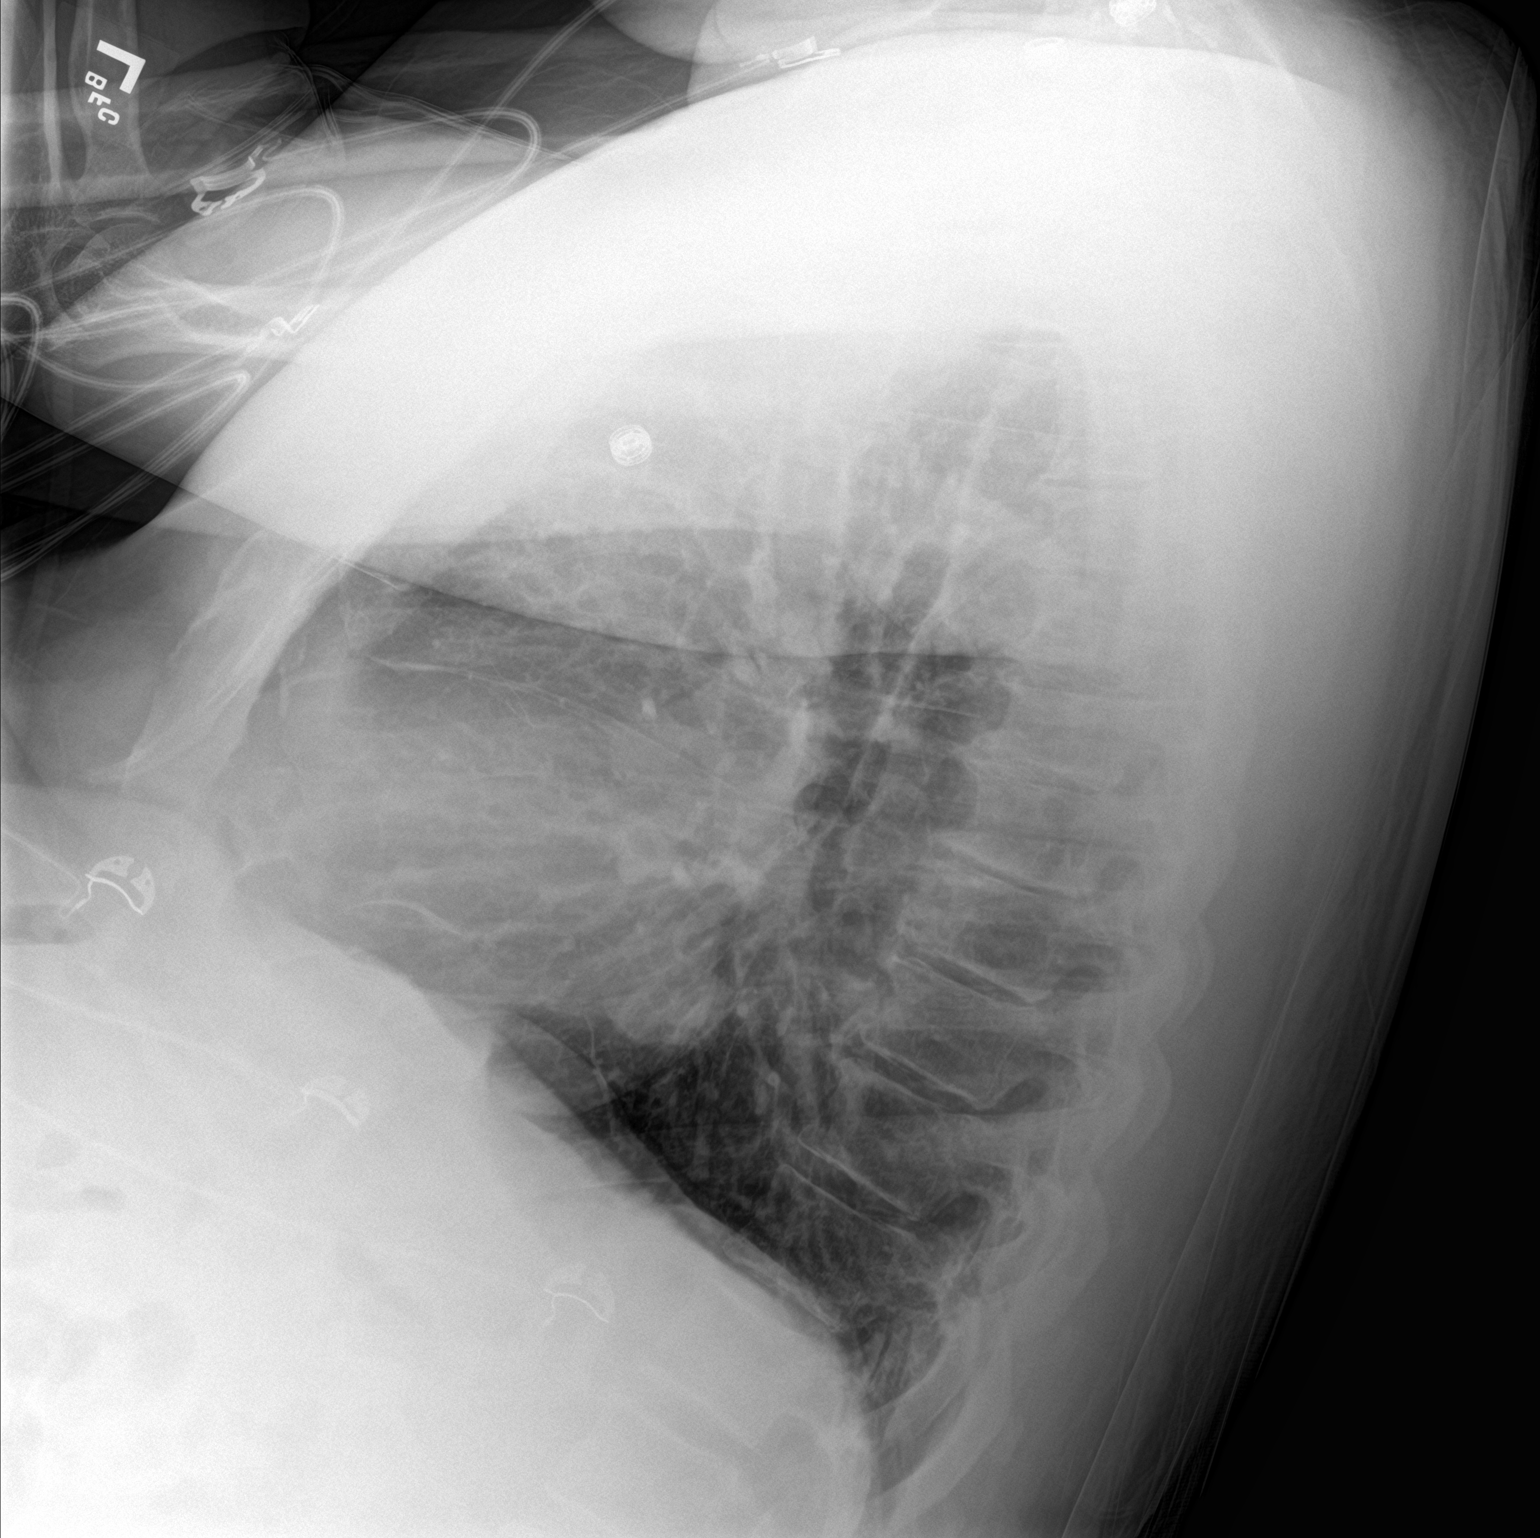

[chest ap]
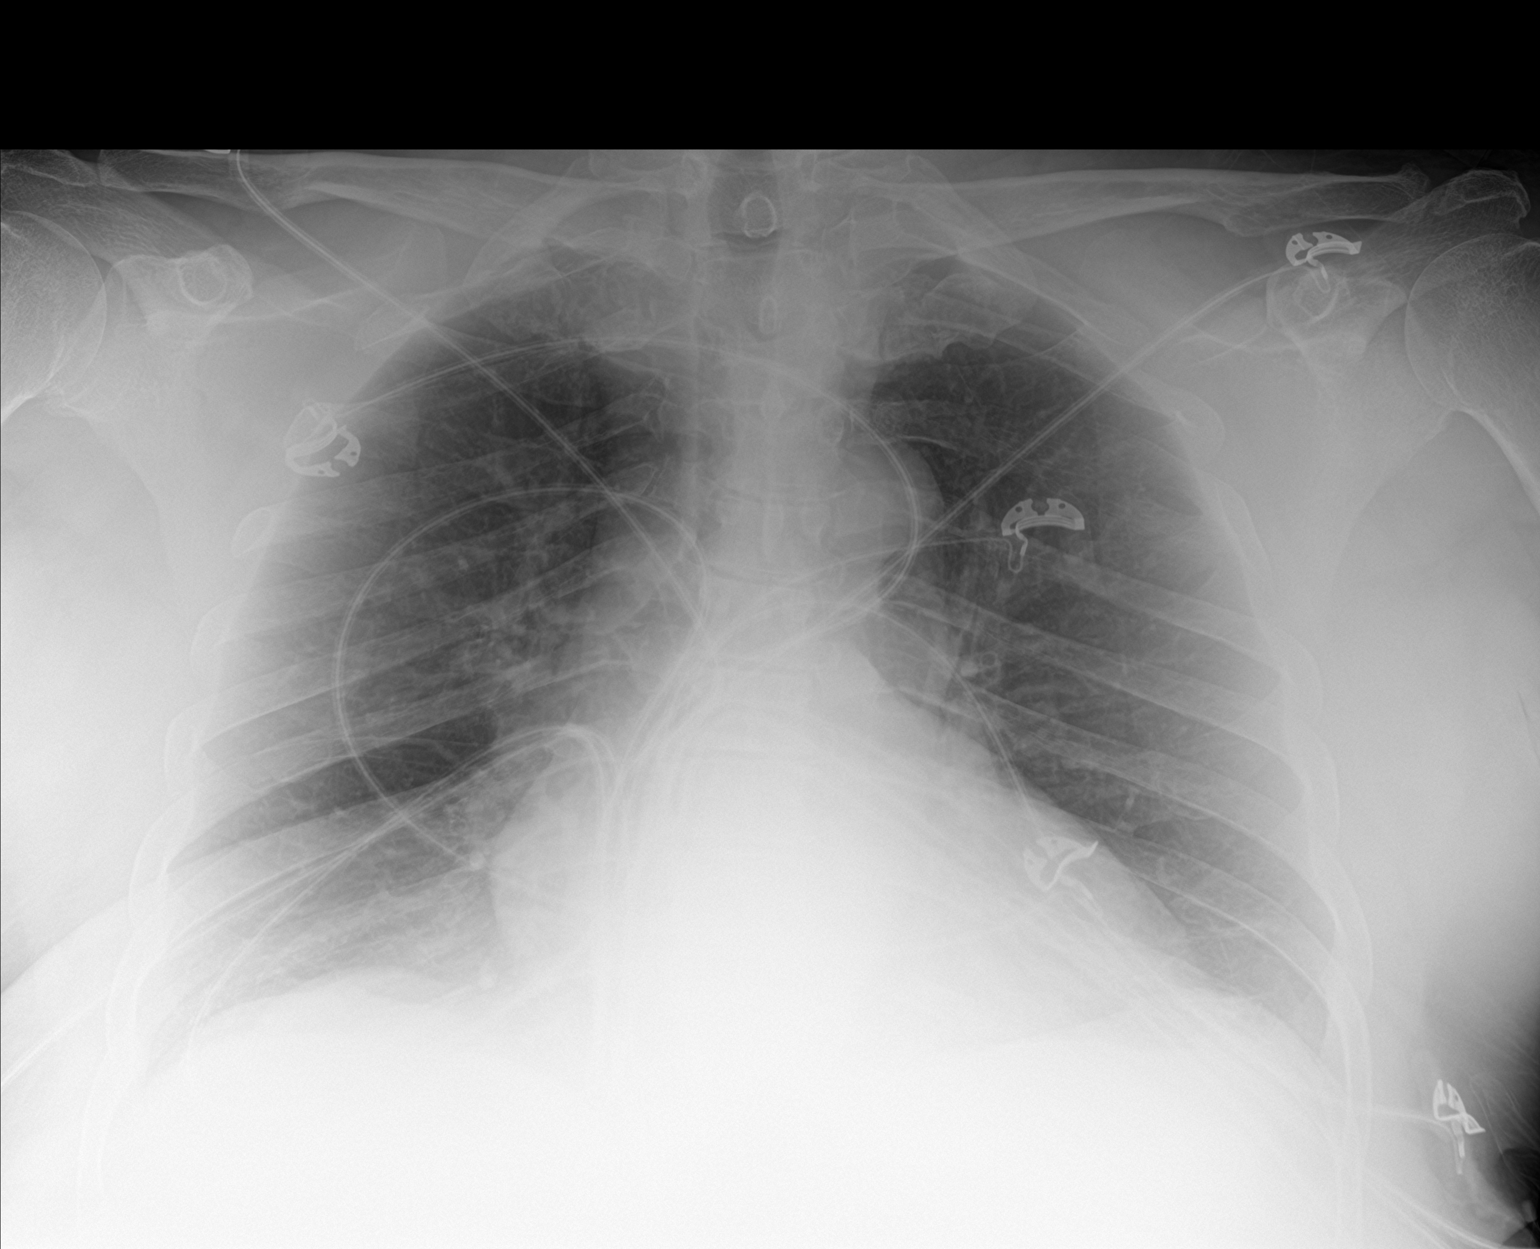

[2 of 2 positions shown; findings below may reference images not displayed]

FINDINGS: Mild hyperinflation suggesting emphysematous change. Borderline
heart size and pulmonary vascularity are likely normal for
technique. No focal airspace disease or consolidation in the lungs.
No blunting of costophrenic angles. No pneumothorax. Degenerative
changes in the spine.
IMPRESSION: Emphysematous changes in the lungs. No evidence of active pulmonary
disease.

## 2018-11-18 ENCOUNTER — Encounter: Payer: Self-pay | Admitting: Family Medicine

## 2018-11-29 ENCOUNTER — Other Ambulatory Visit: Payer: Self-pay | Admitting: Family Medicine

## 2018-12-01 NOTE — Telephone Encounter (Signed)
Please inform patient to follow up for PE. Per notes from his hospitalization he was scheduled to follow up in 6 months to have repeat imagining and to determine if he needs life long anticoagulation.   Dalphine Handing, PGY-3 [facility] 12/01/2018 12:19 PM

## 2018-12-06 IMAGING — CR DG CHEST 2V
2 series · 2 of 2 positions shown · non-contrast
Comparison: 06/28/2016 chest radiograph.

CLINICAL DATA: Cough, fever

EXAM:
CHEST  2 VIEW

[chest pa]
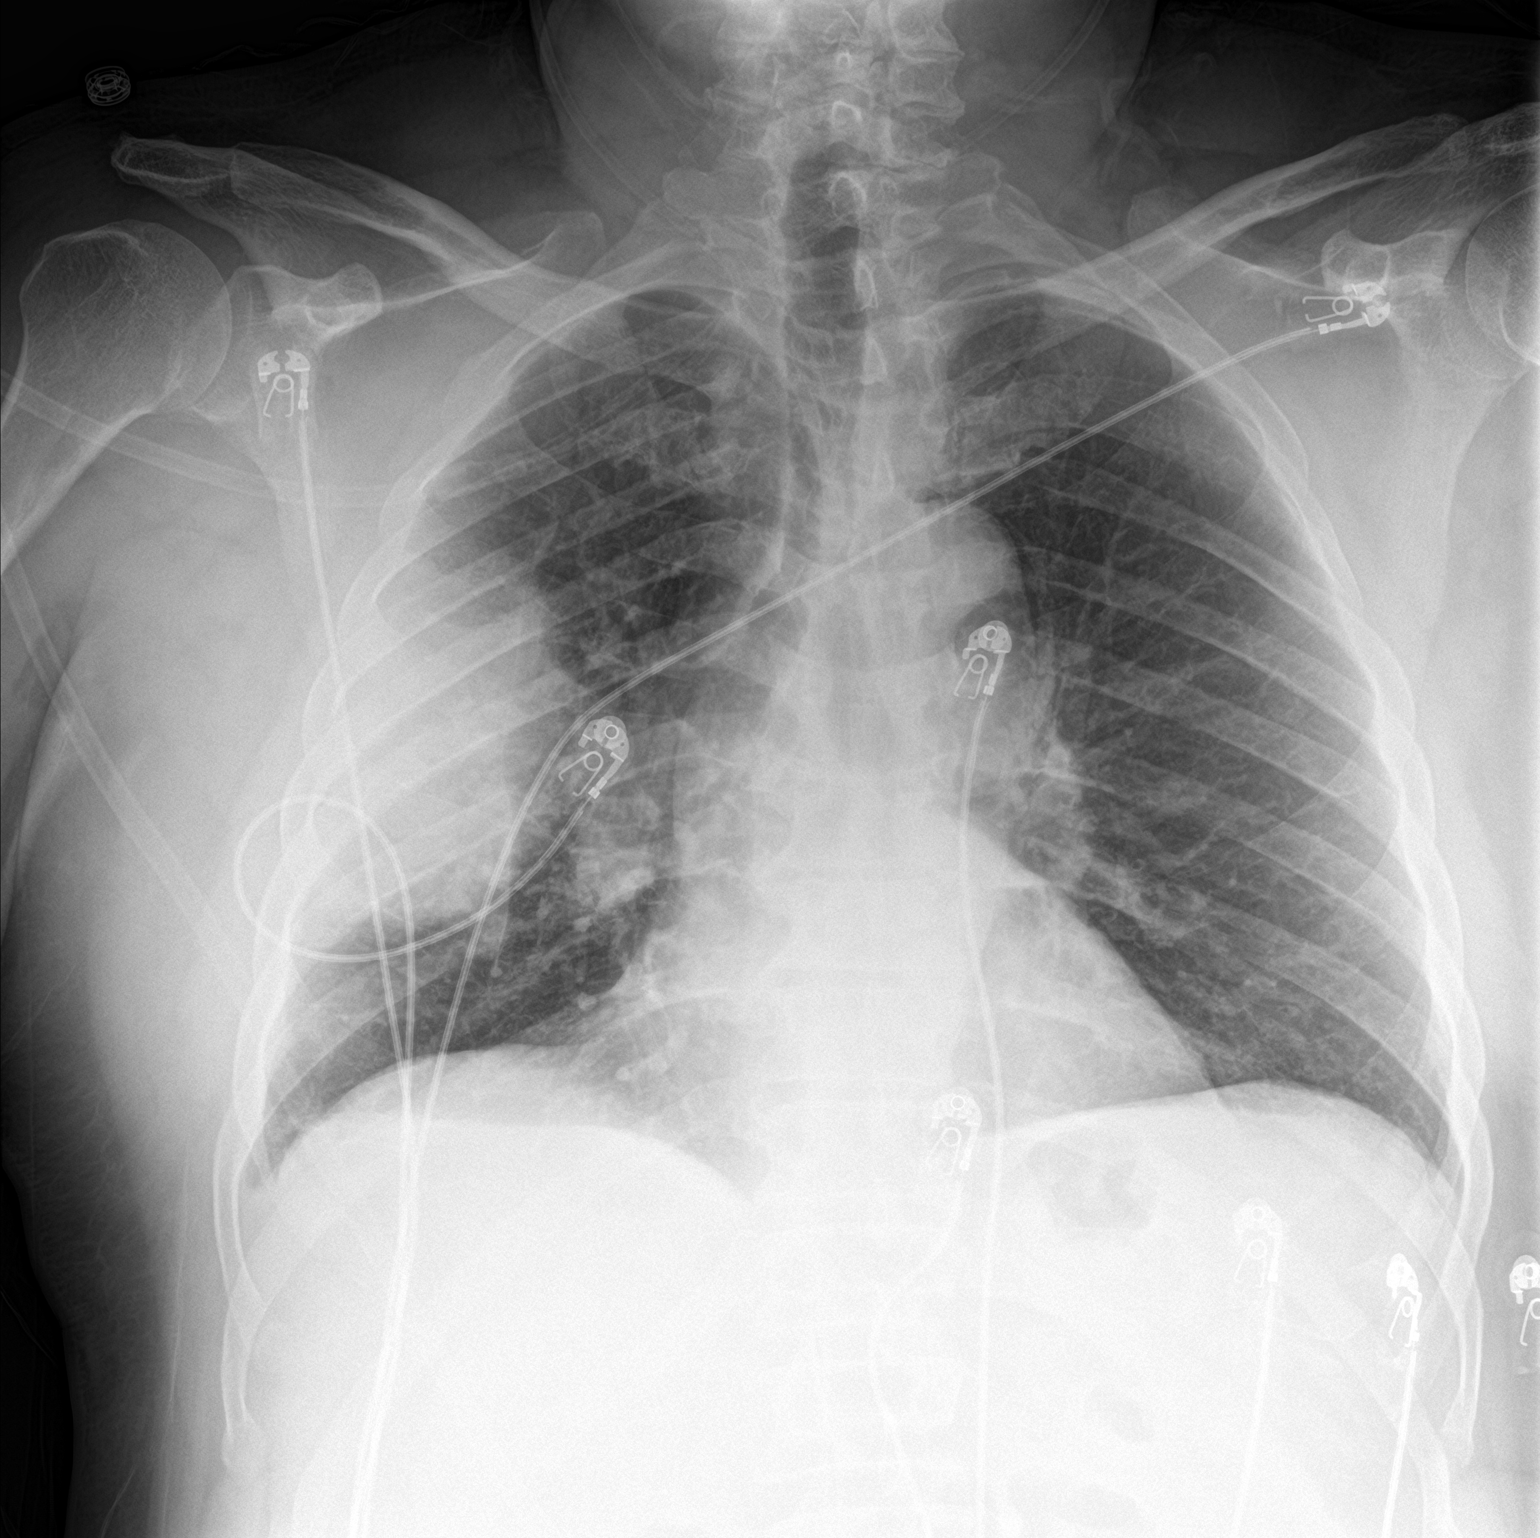

[chest lat]
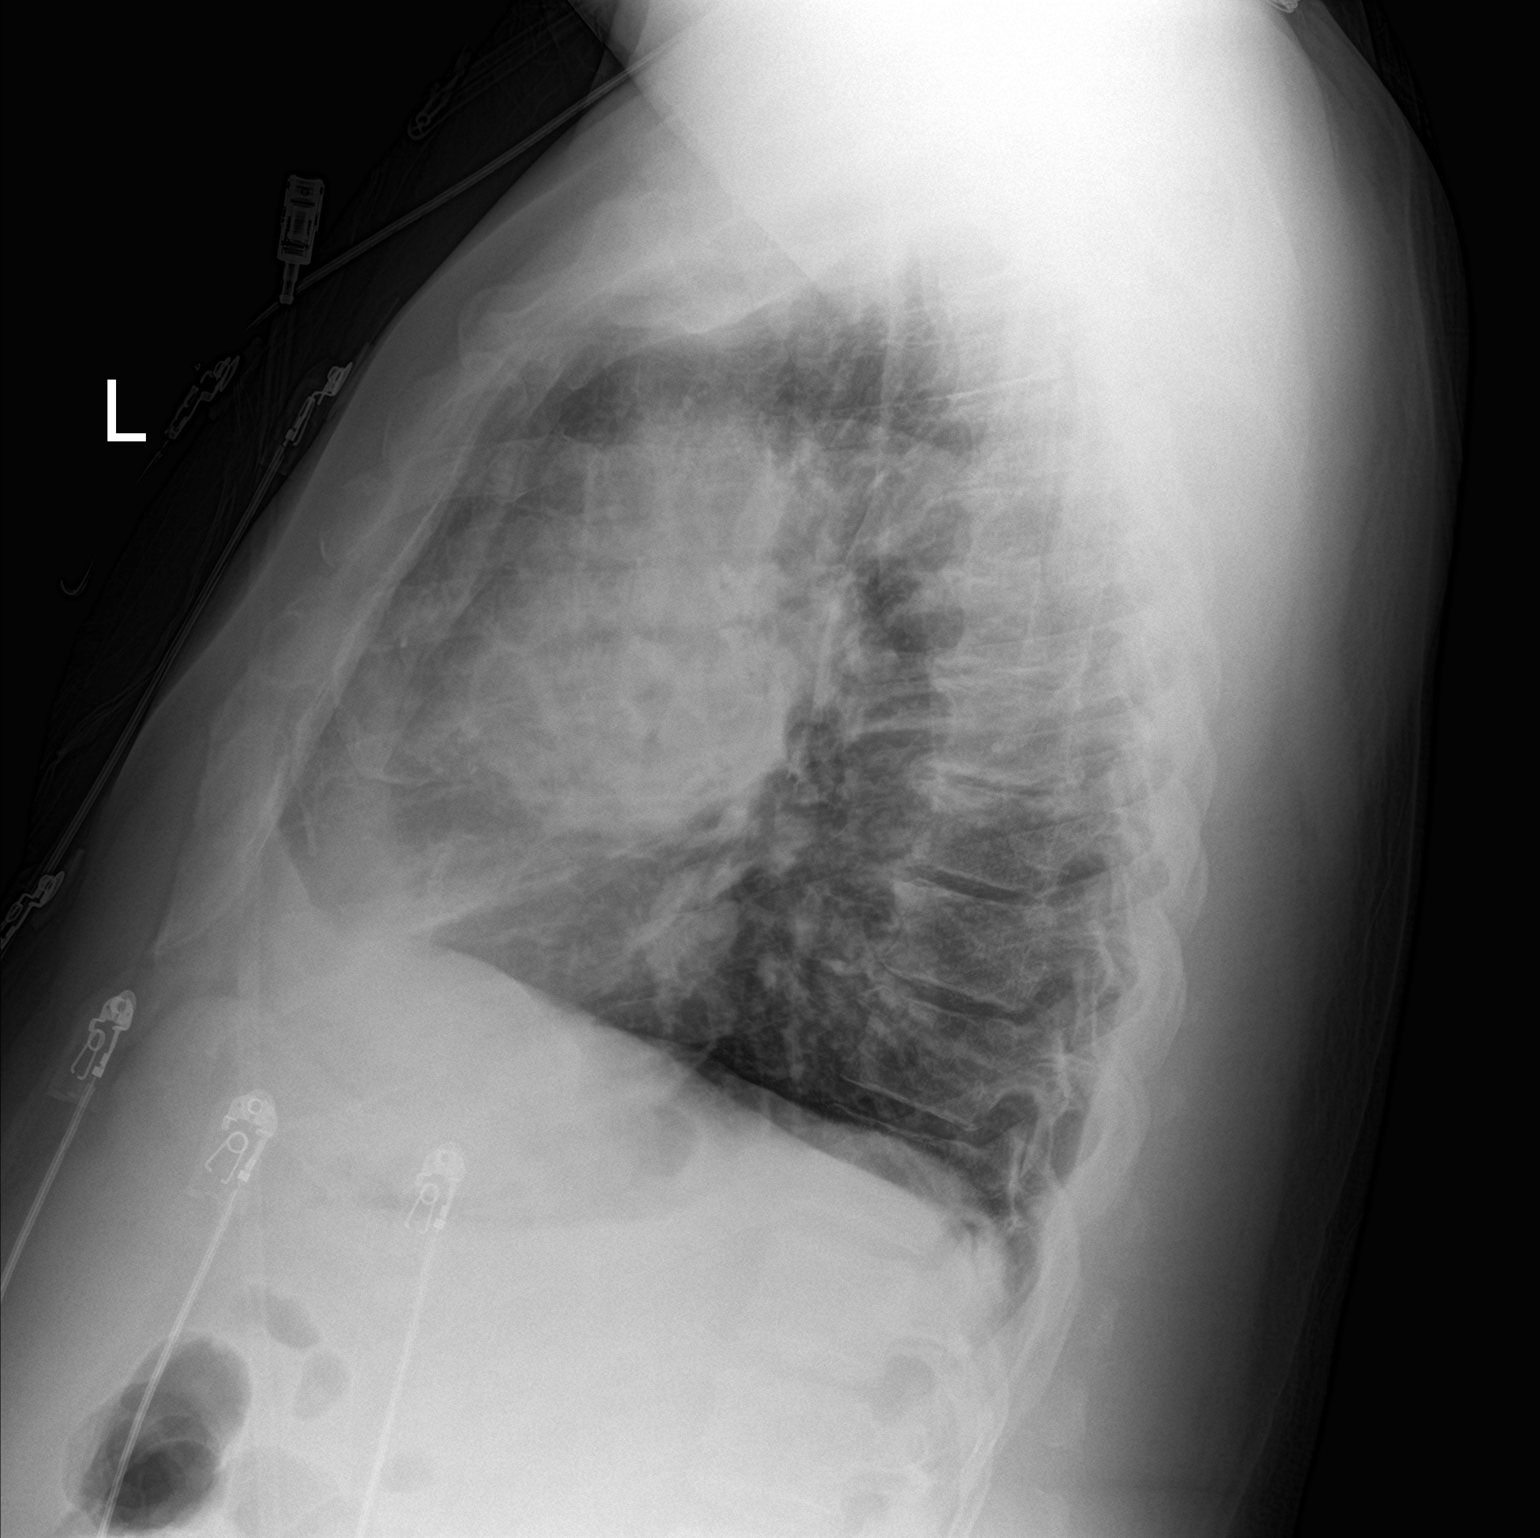

[2 of 2 positions shown; findings below may reference images not displayed]

FINDINGS: Stable cardiomediastinal silhouette with normal heart size. No
pneumothorax. No pleural effusion. New masslike focus of
consolidation in the peripheral right mid lung. Clear left lung. No
pulmonary edema. Hyperinflated lungs.
IMPRESSION: 1. Masslike focus of consolidation in the peripheral right mid lung
is new since 06/28/2016. The rapid onset of this finding is most
compatible with pneumonia. No pleural effusion. Recommend follow-up
PA and lateral post treatment chest radiographs in 4-6 weeks.
2. Hyperinflated lungs, suggesting COPD.

## 2018-12-07 IMAGING — CR DG CHEST 1V PORT
1 series · 1 of 1 positions shown · non-contrast
Comparison: 07/21/2016

CLINICAL DATA: Increasing shortness of breath and chest pain.

EXAM:
PORTABLE CHEST 1 VIEW

[AP]
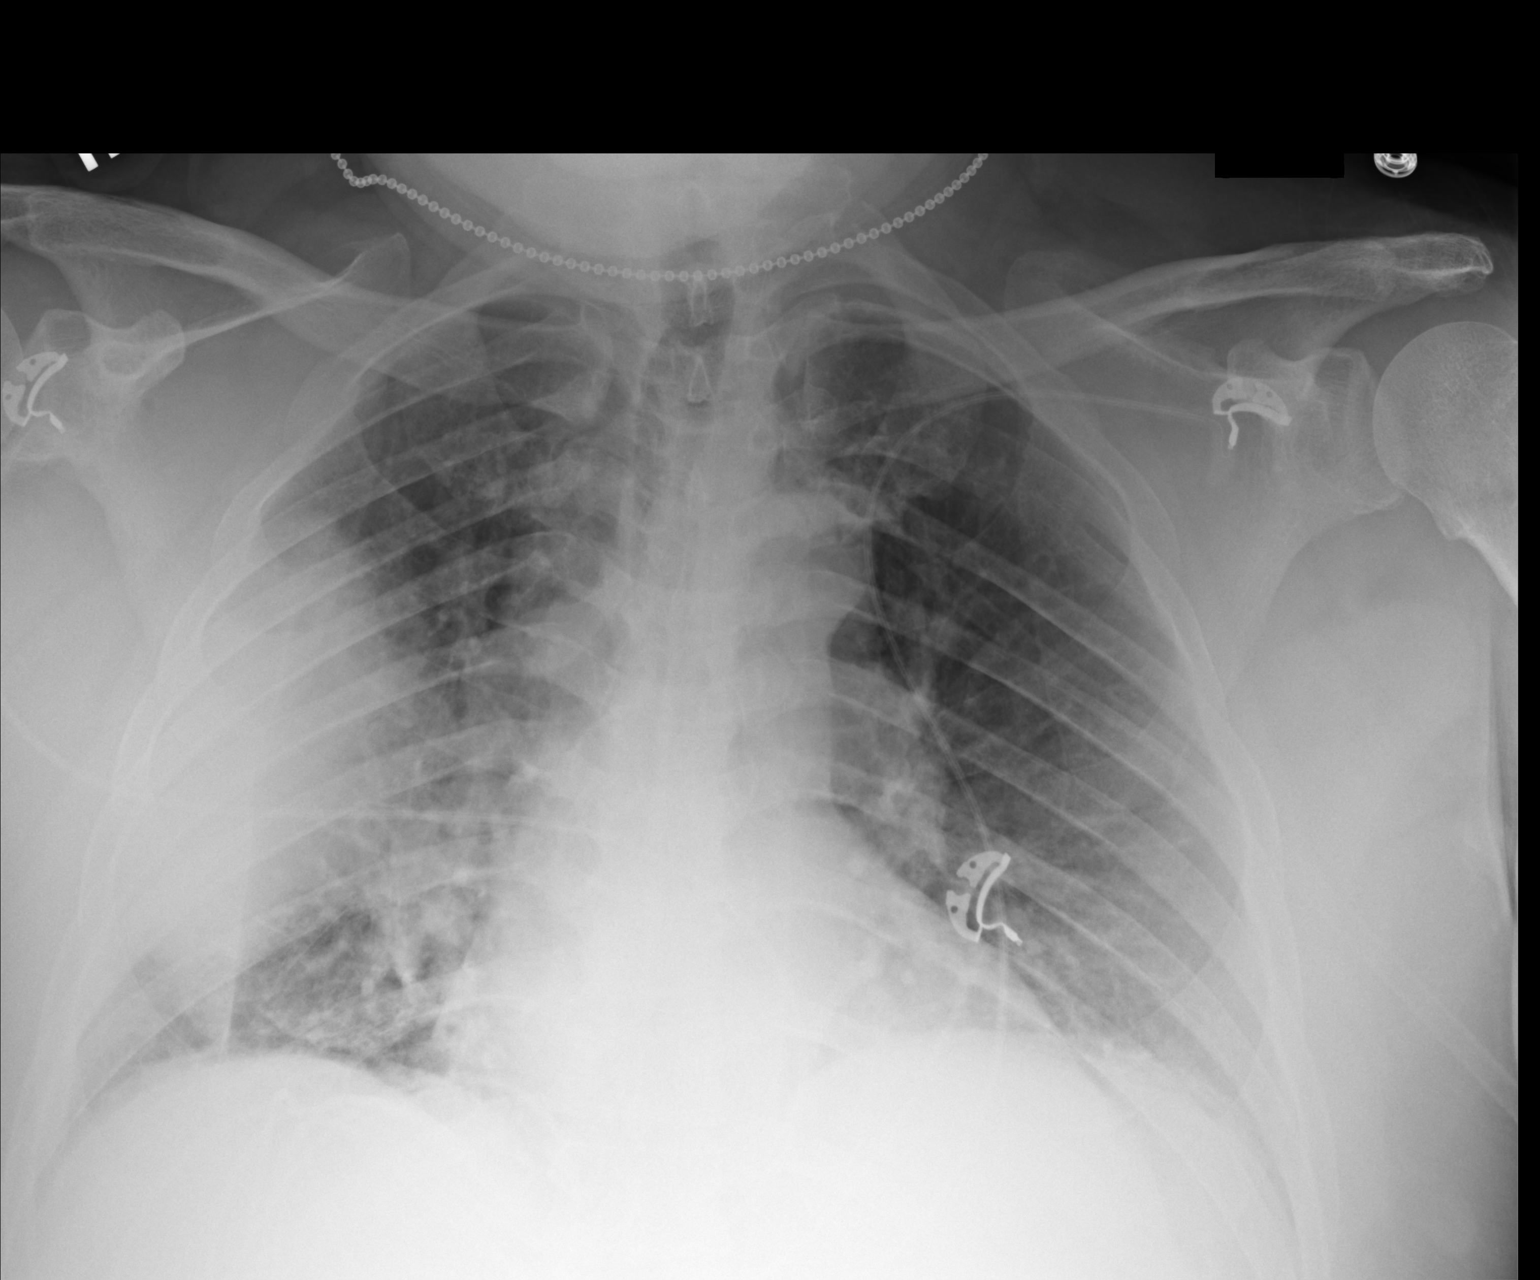

[1 of 1 positions shown; findings below may reference images not displayed]

FINDINGS: Shallow inspiration. Heart size and pulmonary vascularity are normal
for technique. Masslike opacity in the right mid lung is increasing
since previous study, likely representing progressing pneumonia.
Left lung is clear. No blunting of costophrenic angles. No
pneumothorax.
IMPRESSION: Increasing consolidation in the right mid lung since previous study
suggesting progression of pneumonia. Followup PA and lateral chest
X-ray is recommended in 3-4 weeks following trial of antibiotic
therapy to ensure resolution and exclude underlying malignancy.

## 2018-12-17 ENCOUNTER — Telehealth: Payer: Self-pay | Admitting: Family Medicine

## 2018-12-17 NOTE — Telephone Encounter (Signed)
Patient missed appointment for surgery on his toe because he had to go to his wife's funeral.  He needs to get that rescheduled and/or call the person who is going to do the surgery because his toe is really bothering him now.  Please call him asap at [phone].

## 2018-12-18 ENCOUNTER — Ambulatory Visit (INDEPENDENT_AMBULATORY_CARE_PROVIDER_SITE_OTHER): Payer: Medicaid Other | Admitting: Podiatry

## 2018-12-18 ENCOUNTER — Other Ambulatory Visit: Payer: Self-pay

## 2018-12-18 DIAGNOSIS — B070 Plantar wart: Secondary | ICD-10-CM | POA: Diagnosis not present

## 2018-12-18 NOTE — Telephone Encounter (Signed)
Pt has an appt at [facility] this afternnon and pt states he is going. Kline Bulthuis Kennon Holter, CMA

## 2018-12-19 ENCOUNTER — Ambulatory Visit: Payer: Medicaid Other | Admitting: Family Medicine

## 2018-12-22 ENCOUNTER — Other Ambulatory Visit: Payer: Self-pay

## 2018-12-22 ENCOUNTER — Ambulatory Visit (INDEPENDENT_AMBULATORY_CARE_PROVIDER_SITE_OTHER): Payer: Medicaid Other | Admitting: Family Medicine

## 2018-12-22 VITALS — BP 128/80 | HR 96 | Wt 285.8 lb

## 2018-12-22 DIAGNOSIS — M79671 Pain in right foot: Secondary | ICD-10-CM

## 2018-12-22 DIAGNOSIS — M545 Low back pain, unspecified: Secondary | ICD-10-CM

## 2018-12-22 MED ORDER — OXYCODONE-ACETAMINOPHEN 5-325 MG PO TABS: 1.0000 | ORAL_TABLET | Freq: Four times a day (QID) | ORAL | 0 refills | Status: AC | PRN

## 2018-12-22 NOTE — Assessment & Plan Note (Addendum)
Moderate, uncontrolled with OTC maximum analgesia.  Exam notable for muscular tenderness along lumbar paraspinal musculature and positive straight leg raise on R with preserved strength and gait.  No red flags.  Differential remaining broad including nonspecific back pain, herniated disc with positive straight leg, arthritis. Will manage acute pain supportively, however do believe he needs further evaluation for his chronic back pain that continues to progress over the past several years and impacting his quality/ability to perform ADLs. -Percocet 5-325 mg, 5-day supply, PMP aware reviewed and appropriate  -Lumbar XR, last visualized in 2003, assess for structural abnormality/joint narrowing -Provided with lumbar exercises, encouraged to do twice daily -Rest, ice/heat -Recommend follow-up with his already established orthopedist in the future -Follow-up in approximately 1-2 weeks if improving or sooner if worsening (red flags/ED precautions including weakness, numbness, bowel/bladder incontinence, saddle anesthesia reviewed)

## 2018-12-22 NOTE — Progress Notes (Signed)
Subjective:    Patient ID: Ernest White, male    DOB: March 30, 1955, 63 y.o.   MRN: [id number]   CC: "Back pain "  HPI: Ernest White is a 63 year old gentleman with chronic back pain, previous PE, elevated BMI, COPD, hypertension presenting discuss the following:  Back pain: Acute on chronic, however worse in the past few weeks.  Seems to be both sides of his back (however R>L) without radicular symptoms, has some tingling on the right side of his lower back.  Says it feels like "someone has a chisel into his back ".  Feels like he cannot sit or stand too long due to the pain.  Nothing makes it better.  Sitting down and bending forward to seem to make it worse.  Sometimes he feels like he is getting nauseous because the pain is so severe.  Endorses a history where he fell off a 16 feet ladder back in the 80s and fell out of an apple tree as a child, says he has had chronic back pain since that time and only seems to be getting worse as he ages.  Last spine imaging appears to be in 2003 showing mild stenotic changes at L3/4 and L4/5 with disc protrusion on the L3.  He has been using Tylenol and ibuprofen around-the-clock at maximum doses without relief.  Denies any associated weakness, numbness, bowel/bladder incontinence/retention, saddle anesthesia, difficulty walking, dysuria, fever, fatigue.  This acute pain and his chronic back pain have been impacting his quality of life and ability to do his ADLs.  Had physical therapy for his ankle fracture, however has not completed PT for his back or seen a specialist for his back.  Already has an orthopedist he saw in 2019 for his ankle.  Diabetes: Due to follow-up with his primary care provider for diabetes.  Says he is doing well with addition of Jardiance.  Already lost 10 pounds since August and hoping to lose more, notes this will help his back pain as well.  Smoking status reviewed  Review of Systems Per HPI    Objective:  BP 128/80   Pulse  96   Wt 285 lb 12.8 oz (129.6 kg)   SpO2 97%   BMI 39.86 kg/m  Vitals and nursing note reviewed  General: Appears in mild distress due to back pain with movements Respiratory: Unlabored breathing Abdomen: Nondistended Extremities: Tenderness to very minimal palpation of lumbar paraspinal musculature bilaterally, no point tenderness to spinous process or step-off.  5/5 lower extremity strength bilaterally.  Sensation to light touch intact bilaterally.  ROM at hip limited by pain.  Gait normal.  Dorsalis pedis and posterior tibialis palpated bilaterally.  Straight leg raise positive on the right. Skin: warm and dry, no rashes noted Neuro: alert and oriented, no focal deficits Psych: normal affect  Assessment & Plan:   Acute bilateral low back pain without sciatica Moderate, uncontrolled with OTC maximum analgesia.  Exam notable for muscular tenderness along lumbar paraspinal musculature and positive straight leg raise on R with preserved strength and gait.  No red flags.  Differential remaining broad including nonspecific back pain, herniated disc with positive straight leg, arthritis. Will manage acute pain supportively, however do believe he needs further evaluation for his chronic back pain that continues to progress over the past several years and impacting his quality/ability to perform ADLs. -Percocet 5-325 mg, 5-day supply, PMP aware reviewed and appropriate  -Lumbar XR, last visualized in 2003, assess for structural abnormality/joint narrowing -Provided with  lumbar exercises, encouraged to do twice daily -Rest, ice/heat -Recommend follow-up with his already established orthopedist in the future -Follow-up in approximately 1-2 weeks if improving or sooner if worsening (red flags/ED precautions including weakness, numbness, bowel/bladder incontinence, saddle anesthesia reviewed)  Uncontrolled type 2 diabetes mellitus without complication, without long-term current use of insulin (HCC)  Improving.  A1c 12.3 back in 10/2018.  Started on Jardiance and increased metformin in August, has lost 10 pounds since that time.  Recommend following up with PCP in 1-2 weeks in addition to above to discuss diabetes control.    Follow-up in approximately 1-2 weeks for above or sooner if worsening  [place] Resident PGY-2

## 2018-12-22 NOTE — Assessment & Plan Note (Signed)
Improving.  A1c 12.3 back in 10/2018.  Started on Jardiance and increased metformin in August, has lost 10 pounds since that time.  Recommend following up with PCP in 1-2 weeks in addition to above to discuss diabetes control.

## 2018-12-22 NOTE — Patient Instructions (Signed)
It was wonderful to see you today.  I will send in some pain medication for this acute time, please also make sure you are trying to maintain your range of motion, you can always use heat for 20 minutes at a time a few times a day to help with this.  If you experience any weakness, numbness, changes in your bowel or bladder function, or significant pain not improved with pain medication--please follow-up or go to the ED.  I encourage you to continue working towards a well-balanced diet and increasing your physical activity to help lose weight that will likely help with your pain.  I have also put an order for you to get back x-rays, you can walk-in at any time to the [facility] imaging center to have these completed.  After this acute phase is over, I do recommend you follow-up with your orthopedist.

## 2019-01-01 ENCOUNTER — Ambulatory Visit: Payer: Medicaid Other | Admitting: Student in an Organized Health Care Education/Training Program

## 2019-01-06 ENCOUNTER — Telehealth: Payer: Self-pay | Admitting: *Deleted

## 2019-01-06 NOTE — Telephone Encounter (Signed)
Please let patient know he can walk-in at any time to have his lumbar x-ray completed rather than waiting till the 12th.  I would like him to follow-up in the clinic to assess his back again along with his diabetes prior to prescribing any additional medicines, discussed with him at last visit that this would not be a long-term prescription.  Patriciaann Clan, DO

## 2019-01-06 NOTE — Telephone Encounter (Signed)
Pt states that he has going for imaging @ [facility] on 01/22/19.  He is requesting a refill on the medication he was given until that time. Christen Bame, CMA

## 2019-01-08 ENCOUNTER — Other Ambulatory Visit: Payer: Self-pay | Admitting: Family Medicine

## 2019-01-08 DIAGNOSIS — E119 Type 2 diabetes mellitus without complications: Secondary | ICD-10-CM

## 2019-01-08 MED ORDER — ACCU-CHEK AVIVA PLUS VI STRP: ORAL_STRIP | 12 refills | Status: DC

## 2019-01-08 NOTE — Telephone Encounter (Signed)
Please inform patient that his Norco is prescribed by orthopedics. He should call them to get further refills.   I have refilled accu check as will as strips.   Dalphine Handing, PGY-3 [facility] 01/08/2019 10:18 AM

## 2019-01-08 NOTE — Telephone Encounter (Signed)
Attempted to call patient to instruct him on walk in x rays.  There was no answer and no voice mail available.  Ernest White, [place]

## 2019-01-08 NOTE — Telephone Encounter (Signed)
Per note from Dr. Higinio Plan on 12/22/18 "Recommend follow-up with his already established orthopedist in the future Follow-up in approximately 1-2 weeks if improving or sooner if worsening (red flags/ED precautions including weakness, numbness, bowel/bladder incontinence, saddle anesthesia reviewed)"  If patient is still having pain he should follow up with orthopedics and if they are unable to see him he should be seen here in clinic in order to get further refills.   Ernest White, PGY-3 [facility] 01/08/2019 10:22 AM

## 2019-01-08 NOTE — Telephone Encounter (Signed)
Pt requesting the test strips today if possible. He is out. Having to use old meter. New meter is in [place]. Also requesting the pain meds he was given for his back. Ottis Stain, CMA

## 2019-01-13 ENCOUNTER — Ambulatory Visit: Payer: Medicaid Other | Admitting: Family Medicine

## 2019-01-13 ENCOUNTER — Ambulatory Visit: Payer: Medicaid Other

## 2019-01-21 ENCOUNTER — Ambulatory Visit: Payer: Medicaid Other

## 2019-01-22 ENCOUNTER — Ambulatory Visit (INDEPENDENT_AMBULATORY_CARE_PROVIDER_SITE_OTHER): Payer: Medicaid Other | Admitting: Family Medicine

## 2019-01-22 ENCOUNTER — Other Ambulatory Visit: Payer: Self-pay

## 2019-01-22 VITALS — BP 100/56 | HR 97 | Ht 71.0 in | Wt 287.4 lb

## 2019-01-22 DIAGNOSIS — G8929 Other chronic pain: Secondary | ICD-10-CM

## 2019-01-22 DIAGNOSIS — Z23 Encounter for immunization: Secondary | ICD-10-CM | POA: Diagnosis not present

## 2019-01-22 DIAGNOSIS — M545 Low back pain, unspecified: Secondary | ICD-10-CM

## 2019-01-22 DIAGNOSIS — E119 Type 2 diabetes mellitus without complications: Secondary | ICD-10-CM | POA: Diagnosis not present

## 2019-01-22 DIAGNOSIS — M25561 Pain in right knee: Secondary | ICD-10-CM | POA: Diagnosis not present

## 2019-01-22 LAB — POCT GLYCOSYLATED HEMOGLOBIN (HGB A1C): HbA1c, POC (controlled diabetic range): 8.1 % — AB (ref 0.0–7.0)

## 2019-01-22 NOTE — Patient Instructions (Addendum)
It was a pleasure to see you today! Thank you for choosing [facility] for your primary care. Ernest White was seen for back pain and right knee pain.   Congratulations, your diabetes is improved.  Please follow-up with your regular doctor in 1 month to reassess ongoing diabetes needs.  For your back pain, you may continue take Tylenol, lidocaine patches, Flexeril as needed for back pain.  We also referring you to physical therapy.  Please sure to go get your x-rays for your lower back.  You may follow-up with your orthopedist as well for further evaluation.  Please contact clinic in 1 week if no improvement after physical therapy.  Please avoid drugs like ibuprofen, naproxen, or other NSAIDs while you are taking Xarelto as this can lead to life-threatening bleeding.  Please follow-up in clinic for your right knee pain if you have no improvement.   Best,  Marny Lowenstein, MD, MS FAMILY MEDICINE RESIDENT - PGY3 01/22/2019 2:59 PM

## 2019-01-22 NOTE — Assessment & Plan Note (Addendum)
A1c significantly improved on Jardiance.  Recommend patient follow-up with PCP in 1 month to discuss further evaluation and management.

## 2019-01-22 NOTE — Progress Notes (Signed)
Subjective:  Ernest White is a 63 y.o. male who presents to the [facility] today with a chief complaint of lower back pain.   HPI:  Lower back pain, not improved Patient is here for follow-up predominantly for lower back pain.  States he has had lower back pain off and on for several years.  Says has been worse over the past 2 months.  Patient was last seen in clinic for back pain on 12/22/2018.  At that time he had already been taking Tylenol, ibuprofen without improvement.  Patient denies any weakness, numbness, bowel or bladder incontinence.  Says he has difficulty walking.  Patient states that he is also tried lidocaine patches, Flexeril, and Voltaren gel.  Said that none of this helped.  At last visit patient was prescribed 5-day course of Percocet.  He said that this helped him out greatly and that the pain went away when he was on these.  Said the pain came back when you are had these pills.  He is asking for refill today.  At last visit, patient was referred to get lumbar imaging.  He has not yet been able to get these.  He was also encouraged to follow-up with his orthopedist, he has not yet done this.  Denies any trauma to the back.  Right knee pain, acute Patient reports right knee pain over the past 2 days.  He is concerned it is gout.  He has had no pain relief with ibuprofen and Tylenol.  Denies any trauma to the knee.  Diabetes mellitus type 2, improved Patient A1c was 12.3, 3 months ago.  Patient was recently started on Jardiance.  He is also on Metformin.  A1c significant improved today at 8.1.   ROS: Per HPI  PMH: Patient denies history of cancer.  PHQ9 SCORE ONLY 01/22/2019 12/22/2018 10/20/2018  Score 20 0 0     Objective:  Physical Exam: BP (!) 100/56   Pulse 97   Ht 5\' 11"  (1.803 m)   Wt 287 lb 6 oz (130.4 kg)   SpO2 99%   BMI 40.08 kg/m   Gen: NAD, sitting in wheel chair Pulm: NWOB MSK: Patient is claiming lower back is tender bilaterally.  Claims it is  incredibly tender even when not medically not touching.  5 of 5 strength in lower extremities bilaterally.  Normal sensation in lower extremities bilaterally. Limited knee exam as patient is in wheelchair, I am able to pull the pant legs up and examine the right knee.  Joint is stable tender on the right lateral, proximal aspect of the knee.  No joint line tenderness.  No pain with flexion or extension.  Knee is not warm.  No obvious swelling or effusion especially compared to contralateral knee. Skin: warm, dry Neuro: grossly normal, moves all extremities  Results for orders placed or performed in visit on 01/22/19 (from the past 72 hour(s))  HgB A1c     Status: Abnormal   Collection Time: 01/22/19  1:40 PM  Result Value Ref Range   Hemoglobin A1C     HbA1c POC (<> result, manual entry)     HbA1c, POC (prediabetic range)     HbA1c, POC (controlled diabetic range) 8.1 (A) 0.0 - 7.0 %    Assessment/Plan:  Chronic low back pain without sciatica Chronic lower back pain.  Pain resistant to multiple modalities.  No red flag symptoms.  Patient has imaging ordered, encourage patient to follow-up.  Patient is on anticoagulation for pulmonary  embolism.  He has been taking OTC NSAIDs for pain.  Encourage patient to discontinue this due to significant risk of bleeding.  Recommend continue Tylenol, muscle relaxants, lidocaine patches.  Will refer patient to physical therapy for additional management.  Patient may also follow-up with his orthopedist for additional evaluation of lower back pain.  Patient follow-up in 1 week if no improvement after physical therapy.  Acute pain of right knee Without history of trauma.  Limited exam today to assess for etiology.  No obvious swelling or joint instability, unlikely gout.  Although no imaging, likely osteoarthritis given his age and  Limited mobility.  risk factors such as obesity and limited mobility.  recommended patient use the same modalities that he has been  using for his back pain including Tylenol, lidocaine patches, diclofenac gel.  He also get physical therapy for his knee.  Patient has no improvement in 1 week, patient return, consider getting knee imaging.  Diabetes mellitus without complication ([place]) D3O significantly improved on Jardiance.  Recommend patient follow-up with PCP in 1 month to discuss further evaluation and management.    Lab Orders     HgB A1c  No orders of the defined types were placed in this encounter.     Marny Lowenstein, MD, MS FAMILY MEDICINE RESIDENT - PGY3 01/22/2019 5:17 PM

## 2019-01-22 NOTE — Assessment & Plan Note (Signed)
Without history of trauma.  Limited exam today to assess for etiology.  No obvious swelling or joint instability, unlikely gout.  Although no imaging, likely osteoarthritis given his age and  Limited mobility.  risk factors such as obesity and limited mobility.  recommended patient use the same modalities that he has been using for his back pain including Tylenol, lidocaine patches, diclofenac gel.  He also get physical therapy for his knee.  Patient has no improvement in 1 week, patient return, consider getting knee imaging.

## 2019-01-22 NOTE — Assessment & Plan Note (Signed)
Chronic lower back pain.  Pain resistant to multiple modalities.  No red flag symptoms.  Patient has imaging ordered, encourage patient to follow-up.  Patient is on anticoagulation for pulmonary embolism.  He has been taking OTC NSAIDs for pain.  Encourage patient to discontinue this due to significant risk of bleeding.  Recommend continue Tylenol, muscle relaxants, lidocaine patches.  Will refer patient to physical therapy for additional management.  Patient may also follow-up with his orthopedist for additional evaluation of lower back pain.  Patient follow-up in 1 week if no improvement after physical therapy.

## 2019-02-28 ENCOUNTER — Other Ambulatory Visit: Payer: Self-pay | Admitting: Family Medicine

## 2019-04-09 ENCOUNTER — Ambulatory Visit (INDEPENDENT_AMBULATORY_CARE_PROVIDER_SITE_OTHER): Payer: Medicaid Other | Admitting: Physician Assistant

## 2019-04-09 ENCOUNTER — Ambulatory Visit (INDEPENDENT_AMBULATORY_CARE_PROVIDER_SITE_OTHER): Payer: Medicaid Other

## 2019-04-09 ENCOUNTER — Encounter: Payer: Self-pay | Admitting: Physician Assistant

## 2019-04-09 ENCOUNTER — Other Ambulatory Visit: Payer: Self-pay

## 2019-04-09 DIAGNOSIS — M79672 Pain in left foot: Secondary | ICD-10-CM

## 2019-04-09 MED ORDER — METHYLPREDNISOLONE ACETATE 40 MG/ML IJ SUSP
40.0000 mg | INTRAMUSCULAR | Status: AC | PRN
  Administered 2019-04-09: 40 mg

## 2019-04-09 MED ORDER — TRAMADOL HCL 50 MG PO TABS: 50.0000 mg | ORAL_TABLET | Freq: Four times a day (QID) | ORAL | 0 refills | Status: DC | PRN

## 2019-04-09 MED ORDER — LIDOCAINE HCL 1 % IJ SOLN
1.0000 mL | INTRAMUSCULAR | Status: AC | PRN
  Administered 2019-04-09: 1 mL

## 2019-04-09 NOTE — Progress Notes (Signed)
Office Visit Note   Patient: Ernest White           Date of Birth: Oct 21, 1955           MRN: [id number] Visit Date: 04/09/2019              Requested by: Oralia Manis, DO [address] [place],  [place] [address] PCP: Oralia Manis, DO   Assessment & Plan: Visit Diagnoses:  1. Pain in left foot     Plan: He is given tramadol for pain.  We will see him back in 2 weeks to see what type of response he had to the injection.  He will warmth stretching exercises are shown.  He is to get a better shoe wear and avoid open back shoes.  Questions were encouraged and answered.  Follow-Up Instructions: Return in about 2 weeks (around 04/23/2019).   Orders:  Orders Placed This Encounter  Procedures  . Foot Inj  . XR Foot Complete Left   Meds ordered this encounter  Medications  . traMADol (ULTRAM) 50 MG tablet    Sig: Take 1 tablet (50 mg total) by mouth every 6 (six) hours as needed.    Dispense:  40 tablet    Refill:  0      Procedures: Foot Inj  Date/Time: 04/09/2019 4:40 PM Performed by: Kirtland Bouchard, PA-C Authorized by: Kirtland Bouchard, PA-C   Consent Given by:  Patient Condition: Plantar Fasciitis   Location: left plantar fascia muscle   Approach:  Plantar Medications:  1 mL lidocaine 1 %; 40 mg methylPREDNISolone acetate 40 MG/ML     Clinical Data: No additional findings.   Subjective: Chief Complaint  Patient presents with  . Left Ankle - Pain    HPI Ernest White comes in today for left foot pain for the last 3 to 4 weeks.  No known injury.  He is concerned about a spot on his ankle in a chain.  Standing constant achy pain on the bottom of his foot.  Does have some tingling he is diabetic is been diabetic for last 5 years.  Last hemoglobin A1c 2 months ago was 8.1.  He states he has difficulty putting on his shoes due to the pain in the foot and has been wearing a lot of flip-flops.  Is painful to stand or walk and he points to the plantar  aspect of the foot.  Review of Systems Negative for fevers chills shortness of breath chest pain  Objective: Vital Signs: There were no vitals taken for this visit.  Physical Exam Constitutional:      Appearance: He is not ill-appearing or diaphoretic.  Pulmonary:     Effort: Pulmonary effort is normal.  Neurological:     Mental Status: He is alert and oriented to person, place, and time.  Psychiatric:        Mood and Affect: Mood normal.     Comments: Patient's pain tolerance through out the exam seems to be lower than 1 would expect.     Ortho Exam Bilateral feet no rashes skin lesions ulcerations or impending ulcers.  There is no abnormal warmth erythema or ecchymosis of either foot.  Dorsal pedal pulses are intact bilaterally.  Left foot he has tenderness maximally over the medial tubercle of the calcaneus and also the second and third metatarsal heads.  Some tenderness over the dorsal aspect of the foot over the second third metatarsal head region.  Bilateral lower legs varicose vein  changes including the medial lower ankle.  He has tenderness over the medial ankle where he has a varicosity. Specialty Comments:  No specialty comments available.  Imaging: XR Foot Complete Left  Result Date: 04/09/2019 Left foot 3 views: No acute fractures.  No bony abnormalities.  Os peroneum just lateral to the cuboid.  No dislocation of the right joints.  Haglund's deformity is present.    PMFS History: Patient Active Problem List   Diagnosis Date Noted  . Acute pain of right knee 01/22/2019  . Acute bilateral low back pain without sciatica 12/22/2018  . GERD (gastroesophageal reflux disease) 10/20/2018  . Balanitis 10/14/2018  . Right low back pain 06/17/2018  . COPD exacerbation ([place]) 04/16/2018  . Chronic low back pain without sciatica 04/16/2018  . Closed non-physeal fracture of fifth metatarsal bone of right foot 11/25/2017  . Pulmonary nodules/lesions, multiple 09/10/2017  .  Pulmonary emboli ([place]) 09/05/2017  . Snoring 04/23/2017  . Erectile dysfunction 08/16/2016  . Frequent No-show for appointment 07/20/2016  . Tobacco use disorder 06/14/2016  . Syncope and collapse 12/27/2015  . Diabetes mellitus without complication ([place]) 43/32/9518  . Chronic obstructive airway disease ([place]) 12/23/2015  . Essential hypertension 12/15/2015   Past Medical History:  Diagnosis Date  . Arthritis   . COPD (chronic obstructive pulmonary disease) ([place])   . Diabetes mellitus without complication ([place])   . Gout   . Hypertension     Family History  Problem Relation Age of Onset  . Deep vein thrombosis Brother   . Pulmonary embolism Brother   . Cancer - Lung Father   . Cancer Sister        breast  . Cancer Sister        throat  . Other Sister        back problems  . Heart disease Sister     Past Surgical History:  Procedure Laterality Date  . APPENDECTOMY    . HERNIA REPAIR     Social History   Occupational History  . Not on file  Tobacco Use  . Smoking status: Current Every Day Smoker    Packs/day: 1.00    Years: 48.00    Pack years: 48.00    Types: Cigarettes  . Smokeless tobacco: Never Used  . Tobacco comment: Works at tobacco plant  Substance and Sexual Activity  . Alcohol use: Yes    Alcohol/week: 1.0 standard drinks    Types: 1 Cans of beer per week    Comment: "moderately"  . Drug use: No  . Sexual activity: Never

## 2019-05-04 ENCOUNTER — Other Ambulatory Visit: Payer: Self-pay | Admitting: Family Medicine

## 2019-05-27 ENCOUNTER — Other Ambulatory Visit: Payer: Self-pay

## 2019-05-27 MED ORDER — HYDROCHLOROTHIAZIDE 25 MG PO TABS: ORAL_TABLET | ORAL | 3 refills | Status: DC

## 2019-05-27 MED ORDER — METFORMIN HCL 1000 MG PO TABS: ORAL_TABLET | ORAL | 1 refills | Status: DC

## 2019-05-27 MED ORDER — RIVAROXABAN 20 MG PO TABS: ORAL_TABLET | ORAL | 0 refills | Status: DC

## 2019-06-01 ENCOUNTER — Other Ambulatory Visit: Payer: Self-pay

## 2019-06-01 NOTE — Telephone Encounter (Signed)
Per chart review I do not see treatment by our office for hemorrhoids. There is a note from 09/2017 regarding referral to GI for this. I agree, patient needs an appointment for prescription treatment. I do not see any RX on file for this. Please tell patient that he can use OTC preparation H for treatment until he can be seen. Can be with any provider or ATC.   I have double checked with preceptor as well who agrees patient needs in person appointment.   Orpah Clinton, PGY-3 [facility] 06/01/2019 12:16 PM

## 2019-06-01 NOTE — Telephone Encounter (Signed)
Patient calls nurse line stating he needs relief from his hemorrhoids. Patient stated he can not come into the office, as he has no money to get here or pay copay. Patient stated a cream and suppositories have been called in in the past for patient. Patient stated he can barely walk due to the swelling. I advised patient an apt would be best, however will forward to provider.

## 2019-06-01 NOTE — Telephone Encounter (Signed)
Called patient and made him an appointment.  Patient states that Preparation H does nothing for nothing.  Glennie Hawk, CMA

## 2019-06-08 ENCOUNTER — Other Ambulatory Visit: Payer: Self-pay | Admitting: *Deleted

## 2019-06-08 MED ORDER — LISINOPRIL 40 MG PO TABS: ORAL_TABLET | ORAL | 3 refills | Status: DC

## 2019-06-09 ENCOUNTER — Telehealth (INDEPENDENT_AMBULATORY_CARE_PROVIDER_SITE_OTHER): Payer: Medicare Other | Admitting: Family Medicine

## 2019-06-09 ENCOUNTER — Other Ambulatory Visit: Payer: Self-pay

## 2019-06-09 DIAGNOSIS — J449 Chronic obstructive pulmonary disease, unspecified: Secondary | ICD-10-CM

## 2019-06-09 DIAGNOSIS — J441 Chronic obstructive pulmonary disease with (acute) exacerbation: Secondary | ICD-10-CM

## 2019-06-09 DIAGNOSIS — N529 Male erectile dysfunction, unspecified: Secondary | ICD-10-CM | POA: Diagnosis not present

## 2019-06-09 MED ORDER — AZITHROMYCIN 250 MG PO TABS: ORAL_TABLET | ORAL | 0 refills | Status: DC

## 2019-06-09 MED ORDER — ALBUTEROL SULFATE HFA 108 (90 BASE) MCG/ACT IN AERS: 2.0000 | INHALATION_SPRAY | Freq: Four times a day (QID) | RESPIRATORY_TRACT | 2 refills | Status: DC | PRN

## 2019-06-09 MED ORDER — SILDENAFIL CITRATE 20 MG PO TABS: 20.0000 mg | ORAL_TABLET | Freq: Every day | ORAL | 0 refills | Status: DC | PRN

## 2019-06-09 MED ORDER — LISINOPRIL 40 MG PO TABS: ORAL_TABLET | ORAL | 3 refills | Status: DC

## 2019-06-09 MED ORDER — PREDNISONE 20 MG PO TABS: 40.0000 mg | ORAL_TABLET | Freq: Every day | ORAL | 0 refills | Status: AC

## 2019-06-09 NOTE — Progress Notes (Signed)
[name] Telemedicine Visit I connected with  [name] on 06/10/19 by a video enabled telemedicine application and verified that I am speaking with the correct person using two identifiers.   I discussed the limitations of evaluation and management by telemedicine. The patient expressed understanding and agreed to proceed.   Patient consented to have virtual visit. Method of visit: Video  Encounter participants: Patient: Ernest White - located at Home Provider: Oralia Manis - located at [facility] Others (if applicable): None  Chief Complaint: concern for COPD exacerbation   HPI:  Concern for COPD exacerbation  Patient reports he thinks he is having a COPD exacerbation. Was diagnosed with COPD. At first he thought it was a cold. He has been having white sputum production. Is having muscle pain around his lungs due to coughing. Hard time trying to breath x1 week. Has tried aerosol spray for his COPD but ran out because he is using it a lot. Is complaint on symbicort and spiriva. Reports having to sit up as this helps his cough. Feels warm but no temperature. In the past steroids have helped.   Pharmacy recently switched due to insurance issues.   ROS: per HPI  Pertinent PMHx: HTN, GERD, T2DM, COPD, tobacco use   Exam:  Respiratory: speaking full sentences, no increased WOB  Assessment/Plan:  COPD exacerbation (HCC) Patient with symptoms consistent with acute exacerbation. Unable to do lung exam as this was a virtual visit. Patient was able to have normal conversation with full sentences and no increased work of breathing during video visit. Otherwise afebrile and no symptoms of loss of taste or smell, muscle aches, GI symptoms which would make me think coronavirus. Will treat with steroid burst and abx. Patient understanding of this and understanding of when to return.  - Prednisone 40mg  qd x5d - Azithro (z-pack) x5d - Return precautions given,  patient voiced understanding - Discussed with patient that if he has worsening symptoms may need to go to urgent care for chest x-ray and Covid test. If significant shortness of breath advised to go to the emergency department. -Continue all other COPD medications   Erectile dysfunction Refilled sildenafil for patient. Follow-up if no improvement.    Time spent during visit with patient: 15 minutes

## 2019-06-10 ENCOUNTER — Encounter: Payer: Self-pay | Admitting: Family Medicine

## 2019-06-10 ENCOUNTER — Telehealth: Payer: Self-pay

## 2019-06-10 ENCOUNTER — Telehealth (INDEPENDENT_AMBULATORY_CARE_PROVIDER_SITE_OTHER): Payer: Medicare Other | Admitting: Family Medicine

## 2019-06-10 ENCOUNTER — Other Ambulatory Visit: Payer: Self-pay

## 2019-06-10 DIAGNOSIS — K649 Unspecified hemorrhoids: Secondary | ICD-10-CM

## 2019-06-10 DIAGNOSIS — R432 Parageusia: Secondary | ICD-10-CM

## 2019-06-10 DIAGNOSIS — K148 Other diseases of tongue: Secondary | ICD-10-CM

## 2019-06-10 DIAGNOSIS — K1370 Unspecified lesions of oral mucosa: Secondary | ICD-10-CM | POA: Diagnosis not present

## 2019-06-10 DIAGNOSIS — K6289 Other specified diseases of anus and rectum: Secondary | ICD-10-CM

## 2019-06-10 MED ORDER — HYDROCORTISONE (PERIANAL) 2.5 % EX CREA: 1.0000 "application " | TOPICAL_CREAM | Freq: Two times a day (BID) | CUTANEOUS | 2 refills | Status: DC

## 2019-06-10 NOTE — Assessment & Plan Note (Signed)
Metallic, likely secondary to prednisone which he started yesterday.  Discussed that is appropriate for him to continue this due to COPD exacerbation and use mints/lozenges to help preoccupy from the taste.  Low concern for this being a symptom of Covid given characteristic and sudden onset after starting steroids.

## 2019-06-10 NOTE — Assessment & Plan Note (Addendum)
Patient with symptoms consistent with acute exacerbation. Unable to do lung exam as this was a virtual visit. Patient was able to have normal conversation with full sentences and no increased work of breathing during video visit. Otherwise afebrile and no symptoms of loss of taste or smell, muscle aches, GI symptoms which would make me think coronavirus. Will treat with steroid burst and abx. Patient understanding of this and understanding of when to return.  - Prednisone 40mg  qd x5d - Azithro (z-pack) x5d - Return precautions given, patient voiced understanding - Discussed with patient that if he has worsening symptoms may need to go to urgent care for chest x-ray and Covid test. If significant shortness of breath advised to go to the emergency department. -Continue all other COPD medications. Albuterol refilled

## 2019-06-10 NOTE — Progress Notes (Signed)
 [facility] Telemedicine Visit  Patient consented to have virtual visit. Method of visit: Video was attempted, but technology challenges prevented patient from using video, so visit was conducted via telephone.  Encounter participants: Patient: Ernest White - located at Home  Provider: Allayne Stack - located at [facility] Others (if applicable):None   Chief Complaint: Follow-up brown spots on tongue, change in taste  HPI: Ernest White is a 64 year old gentleman presenting via virtual appointment to discuss the following:  Change in taste/tongue spots: Started this morning. Reports "big dark brown spot" on the left lateral side of his tongue that he saw this morning. Alittle bigger than nickel. He is sure it just showed up today, hasn't seen it before when brushing his teeth. Non-painful. No difficulty swallowing, tongue swelling, sore throat, difficulty breathing (above current COPD exacerbation), or other oral lesions. No white lesions. Also states he has a metallic taste in his mouth that started after taking the new medicines (discussed below) yesterday.   Of note, he was evaluated via telemedicine yesterday with concerns for COPD exacerbation.  He was prescribed azithromycin with prednisone 5-day course.  He also reports frustration over chronic hemorrhoids for the past several years.  States he has been evaluated for this several times before and has been told that he does not have hemorrhoids.  States he will occasionally feel something pushing out that he has to push back in.  Has rectal itching and pain with blood on his outer stool.  Has used Preparation H and lidocaine in the past without any benefit, used to get suppositories but his insurance is not covering this as of now.  ROS: per HPI  Pertinent PMHx: History of PE, Syncope, COPD with current exacerbation, HTN, GERD, T2DM, ED, chronic back pain    Exam:  Respiratory: Unlabored breathing, speaking in  full sentences without coughing      Assessment/Plan:  Dysgeusia Metallic, likely secondary to prednisone which he started yesterday.  Discussed that is appropriate for him to continue this due to COPD exacerbation and use mints/lozenges to help preoccupy from the taste.  Low concern for this being a symptom of Covid given characteristic and sudden onset after starting steroids.  Tongue lesion Reports acute onset of a "nickel sized" brown lesion on his lateral tongue this morning.  Etiology of lesion unclear, unfortunately not able to visualize. Reassuringly without any concurrent tongue or oropharyngeal swelling to suggest allergic reaction to azithromycin.  Considered oral candidiasis with current oral prednisone and steroid inhalers, however description of appearance does not seem to fit this.  Low concern for cancerous lesion, however could remain on differential if this region has been slow-growing and not noticeable until today.  Recommended patient send a photo of this area via MyChart for improved evaluation.  Will monitor for now.  Hemorrhoids Chronic.  Sent in Anusol rectal cream to help alleviate irritation.  Recommended follow-up with PCP for further evaluation of hemorrhoids in the future as he continues to struggle with this.    Update: Viewed image sent by patient later this afternoon.  Unfortunately blurry and unable to see any lesion on his tongue.  Recommended retrying for a more clear image or scheduling an in person visit (or re-try video but would likely have same difficulty) once his respiratory symptoms have resolved.  Time spent during visit with patient: 14 minutes  Allayne Stack, DO

## 2019-06-10 NOTE — Assessment & Plan Note (Signed)
Refilled sildenafil for patient. Follow-up if no improvement.

## 2019-06-10 NOTE — Telephone Encounter (Signed)
Patient calls nurse line regarding brown spots on tongue and having "funny taste in mouth". Patient had virtual visit yesterday with PCP and was prescribed azithromycin and prednisone. Patient reports onset starting this morning after waking up. Patient denies difficulty swallowing, difficulty breathing or swelling of the throat. Patient does report change in taste. Normal sense of smell. Patient denies known COVID exposure. Patient is to receive first COVID vaccine on Monday  Discussed with Dr. Darin Engels, advised patient to schedule virtual follow up appointment. Virtual appointment scheduled for this afternoon with Dr. Annia Friendly.   Strict ED precautions given.   To PCP and Dr. Annia Friendly

## 2019-06-11 DIAGNOSIS — K148 Other diseases of tongue: Secondary | ICD-10-CM | POA: Insufficient documentation

## 2019-06-11 NOTE — Assessment & Plan Note (Signed)
Chronic.  Sent in Anusol rectal cream to help alleviate irritation.  Recommended follow-up with PCP for further evaluation of hemorrhoids in the future as he continues to struggle with this.

## 2019-06-11 NOTE — Assessment & Plan Note (Addendum)
Reports acute onset of a "nickel sized" brown lesion on his lateral tongue this morning.  Etiology of lesion unclear, unfortunately not able to visualize. Reassuringly without any concurrent tongue or oropharyngeal swelling to suggest allergic reaction to azithromycin.  Considered oral candidiasis with current oral prednisone and steroid inhalers, however description of appearance does not seem to fit this.  Low concern for cancerous lesion, however could remain on differential if this region has been slow-growing and not noticeable until today.  Recommended patient send a photo of this area via MyChart for improved evaluation.  Will monitor for now.

## 2019-06-15 ENCOUNTER — Ambulatory Visit: Payer: Medicaid Other | Admitting: Family Medicine

## 2019-06-16 NOTE — Progress Notes (Signed)
SUBJECTIVE:   CHIEF COMPLAINT / HPI:   Hemorrhoids  Patient presenting for concerns of hemorrhoids. States they have been occurring for years now. States they never go away or improve. States he did have a colonoscopy last year and was told they could not remove them. States they are bleeding "a lot". States he is "tired of suffering". States he can't sit down due to pain. States he can't wear white due to bleeding. States area continues to be painful and itch. States nothing works. Has tried prescription strength creams and suppositories without relief. Suppositories work better than creams. Is having daily BMs, sometimes can occur multiple times a day.   Diabetes Fasting checks: 130s  Post prandial 130-140   Compliance: good  Diet: eating a lot of nuts, eating a lot of peanut butter Exercise: daily walks Eye exam: due Foot exam: deferred  A1C: 8.5 Symptoms: no symptoms of hypoglycemia. no symptoms of  polyuria, polydipsia. no numbness in extremities, and no foot ulcers/trauma Meds: metformin 1000mg  bid, jardiance 10mg   Pneumonia vaccine: due  Urine micro albumin:creatine ratio: on ACEI Statin: none  Monitoring Labs and Parameters Last A1C:  Lab Results  Component Value Date   HGBA1C 8.5 (A) 06/17/2019   Last Lipid:     Component Value Date/Time   CHOL 147 09/06/2017 0356   HDL 45 09/06/2017 0356   Last Bmet  Potassium  Date Value Ref Range Status  10/20/2018 3.6 3.5 - 5.2 mmol/L Final   Sodium  Date Value Ref Range Status  10/20/2018 139 134 - 144 mmol/L Final   Creat  Date Value Ref Range Status  12/15/2015 1.14 0.70 - 1.25 mg/dL Final    Comment:      For patients > or = 65 years of age: The upper reference limit for Creatinine is approximately 13% higher for people identified as African-American.      Creatinine, Ser  Date Value Ref Range Status  10/20/2018 1.11 0.76 - 1.27 mg/dL Final      PERTINENT  PMH / PSH: Hemorrhoids, HTN, COPD, GERD, DM,     OBJECTIVE:   BP 120/64   Pulse 98   Wt 298 lb (135.2 kg)   SpO2 94%   BMI 41.56 kg/m   Gen: awake and alert, NAD  Cardio: RRR, no MRG Resp: CTAB, no wheezes, rales, or rhonchi  GI: soft, non tender, non distended, bowel sounds present Ext: no edema Rectal exam:via anoscopy. tenderness noted throughout exam, internal hemorrhoids noted, external hemorrhoids noted, sphincter tone normal. Chaperone present   ASSESSMENT/PLAN:   Hemorrhoids Worsening symptoms. Prescription strength creams and suppositories not helping. Patient hemorrhoids noted on exam with tenderness as well. Given severity of symptoms will place urgent referral to colorectal surgeon for possible surgical fixation. Phone number provided as well. Patient plans to call to schedule this appointment. F/u if no improvement after surgery. Will provide Anusol suppositories to help provide some relief prior to surgery   Diabetes mellitus without complication (HCC) Uncontrolled. A1C elevated. Will plan to increase jardiance. Discussed healthy diabetic diet as well. Advised to avoid peanut butter due to increased sugar, will try sugar free peanut butter. F/u in 1 month. Advised to continue daily exercise. Can consider nutrition follow up if patient desires.   Morbid obesity (HCC) Patient with elevated BMI and co-morbid conditions of diabetes and HTN. Meets criteria for bariatric surgery. Patient is very interested in this as well. Referral placed.      44, DO Electra  [place]

## 2019-06-17 ENCOUNTER — Other Ambulatory Visit: Payer: Self-pay

## 2019-06-17 ENCOUNTER — Ambulatory Visit (INDEPENDENT_AMBULATORY_CARE_PROVIDER_SITE_OTHER): Payer: Medicare Other | Admitting: Family Medicine

## 2019-06-17 ENCOUNTER — Encounter: Payer: Self-pay | Admitting: Family Medicine

## 2019-06-17 VITALS — BP 120/64 | HR 98 | Wt 298.0 lb

## 2019-06-17 DIAGNOSIS — E119 Type 2 diabetes mellitus without complications: Secondary | ICD-10-CM | POA: Diagnosis not present

## 2019-06-17 DIAGNOSIS — K649 Unspecified hemorrhoids: Secondary | ICD-10-CM

## 2019-06-17 LAB — POCT GLYCOSYLATED HEMOGLOBIN (HGB A1C): HbA1c, POC (controlled diabetic range): 8.5 % — AB (ref 0.0–7.0)

## 2019-06-17 MED ORDER — HYDROCORTISONE ACETATE 25 MG RE SUPP: 25.0000 mg | Freq: Two times a day (BID) | RECTAL | 0 refills | Status: DC

## 2019-06-17 MED ORDER — EMPAGLIFLOZIN 25 MG PO TABS: 25.0000 mg | ORAL_TABLET | Freq: Every day | ORAL | 3 refills | Status: DC

## 2019-06-17 NOTE — Patient Instructions (Signed)
  Diet Recommendations for Diabetes   Starchy (carb) foods: Bread, rice, pasta, potatoes, corn, cereal, grits, crackers, bagels, muffins, all baked goods.  (Fruits, milk, and yogurt also have carbohydrate, but most of these foods will not spike your blood sugar as the starchy foods will.)  A few fruits do cause high blood sugars; use small portions of bananas (limit to 1/2 at a time), grapes, watermelon, oranges, and most tropical fruits.    Protein foods: Meat, fish, poultry, eggs, dairy foods, and beans such as pinto and kidney beans (beans also provide carbohydrate).   1. Eat at least 3 meals and 1-2 snacks per day. Never go more than 4-5 hours while awake without eating. Eat breakfast within the first hour of getting up.   2. Limit starchy foods to TWO per meal and ONE per snack. ONE portion of a starchy  food is equal to the following:   - ONE slice of bread (or its equivalent, such as half of a hamburger bun).   - 1/2 cup of a "scoopable" starchy food such as potatoes or rice.   - 15 grams of carbohydrate as shown on food label.  3. Include at every meal: a protein food, a carb food, and vegetables and/or fruit.   - Obtain twice the volume of veg's as protein or carbohydrate foods for both lunch and dinner.   - Fresh or frozen veg's are best.   - Keep frozen veg's on hand for a quick vegetable serving.        For your diabetes 1. I have increased your jardiance to 25mg  daily  2. If you would like a referral to a nutritionist let me know and I would be happy to set this up   For your obesity 1. I have referred you to a bariatric surgeon   For your hemorrhoids 1: I have sent in suppositories  2. I have referred you to a surgeon, they will call you soon 3. Here is the number for [facility]: [phone], you can also call them to schedule this  Follow up in 1 month if hemorrhoids still bother you. Follow up in 3 months for diabetes

## 2019-06-17 NOTE — Assessment & Plan Note (Signed)
Uncontrolled. A1C elevated. Will plan to increase jardiance. Discussed healthy diabetic diet as well. Advised to avoid peanut butter due to increased sugar, will try sugar free peanut butter. F/u in 1 month. Advised to continue daily exercise. Can consider nutrition follow up if patient desires.

## 2019-06-17 NOTE — Assessment & Plan Note (Signed)
Worsening symptoms. Prescription strength creams and suppositories not helping. Patient hemorrhoids noted on exam with tenderness as well. Given severity of symptoms will place urgent referral to colorectal surgeon for possible surgical fixation. Phone number provided as well. Patient plans to call to schedule this appointment. F/u if no improvement after surgery. Will provide Anusol suppositories to help provide some relief prior to surgery

## 2019-06-17 NOTE — Assessment & Plan Note (Signed)
Patient with elevated BMI and co-morbid conditions of diabetes and HTN. Meets criteria for bariatric surgery. Patient is very interested in this as well. Referral placed.

## 2019-06-25 ENCOUNTER — Telehealth: Payer: Self-pay

## 2019-06-25 NOTE — Telephone Encounter (Signed)
Patient calls nurse line regarding hemorrhoid management. Patient states that the creams do not work and that suppositories work best. Patient has rx for [employer] suppositories, however, insurance will not pay for the suppository. Pharmacy called and states that medication will cost 191.99 per 12 count box.   Veronda Prude, RN

## 2019-06-26 ENCOUNTER — Encounter: Payer: Self-pay | Admitting: Family Medicine

## 2019-06-26 ENCOUNTER — Other Ambulatory Visit: Payer: Self-pay | Admitting: Family Medicine

## 2019-06-26 ENCOUNTER — Telehealth: Payer: Self-pay | Admitting: Family Medicine

## 2019-06-26 DIAGNOSIS — K6289 Other specified diseases of anus and rectum: Secondary | ICD-10-CM

## 2019-06-26 MED ORDER — HYDROCORTISONE (PERIANAL) 2.5 % EX CREA: 1.0000 "application " | TOPICAL_CREAM | Freq: Two times a day (BID) | CUTANEOUS | 2 refills | Status: AC

## 2019-06-26 NOTE — Telephone Encounter (Signed)
Pt is calling saying he is needing Dr to send in Enema prescription called in because insurance will cover those, but will NOT cover the suppositories. Pt states this is the 3rd time he has called and has also came to office. He is really needing enema because they are getting really bad. Thanks

## 2019-06-26 NOTE — Telephone Encounter (Signed)
I called and discussed with pharmacy. Anusol cream is the only formulation that is covered by insurance for hemorrhoid treatment. I have sent in RX for this as well as 2 refills. This was the first message I received regarding this so I apologize in the delay the patient had in receiving the medication. As I discussed with him at his previous treatment he needs to see a surgeon ASAP and I provided an urgent referral as well as the phone number for [facility] so he could call and schedule an appointment.   Patient does have mychart so I will send him this message as well.   Orpah Clinton, PGY-3 [facility] 06/26/2019 5:53 PM

## 2019-06-30 ENCOUNTER — Telehealth: Payer: Self-pay | Admitting: Family Medicine

## 2019-06-30 NOTE — Telephone Encounter (Signed)
Opened in error

## 2019-06-30 NOTE — Telephone Encounter (Signed)
Patient is calling back concerning the cream and suppository. I informed him again of the message below. Patient began yelling and getting upset. He said "I know that but all she needs to do is change the prescription from the suppository to an enema."  Patient would like for Dr. Darin Engels to call him to discuss. The best call back number is [phone].

## 2019-07-01 ENCOUNTER — Other Ambulatory Visit: Payer: Self-pay | Admitting: Family Medicine

## 2019-07-01 ENCOUNTER — Encounter: Payer: Self-pay | Admitting: Family Medicine

## 2019-07-01 MED ORDER — POLYETHYLENE GLYCOL 3350 17 GM/SCOOP PO POWD: 17.0000 g | Freq: Every day | ORAL | 0 refills | Status: DC

## 2019-07-01 NOTE — Telephone Encounter (Signed)
Called patient to discuss his concerns. Patient's suppositories are not covered by Medicaid.  I have sent in cream.  He reports this has not worked for him in the past and his insurance will not provide enema.  I discussed this with Dr. Manson Passey as well.  An enema is not indicated for treatment of hemorrhoids but only ulcerative colitis.  I fear this will cause him more harm than good.  I discussed this with patient and he is agreeable.  Does endorse some difficulty with stools.  Will give Rx for MiraLAX to be taken daily.  Patient is agreeable with this.  I expressed to him the importance of scheduling with colorectal surgery as this will be a more long-term option.  Patient states that he was told that they would not take his insurance.  I called [facility] and they stated that he has an appointment scheduled for 4/30.  States  that they will check with his billing department and call patient back to ensure that this visit is covered.  Will send mychart message relaying this information to patient  Oralia Manis, DO, PGY-3 [facility] 07/01/2019 8:49 AM

## 2019-07-28 ENCOUNTER — Other Ambulatory Visit: Payer: Self-pay | Admitting: Family Medicine

## 2019-07-28 DIAGNOSIS — N529 Male erectile dysfunction, unspecified: Secondary | ICD-10-CM

## 2019-08-16 ENCOUNTER — Other Ambulatory Visit: Payer: Self-pay | Admitting: Family Medicine

## 2019-08-16 DIAGNOSIS — J449 Chronic obstructive pulmonary disease, unspecified: Secondary | ICD-10-CM

## 2019-08-24 ENCOUNTER — Other Ambulatory Visit: Payer: Self-pay | Admitting: Family Medicine

## 2019-09-13 ENCOUNTER — Other Ambulatory Visit: Payer: Self-pay | Admitting: Family Medicine

## 2019-09-13 DIAGNOSIS — N529 Male erectile dysfunction, unspecified: Secondary | ICD-10-CM

## 2019-09-20 NOTE — Progress Notes (Signed)
  Subjective:  Patient ID: Ernest White, male    DOB: 1955/11/02,  MRN: [id number]  Chief Complaint  Patient presents with  . Foot Pain    Bilateral sub 4/5th painful "spot". 1 year duration.    64 y.o. male presents with the above complaint. History confirmed with patient.   Objective:  Physical Exam: warm, good capillary refill, no trophic changes or ulcerative lesions, normal DP and PT pulses and normal sensory exam.  Punctate keratosis subfourth interspace bilateral  Assessment:   1. Verruca plantaris      Plan:  Patient was evaluated and treated and all questions answered.  Porokeratosis bilat -Educated on etiology -Courtesy paring of lesion -Educated on self-care  Return if symptoms worsen or fail to improve.

## 2019-10-15 ENCOUNTER — Other Ambulatory Visit: Payer: Self-pay | Admitting: Student

## 2019-10-15 DIAGNOSIS — R109 Unspecified abdominal pain: Secondary | ICD-10-CM

## 2019-11-02 ENCOUNTER — Other Ambulatory Visit: Payer: Self-pay | Admitting: *Deleted

## 2019-11-02 DIAGNOSIS — J449 Chronic obstructive pulmonary disease, unspecified: Secondary | ICD-10-CM

## 2019-11-02 MED ORDER — ALBUTEROL SULFATE HFA 108 (90 BASE) MCG/ACT IN AERS: INHALATION_SPRAY | RESPIRATORY_TRACT | 2 refills | Status: DC

## 2019-11-07 ENCOUNTER — Other Ambulatory Visit: Payer: Self-pay | Admitting: Family Medicine

## 2019-11-07 DIAGNOSIS — N529 Male erectile dysfunction, unspecified: Secondary | ICD-10-CM

## 2019-11-16 ENCOUNTER — Other Ambulatory Visit: Payer: Self-pay | Admitting: Family Medicine

## 2019-11-16 DIAGNOSIS — N529 Male erectile dysfunction, unspecified: Secondary | ICD-10-CM

## 2019-11-30 ENCOUNTER — Other Ambulatory Visit: Payer: Self-pay

## 2019-11-30 MED ORDER — RIVAROXABAN 20 MG PO TABS
ORAL_TABLET | ORAL | 0 refills | Status: DC
Start: 2019-11-30 — End: 2020-02-23

## 2020-02-16 ENCOUNTER — Other Ambulatory Visit: Payer: Self-pay | Admitting: Family Medicine

## 2020-02-16 DIAGNOSIS — M545 Low back pain, unspecified: Secondary | ICD-10-CM

## 2020-02-22 ENCOUNTER — Other Ambulatory Visit: Payer: Self-pay | Admitting: Family Medicine

## 2020-05-10 ENCOUNTER — Other Ambulatory Visit: Payer: Self-pay

## 2020-05-11 MED ORDER — HYDROCHLOROTHIAZIDE 25 MG PO TABS: ORAL_TABLET | ORAL | 1 refills | Status: AC

## 2020-06-03 ENCOUNTER — Other Ambulatory Visit: Payer: Self-pay

## 2020-06-03 MED ORDER — EMPAGLIFLOZIN 25 MG PO TABS
25.0000 mg | ORAL_TABLET | Freq: Every day | ORAL | 3 refills | Status: DC
Start: 2020-06-03 — End: 2021-08-29

## 2020-07-01 ENCOUNTER — Ambulatory Visit
Admission: RE | Admit: 2020-07-01 | Discharge: 2020-07-01 | Disposition: A | Discharge status: Home / Self Care | Payer: Medicare Other | Source: Ambulatory Visit | Attending: Family Medicine | Admitting: Family Medicine

## 2020-07-01 ENCOUNTER — Other Ambulatory Visit: Payer: Self-pay | Admitting: Family Medicine

## 2020-07-01 DIAGNOSIS — M5489 Other dorsalgia: Secondary | ICD-10-CM

## 2020-07-25 ENCOUNTER — Other Ambulatory Visit: Payer: Self-pay | Admitting: *Deleted

## 2020-07-25 DIAGNOSIS — E119 Type 2 diabetes mellitus without complications: Secondary | ICD-10-CM

## 2020-07-25 MED ORDER — ACCU-CHEK AVIVA PLUS VI STRP: ORAL_STRIP | 2 refills | Status: DC

## 2020-09-01 ENCOUNTER — Other Ambulatory Visit: Payer: Self-pay | Admitting: Family Medicine

## 2020-09-05 ENCOUNTER — Other Ambulatory Visit: Payer: Self-pay

## 2020-09-05 MED ORDER — LISINOPRIL 40 MG PO TABS: ORAL_TABLET | ORAL | 3 refills | Status: DC

## 2020-10-11 ENCOUNTER — Emergency Department (HOSPITAL_COMMUNITY): Payer: Medicare Other

## 2020-10-11 ENCOUNTER — Encounter (HOSPITAL_COMMUNITY): Payer: Self-pay | Admitting: Emergency Medicine

## 2020-10-11 ENCOUNTER — Emergency Department (HOSPITAL_COMMUNITY)
Admission: EM | Admit: 2020-10-11 | Discharge: 2020-10-11 | Disposition: A | Discharge status: Home / Self Care | Payer: Medicare Other | Attending: Emergency Medicine | Admitting: Emergency Medicine

## 2020-10-11 DIAGNOSIS — F17210 Nicotine dependence, cigarettes, uncomplicated: Secondary | ICD-10-CM | POA: Insufficient documentation

## 2020-10-11 DIAGNOSIS — Z7984 Long term (current) use of oral hypoglycemic drugs: Secondary | ICD-10-CM | POA: Insufficient documentation

## 2020-10-11 DIAGNOSIS — J449 Chronic obstructive pulmonary disease, unspecified: Secondary | ICD-10-CM | POA: Insufficient documentation

## 2020-10-11 DIAGNOSIS — R5383 Other fatigue: Secondary | ICD-10-CM | POA: Insufficient documentation

## 2020-10-11 DIAGNOSIS — K118 Other diseases of salivary glands: Secondary | ICD-10-CM | POA: Diagnosis not present

## 2020-10-11 DIAGNOSIS — I10 Essential (primary) hypertension: Secondary | ICD-10-CM | POA: Diagnosis not present

## 2020-10-11 DIAGNOSIS — E119 Type 2 diabetes mellitus without complications: Secondary | ICD-10-CM | POA: Diagnosis not present

## 2020-10-11 DIAGNOSIS — Z79899 Other long term (current) drug therapy: Secondary | ICD-10-CM | POA: Diagnosis not present

## 2020-10-11 DIAGNOSIS — K1120 Sialoadenitis, unspecified: Secondary | ICD-10-CM

## 2020-10-11 DIAGNOSIS — J029 Acute pharyngitis, unspecified: Secondary | ICD-10-CM | POA: Diagnosis present

## 2020-10-11 DIAGNOSIS — R5381 Other malaise: Secondary | ICD-10-CM | POA: Diagnosis not present

## 2020-10-11 LAB — CBC WITH DIFFERENTIAL/PLATELET
Abs Immature Granulocytes: 0.02 10*3/uL (ref 0.00–0.07)
Basophils Absolute: 0 10*3/uL (ref 0.0–0.1)
Basophils Relative: 0 %
Eosinophils Absolute: 0.1 10*3/uL (ref 0.0–0.5)
Eosinophils Relative: 1 %
HCT: 38.8 % — ABNORMAL LOW (ref 39.0–52.0)
Hemoglobin: 12.5 g/dL — ABNORMAL LOW (ref 13.0–17.0)
Immature Granulocytes: 0 %
Lymphocytes Relative: 43 %
Lymphs Abs: 3 10*3/uL (ref 0.7–4.0)
MCH: 28.2 pg (ref 26.0–34.0)
MCHC: 32.2 g/dL (ref 30.0–36.0)
MCV: 87.6 fL (ref 80.0–100.0)
Monocytes Absolute: 0.5 10*3/uL (ref 0.1–1.0)
Monocytes Relative: 7 %
Neutro Abs: 3.4 10*3/uL (ref 1.7–7.7)
Neutrophils Relative %: 49 %
Platelets: 218 10*3/uL (ref 150–400)
RBC: 4.43 MIL/uL (ref 4.22–5.81)
RDW: 15.8 % — ABNORMAL HIGH (ref 11.5–15.5)
WBC: 7 10*3/uL (ref 4.0–10.5)
nRBC: 0 % (ref 0.0–0.2)

## 2020-10-11 LAB — BASIC METABOLIC PANEL
Anion gap: 10 (ref 5–15)
BUN: 19 mg/dL (ref 8–23)
CO2: 27 mmol/L (ref 22–32)
Calcium: 9.7 mg/dL (ref 8.9–10.3)
Chloride: 98 mmol/L (ref 98–111)
Creatinine, Ser: 1 mg/dL (ref 0.61–1.24)
GFR, Estimated: >60 mL/min (ref >60)
Glucose, Bld: 253 mg/dL — ABNORMAL HIGH (ref 70–99)
Potassium: 3.6 mmol/L (ref 3.5–5.1)
Sodium: 135 mmol/L (ref 135–145)

## 2020-10-11 LAB — GROUP A STREP BY PCR: Group A Strep by PCR: NOT DETECTED

## 2020-10-11 MED ORDER — MORPHINE SULFATE (PF) 4 MG/ML IV SOLN: 4.0000 mg | Freq: Once | INTRAVENOUS | Status: DC

## 2020-10-11 MED ORDER — ACETAMINOPHEN 325 MG PO TABS: 650.0000 mg | ORAL_TABLET | Freq: Four times a day (QID) | ORAL | 0 refills | Status: DC | PRN

## 2020-10-11 MED ORDER — HYDROMORPHONE HCL 1 MG/ML IJ SOLN
1.0000 mg | Freq: Once | INTRAMUSCULAR | Status: AC
Start: 2020-10-11 — End: 2020-10-11
  Administered 2020-10-11: 1 mg via INTRAVENOUS
  Filled 2020-10-11: qty 1

## 2020-10-11 MED ORDER — KETOROLAC TROMETHAMINE 30 MG/ML IJ SOLN
30.0000 mg | Freq: Once | INTRAMUSCULAR | Status: AC
  Administered 2020-10-11: 30 mg via INTRAVENOUS
  Filled 2020-10-11: qty 1

## 2020-10-11 MED ORDER — CLINDAMYCIN HCL 150 MG PO CAPS: 300.0000 mg | ORAL_CAPSULE | Freq: Three times a day (TID) | ORAL | 0 refills | Status: AC

## 2020-10-11 MED ORDER — CLINDAMYCIN HCL 150 MG PO CAPS
450.0000 mg | ORAL_CAPSULE | Freq: Once | ORAL | Status: AC
  Administered 2020-10-11: 450 mg via ORAL
  Filled 2020-10-11: qty 3

## 2020-10-11 MED ORDER — IOHEXOL 350 MG/ML SOLN
75.0000 mL | Freq: Once | INTRAVENOUS | Status: AC | PRN
  Administered 2020-10-11: 75 mL via INTRAVENOUS

## 2020-10-11 MED ORDER — OXYCODONE HCL 5 MG PO TABS: 5.0000 mg | ORAL_TABLET | Freq: Four times a day (QID) | ORAL | 0 refills | Status: DC | PRN

## 2020-10-11 NOTE — Discharge Instructions (Addendum)
You have swelling and inflammation under your jaw near your submandibular gland.  I put you on antibiotics for this.  It is extremely important that you call the ENT office to schedule a follow up appointment.  This condition needs the attention of a specialist.  You also very likely have sleep apnea, which makes it difficult to breathe at night.  This may leave you feeling very tired and winded in the daytime.  I prescribed a few tablets of stronger pain medicine to take in the daytime only.  Please do not take these at night, as I do not want to slow down your breathing when you fall asleep.  *  If you have worsening swelling in your throat, choking, difficulty breathing, difficulty swallowing water, keep please come back to the ER.

## 2020-10-11 NOTE — ED Notes (Signed)
Patient transported to CT 

## 2020-10-11 NOTE — ED Triage Notes (Signed)
Pt here from home with c/o right side neck swelling ,nad but does hurt to swallow , right side is slightly red and warm tot ouch

## 2020-10-11 NOTE — ED Provider Notes (Signed)
Emergency Medicine Provider Triage Evaluation Note  Ernest White , a 65 y.o. male  was evaluated in triage.  Pt complains of right-sided neck pain and swelling.  Symptoms have been intermittent for the past few weeks but got worse yesterday.  States that he has trouble breathing when he lays down as well as trouble swallowing.  Also reporting a sore throat.  Denies any fever, chills, cough or neck stiffness.  Review of Systems  Positive: Sore throat, neck swelling Negative: Neck stiffness, fever  Physical Exam  BP (!) 164/108   Pulse 96   Temp 98.5 F (36.9 C) (Oral)   Resp 16   SpO2 96%  Gen:   Awake, no distress   Resp:  Normal effort  MSK:   Moves extremities without difficulty  Other:  No dental abscess.  Tenderness palpation of the right lateral neck area with some swelling noted.  Tolerating secretions without trismus or drooling  Medical Decision Making  Medically screening exam initiated at 9:40 AM.  Appropriate orders placed.  Ernest White was informed that the remainder of the evaluation will be completed by another provider, this initial triage assessment does not replace that evaluation, and the importance of remaining in the ED until their evaluation is complete.  Lab work and imaging ordered   Dietrich Pates, PA-C 10/11/20 5621    Terald Sleeper, MD 10/11/20 [phone]

## 2020-10-11 NOTE — ED Provider Notes (Signed)
[facility] EMERGENCY DEPARTMENT Provider Note   CSN: [id number] Arrival date & time: 10/11/20  2671     History CC: Throat pain   Ernest White is a 65 y.o. male with history of type 2 diabetes, obesity, hypertension, COPD (former smoker, quit 3 months ago, not on home oxygen).  Presented to the ED with soreness in his throat, swelling in his lower mandible, and generalized fatigue.  He reports symptoms of been ongoing for several weeks.  He says has not been able to get any good sleep.  He reports that he has pain and swelling in his throat, worse with swallowing.  He feels he cannot breathe well.  He lives by himself, reports that he snores, is unaware of any diagnosis of sleep apnea.  He does not wear CPAP at night.  He reports that he has had all vaccinations with COVID-vaccine, with a booster 3 weeks ago.  He describes general malaise and fatigue in the daytime, ongoing for weeks.  HPI     Past Medical History:  Diagnosis Date   Arthritis    COPD (chronic obstructive pulmonary disease) ([place])    Diabetes mellitus without complication ([place])    Gout    Hypertension     Patient Active Problem List   Diagnosis Date Noted   Morbid obesity ([place]) 06/17/2019   Dysgeusia 06/10/2019   GERD (gastroesophageal reflux disease) 10/20/2018   Chronic low back pain without sciatica 04/16/2018   Pulmonary nodules/lesions, multiple 09/10/2017   Pulmonary emboli ([place]) 09/05/2017   Snoring 04/23/2017   Erectile dysfunction 08/16/2016   Tobacco use disorder 06/14/2016   Hemorrhoids 06/03/2016   Diabetes mellitus without complication ([place]) 24/58/0998   Chronic obstructive airway disease ([place]) 12/23/2015   Essential hypertension 12/15/2015    Past Surgical History:  Procedure Laterality Date   APPENDECTOMY     HERNIA REPAIR         Family History  Problem Relation Age of Onset   Deep vein thrombosis Brother    Pulmonary embolism Brother    Cancer - Lung  Father    Cancer Sister        breast   Cancer Sister        throat   Other Sister        back problems   Heart disease Sister     Social History   Tobacco Use   Smoking status: Every Day    Packs/day: 1.00    Years: 48.00    Pack years: 48.00    Types: Cigarettes   Smokeless tobacco: Never   Tobacco comments:    Works at [employer], therapeutic Use: Never used  Substance Use Topics   Alcohol use: Yes    Alcohol/week: 1.0 standard drink    Types: 1 Cans of beer per week    Comment: "moderately"   Drug use: No    Home Medications Prior to Admission medications   Medication Sig Start Date End Date Taking? Authorizing Provider  acetaminophen (TYLENOL) 325 MG tablet Take 2 tablets (650 mg total) by mouth every 6 (six) hours as needed for up to 30 doses for moderate pain. 10/11/20  Yes Wyvonnia Dusky, MD  albuterol (VENTOLIN HFA) 108 (90 Base) MCG/ACT inhaler INHALE 2 PUFFS INTO THE LUNGS EVERY 6 HOURS AS NEEDED FOR WHEEZING OR SHORTNESS OF BREATH 11/02/19  Yes Simmons-Robinson, Makiera, MD  clindamycin (CLEOCIN) 150 MG capsule Take 2 capsules (300 mg total) by  mouth 3 (three) times daily for 10 days. 10/11/20 10/21/20 Yes Adah Stoneberg, Carola Rhine, MD  empagliflozin (JARDIANCE) 25 MG TABS tablet Take 1 tablet (25 mg total) by mouth daily. 06/03/20  Yes Brimage, Vondra, DO  hydrochlorothiazide (HYDRODIURIL) 25 MG tablet TAKE 1 TABLET(25 MG) BY MOUTH DAILY 05/11/20  Yes Simmons-Robinson, Makiera, MD  hydrocortisone (ANUSOL-HC) 2.5 % rectal cream Place 1 application rectally 2 (two) times daily. 06/26/19  Yes Abraham, Sherin, DO  lisinopril (ZESTRIL) 40 MG tablet TAKE 1 TABLET(40 MG) BY MOUTH DAILY 09/05/20  Yes Simmons-Robinson, Makiera, MD  metFORMIN (GLUCOPHAGE) 1000 MG tablet TAKE 1 TABLET BY MOUTH TWICE DAILY WITH A MEAL 05/27/19  Yes Ernest White, Sherin, DO  oxyCODONE (ROXICODONE) 5 MG immediate release tablet Take 1 tablet (5 mg total) by mouth every 6 (six) hours as needed for up to  15 doses for severe pain. 10/11/20  Yes Wyvonnia Dusky, MD  acetaminophen (TYLENOL) 500 MG tablet Take 1,000 mg by mouth every 6 (six) hours as needed for mild pain.    [provider]  acetaminophen-codeine (TYLENOL #3) 300-30 MG tablet Take 1 tablet by mouth every 6 (six) hours as needed for moderate pain. 02/11/18   Ernest Pelt, PA-C  azithromycin (ZITHROMAX) 250 MG tablet Take 542m on day 1 followed by 2570mon day 2-5 Patient not taking: Reported on 10/11/2020 06/09/19   Ernest White  Bayer Microlet Lancets lancets Use as instructed 10/20/18   Ernest White  Blood Glucose Monitoring Suppl (ACCU-CHEK GUIDE) w/Device KIT USE AS DIRECTED TO CHECK BLOOD SUGAR TWICE DAILY 01/08/19   Ernest White  budesonide-formoterol (SYMBICORT) 160-4.5 MCG/ACT inhaler Inhale 2 puffs into the lungs daily. Patient not taking: Reported on 10/11/2020 10/09/17   Ernest White  buPROPion ([facility]) 150 MG 12 hr tablet Take 150 mg by mouth daily. 08/20/19   [provider]  cyclobenzaprine (FLEXERIL) 10 MG tablet Take 1 tablet (10 mg total) by mouth 3 (three) times daily as needed for muscle spasms. Patient not taking: Reported on 10/11/2020 04/16/18   Ernest White  glipiZIDE (GLUCOTROL XL) 2.5 MG 24 hr tablet Take 2.5 mg by mouth daily. 08/20/19   [provider]  glucose blood (ACCU-CHEK AVIVA PLUS) test strip Use as directed to check blood glucose up to twice daily. E11.9.  May substitute for formulary preferred. 01/08/19   Ernest KlippelSherin, DO  glucose blood (ACCU-CHEK AVIVA PLUS) test strip USE AS DIRECTED TO TEST BLOOD SUGAR TWICE DAILY 07/25/20   Simmons-Robinson, MaRiki SheerMD  hydrocortisone (ANUSOL-HC) 25 MG suppository Place 1 suppository (25 mg total) rectally 2 (two) times daily. Patient not taking: Reported on 10/11/2020 06/17/19   Ernest White  metFORMIN (GLUCOPHAGE) 500 MG tablet Take 500 mg by mouth 3 (three) times daily. 08/20/19   [provider]  naltrexone (DEPADE) 50 MG tablet  08/20/19   [provider]  nicotine (NICODERM CQ - DOSED IN MG/24 HOURS) 14 mg/24hr patch Place 1 patch (14 mg total) onto the skin daily. Patient not taking: Reported on 10/11/2020 04/23/17   Ernest White  nicotine (NICODERM CQ - DOSED IN MG/24 HOURS) 21 mg/24hr patch Place 1 patch (21 mg total) onto the skin daily. Patient not taking: Reported on 10/11/2020 09/19/17   Ernest White  polyethylene glycol powder (GLYCOLAX/MIRALAX) 17 GM/SCOOP powder Take 17 g by mouth daily. 07/01/19   Ernest White  ranitidine (ZANTAC) 150 MG capsule Take 1 capsule (150 mg total) by mouth  2 (two) times daily. 10/20/18   Ernest White, Sherin, DO  sildenafil (REVATIO) 20 MG tablet TAKE 1 TABLET BY MOUTH AS NEEDED 30 MINUTES TO 1 HOUR BEFORE SEXUAL ACTIVITY. NO MORE THAN 5 IN ONE DAY TOTAL. 11/09/19   Simmons-Robinson, Riki Sheer, MD  tiotropium (SPIRIVA) 18 MCG inhalation capsule Place 1 capsule (18 mcg total) into inhaler and inhale daily. 07/26/16   Lovenia Kim, MD  traMADol (ULTRAM) 50 MG tablet Take 1 tablet (50 mg total) by mouth every 6 (six) hours as needed. 04/09/19   Ernest Pelt, PA-C  vitamin B-12 (CYANOCOBALAMIN) 1000 MCG tablet Take 1,000 mcg by mouth daily.    [provider]  XARELTO 20 MG TABS tablet TAKE 1 TABLET BY MOUTH DAILY WITH A MEAL 09/01/20   Simmons-Robinson, Makiera, MD    Allergies    Penicillins and Aspirin  Review of Systems   Review of Systems  Constitutional:  Positive for fatigue. Negative for chills and fever.  HENT:  Positive for sore throat and trouble swallowing. Negative for congestion and drooling.   Eyes:  Negative for pain and visual disturbance.  Respiratory:  Positive for shortness of breath. Negative for cough.   Cardiovascular:  Negative for chest pain and palpitations.  Gastrointestinal:  Negative for abdominal pain and vomiting.  Genitourinary:  Negative for dysuria and hematuria.   Musculoskeletal:  Negative for arthralgias and back pain.  Skin:  Negative for color change and rash.  Neurological:  Negative for syncope and headaches.  All other systems reviewed and are negative.  Physical Exam Updated Vital Signs BP 133/83   Pulse 91   Temp 97.9 F (36.6 C)   Resp 17   SpO2 96%   Physical Exam Constitutional:      General: He is not in acute distress.    Appearance: He is obese.  HENT:     Head: Normocephalic and atraumatic.  Eyes:     Conjunctiva/sclera: Conjunctivae normal.     Pupils: Pupils are equal, round, and reactive to light.  Neck:     Comments: Tenderness right submandibular region, + lymphadenopathy, exam limited by extent of adipose tissue Cardiovascular:     Rate and Rhythm: Normal rate and regular rhythm.  Pulmonary:     Effort: Pulmonary effort is normal. No respiratory distress.  Abdominal:     General: There is no distension.     Tenderness: There is no abdominal tenderness.  Skin:    General: Skin is warm and dry.  Neurological:     General: No focal deficit present.     Mental Status: He is alert. Mental status is at baseline.  Psychiatric:        Mood and Affect: Mood normal.        Behavior: Behavior normal.    ED Results / Procedures / Treatments   Labs (all labs ordered are listed, but only abnormal results are displayed) Labs Reviewed  BASIC METABOLIC PANEL - Abnormal; Notable for the following components:      Result Value   Glucose, Bld 253 (*)    All other components within normal limits  CBC WITH DIFFERENTIAL/PLATELET - Abnormal; Notable for the following components:   Hemoglobin 12.5 (*)    HCT 38.8 (*)    RDW 15.8 (*)    All other components within normal limits  GROUP A STREP BY PCR    EKG None  Radiology CT Soft Tissue Neck W Contrast  Result Date: 10/11/2020 CLINICAL DATA:  Neck abscess, deep tissue.  EXAM: CT NECK WITH CONTRAST TECHNIQUE: Multidetector CT imaging of the neck was performed using  the standard protocol following the bolus administration of intravenous contrast. CONTRAST:  33m OMNIPAQUE IOHEXOL 350 MG/ML SOLN COMPARISON:  None. FINDINGS: Pharynx and larynx: No evidence of mucosal or submucosal mass lesion. No tonsillitis. Some edema the parapharyngeal space in the supraglottic region presumably subsequent to the submandibular inflammation described below. Salivary glands: Parotid glands are normal. Left submandibular gland is normal. Right submandibular gland is enlarged and edematous. No visible stone in the gland or duct. Regional surrounding inflammatory change, including extending to the parapharyngeal space on the right as noted above. No evidence of drainable fluid density abscess. Thyroid: No thyroid mass. Lymph nodes: No lymphadenopathy or suppuration. Vascular: Normal vascular structures. Limited intracranial: Normal Visualized orbits: Not included. Mastoids and visualized paranasal sinuses: Clear Skeleton: Limited detail due to shoulder density. Ordinary spondylosis. Upper chest: Negative Other: None IMPRESSION: Enlarged and edematous submandibular gland on the right consisted with inflammatory change. No evidence stone in the gland or duct. Adjacent regional soft tissue edematous inflammatory change, including affecting the parapharyngeal space on the right. No evidence of a drainable fluid density abscess at this time. I do not see evidence of an underlying mass lesion, but repeat imaging could be considered if resolution does not occur as expected. Electronically Signed   By: MNelson ChimesM.D.   On: 10/11/2020 13:04    Procedures Procedures   Medications Ordered in ED Medications  iohexol (OMNIPAQUE) 350 MG/ML injection 75 mL (75 mLs Intravenous Contrast Given 10/11/20 1247)  clindamycin (CLEOCIN) capsule 450 mg (450 mg Oral Given 10/11/20 1420)  ketorolac (TORADOL) 30 MG/ML injection 30 mg (30 mg Intravenous Given 10/11/20 1420)  HYDROmorphone (DILAUDID) injection 1 mg (1 mg  Intravenous Given 10/11/20 1420)    ED Course  I have reviewed the triage vital signs and the nursing notes.  Pertinent labs & imaging results that were available during my care of the patient were reviewed by me and considered in my medical decision making (see chart for details).  Patient is seen here in the ED with multiple chronic complaints, primary complaining of sore throat, swelling around his neck.  Is not clear to me whether this lymphadenopathy, parotitis, or enlarged thyroid.  He has an extremely obese neck.  However there is some tenderness and swelling on the right lower side of the mandible.  I do not see any elevation of the tongue or brawny edema or muffled voice to suggest [place]'s angina.  His white blood cell count is normal.  However given limitations of exam, we will proceed to a CT scan soft tissue of the neck.  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  I suspect that he would benefit from a referral to ENT, consideration uvuloplasty, and I strongly suspect a large component of his generalized fatigue is related to severe obstructive sleep apnea.  He also describes to me what sounds like diabetic neuropathy, with pins and needle sensation in his hands and feet.  His blood sugar somewhat elevated here today.  I explained him that these need to be largely managed by his PCP, but is not happy with his current providers.  Reviewed the patient's CT scan.  It is unclear what is causing the inflammation around his gland.  Would not see any obvious stone.  His does not have a fever, leukocytosis, appear toxic to suggest bacterial infection.  However with this degree of inflammation I think is reasonable to start  him on clindamycin for 10 days.  I strongly encouraged him to follow-up with ENT, and explained that he needs evaluation for both this condition as well as likely obstructive sleep apnea.  He verbalized understanding.  I was able to provide him some pain medications in the ED as well as  short-term prescription at home.  He confirmed that he has not had ibuprofen and Motrin before, and he does not have any significant allergies to these.  Clinical Course as of 10/11/20 1446  Tue Oct 11, 2020  1329   IMPRESSION: Enlarged and edematous submandibular gland on the right consisted with inflammatory change. No evidence stone in the gland or duct. Adjacent regional soft tissue edematous inflammatory change, including affecting the parapharyngeal space on the right. No evidence of a drainable fluid density abscess at this time.   I do not see evidence of an underlying mass lesion, but repeat imaging could be considered if resolution does not occur as expected. [MT]    Clinical Course User Index [MT] Wyvonnia Dusky, MD    Final Clinical Impression(s) / ED Diagnoses Final diagnoses:  Submandibular gland inflammation    Rx / DC Orders ED Discharge Orders          Ordered    clindamycin (CLEOCIN) 150 MG capsule  3 times daily        10/11/20 1409    oxyCODONE (ROXICODONE) 5 MG immediate release tablet  Every 6 hours PRN        10/11/20 1409    acetaminophen (TYLENOL) 325 MG tablet  Every 6 hours PRN        10/11/20 1409             Wyvonnia Dusky, MD 10/11/20 1448

## 2020-10-31 ENCOUNTER — Other Ambulatory Visit: Payer: Self-pay | Admitting: Family Medicine

## 2020-11-22 ENCOUNTER — Other Ambulatory Visit: Payer: Self-pay | Admitting: Family Medicine

## 2020-11-22 DIAGNOSIS — E119 Type 2 diabetes mellitus without complications: Secondary | ICD-10-CM

## 2020-12-06 ENCOUNTER — Other Ambulatory Visit: Payer: Self-pay | Admitting: Family Medicine

## 2020-12-06 DIAGNOSIS — R918 Other nonspecific abnormal finding of lung field: Secondary | ICD-10-CM

## 2020-12-17 ENCOUNTER — Inpatient Hospital Stay: Admission: RE | Admit: 2020-12-17 | Payer: Medicare Other | Source: Ambulatory Visit

## 2021-01-06 ENCOUNTER — Inpatient Hospital Stay: Admission: RE | Admit: 2021-01-06 | Payer: Medicare Other | Source: Ambulatory Visit

## 2021-01-06 ENCOUNTER — Other Ambulatory Visit: Payer: Self-pay

## 2021-01-06 ENCOUNTER — Ambulatory Visit
Admission: RE | Admit: 2021-01-06 | Discharge: 2021-01-06 | Disposition: A | Discharge status: Home / Self Care | Payer: Medicare Other | Source: Ambulatory Visit | Attending: Family Medicine | Admitting: Family Medicine

## 2021-01-06 DIAGNOSIS — R918 Other nonspecific abnormal finding of lung field: Secondary | ICD-10-CM

## 2021-01-17 ENCOUNTER — Other Ambulatory Visit: Payer: Self-pay | Admitting: Family Medicine

## 2021-01-17 DIAGNOSIS — R918 Other nonspecific abnormal finding of lung field: Secondary | ICD-10-CM

## 2021-03-16 ENCOUNTER — Other Ambulatory Visit: Payer: Self-pay

## 2021-03-16 ENCOUNTER — Encounter: Payer: Medicare Other | Attending: Family Medicine | Admitting: Dietician

## 2021-03-16 ENCOUNTER — Encounter: Payer: Self-pay | Admitting: Dietician

## 2021-03-16 DIAGNOSIS — E119 Type 2 diabetes mellitus without complications: Secondary | ICD-10-CM

## 2021-03-16 NOTE — Patient Instructions (Addendum)
Look for sugar-free beverages like "Zero" sugar sodas, Crystal light powder, unsweetend tea, and plenty of water.  If having canned fruit, look for fruit that is canned in "Juice from Concentrate". Do not eat canned fruit in "Heavy Syrup".  Work towards eating three meals a day, about 5-6 hours apart!  Begin to recognize carbohydrates, proteins, and non-starchy vegetables in your food choices!  Begin to build your meals using the proportions of the Balanced Plate. First, select your carb choice(s) for the meal.Make this 25% of your meal. Next, select your source of protein to pair with your carb choice(s). Make this another 25% of your meal. Finally, complete your meal with a variety of non-starchy vegetables. Make this the remaining 50% of your meal.

## 2021-03-16 NOTE — Progress Notes (Signed)
Diabetes Self-Management Education  Visit Type: First/Initial  Appt. Start Time: 1700 Appt. End Time: 1800  03/16/2021  [name], identified by name and date of birth, is a 66 y.o. male with a diagnosis of Diabetes: Type 2.   ASSESSMENT Pt is taking glipizide, jardiance, and metformin for their diabetes. Pt is unaware the [name] is for their diabetes. Pt reports nausea every day that causes them to sweat, pt believes this is due to their metformin. Pt reports frequent diarrhea as well. Pt reports neuropathy, feels like pins and needles/burning sensations, stomach cramps. Pt reports blurry vision all the time, and dark spots in their vision. Pt reports wanting bariatric surgery to lose weight. Pt states that their main problem is eating, thinks there is no other solution for them to lose weight other than bariatric surgery.  Pt reports only eating breakfast and dinner, skips lunch. Breakfast around 10:00 am and dinner at 7:00 pm. Pt states they hate vegetables, will only eat fruit if it's canned. Pt reports cutting back on their soda consumption. Pt requested a sample menu for a week so that they know what to eat.  Height 5\' 11"  (1.803 m), weight 293 lb 9.6 oz (133.2 kg). Body mass index is 40.95 kg/m.   Diabetes Self-Management Education - 03/16/21 1716       Visit Information   Visit Type First/Initial      Initial Visit   Diabetes Type Type 2    Are you currently following a meal plan? No    Are you taking your medications as prescribed? No    Date Diagnosed 2016      Health Coping   How would you rate your overall health? Fair      Psychosocial Assessment   Patient Belief/Attitude about Diabetes Motivated to manage diabetes   Pt is in denial   Self-care barriers Impaired vision    Self-management support None    Other persons present Patient    Special Needs Large print    Preferred Learning Style No preference indicated    Learning Readiness Not Ready     How often do you need to have someone help you when you read instructions, pamphlets, or other written materials from your doctor or pharmacy? 1 - Never    What is the last grade level you completed in school? 11th grade      Pre-Education Assessment   Patient understands the diabetes disease and treatment process. Needs Instruction    Patient understands incorporating nutritional management into lifestyle. Needs Instruction    Patient undertands incorporating physical activity into lifestyle. Needs Instruction    Patient understands using medications safely. Needs Instruction    Patient understands monitoring blood glucose, interpreting and using results Needs Instruction    Patient understands prevention, detection, and treatment of acute complications. Needs Instruction    Patient understands prevention, detection, and treatment of chronic complications. Needs Instruction    Patient understands how to develop strategies to address psychosocial issues. Needs Instruction    Patient understands how to develop strategies to promote health/change behavior. Needs Instruction      Complications   Last HgB A1C per patient/outside source --   Referring provider did not provide results   How often do you check your blood sugar? 1-2 times/day    Fasting Blood glucose range (mg/dL) 130-179    Postprandial Blood glucose range (mg/dL) 130-179    Have you had a dilated eye exam in the past 12 months? No  Have you had a dental exam in the past 12 months? No    Are you checking your feet? Yes    How many days per week are you checking your feet? 4      Dietary Intake   Breakfast Egg white frittata w/ spinach, carrots, broccoli, 1 glass of orange juice    Snack (morning) none    Lunch none    Snack (afternoon) none    Dinner [employer], can of peaches    Snack (evening) none    Beverage(s) OJ, [employer], water      Exercise   Exercise Type ADL's    How many days per week to you  exercise? 0    How many minutes per day do you exercise? 0    Total minutes per week of exercise 0      Patient Education   Previous Diabetes Education No    Disease state  Explored patient's options for treatment of their diabetes    Nutrition management  Role of diet in the treatment of diabetes and the relationship between the three main macronutrients and blood glucose level;Carbohydrate counting;Food label reading, portion sizes and measuring food.    Medications Reviewed patients medication for diabetes, action, purpose, timing of dose and side effects.    Monitoring Purpose and frequency of SMBG.;Identified appropriate SMBG and/or A1C goals.    Acute complications Discussed and identified patients' treatment of hyperglycemia.    Chronic complications Relationship between chronic complications and blood glucose control;Assessed and discussed foot care and prevention of foot problems;Retinopathy and reason for yearly dilated eye exams    Psychosocial adjustment Helped patient identify a support system for diabetes management    Personal strategies to promote health Review risk of smoking and offered smoking cessation      Individualized Goals (developed by patient)   Nutrition Follow meal plan discussed    Physical Activity Not Applicable    Medications take my medication as prescribed    Monitoring  test my blood glucose as discussed;send in my blood glucose log as discussed      Post-Education Assessment   Patient understands the diabetes disease and treatment process. Needs Review    Patient understands incorporating nutritional management into lifestyle. Needs Review    Patient undertands incorporating physical activity into lifestyle. Needs Review    Patient understands using medications safely. Needs Review    Patient understands monitoring blood glucose, interpreting and using results Needs Review    Patient understands prevention, detection, and treatment of acute  complications. Needs Review    Patient understands prevention, detection, and treatment of chronic complications. Needs Review    Patient understands how to develop strategies to address psychosocial issues. Needs Review    Patient understands how to develop strategies to promote health/change behavior. Needs Review      Outcomes   Expected Outcomes Demonstrated limited interest in learning.  Expect minimal changes    Future DMSE 2 months    Program Status Not Completed             Individualized Plan for Diabetes Self-Management Training:   Learning Objective:  Patient will have a greater understanding of diabetes self-management. Patient education plan is to attend individual and/or group sessions per assessed needs and concerns.   Plan:   Patient Instructions  Look for sugar-free beverages like "Zero" sugar sodas, Crystal light powder, unsweetend tea, and plenty of water.  If having canned fruit, look for fruit that is canned  in "Juice from Concentrate". Do not eat canned fruit in "Heavy Syrup".  Work towards eating three meals a day, about 5-6 hours apart!  Begin to recognize carbohydrates, proteins, and non-starchy vegetables in your food choices!  Begin to build your meals using the proportions of the Balanced Plate. First, select your carb choice(s) for the meal.Make this 25% of your meal. Next, select your source of protein to pair with your carb choice(s). Make this another 25% of your meal. Finally, complete your meal with a variety of non-starchy vegetables. Make this the remaining 50% of your meal.   Expected Outcomes:  Demonstrated limited interest in learning.  Expect minimal changes  Education material provided: ADA - How to Thrive: A Guide for Your Journey with Diabetes and My Plate, Food List, 1,800 calorie 5-day sample menu  If problems or questions, patient to contact team via:  Phone and Email  Future DSME appointment: 2 months

## 2021-04-17 ENCOUNTER — Ambulatory Visit (INDEPENDENT_AMBULATORY_CARE_PROVIDER_SITE_OTHER): Payer: Medicare Other | Admitting: Pulmonary Disease

## 2021-04-17 ENCOUNTER — Other Ambulatory Visit: Payer: Self-pay

## 2021-04-17 ENCOUNTER — Encounter: Payer: Self-pay | Admitting: Pulmonary Disease

## 2021-04-17 ENCOUNTER — Emergency Department (HOSPITAL_COMMUNITY): Payer: Medicare Other

## 2021-04-17 ENCOUNTER — Encounter (HOSPITAL_COMMUNITY): Payer: Self-pay | Admitting: Emergency Medicine

## 2021-04-17 ENCOUNTER — Emergency Department (HOSPITAL_COMMUNITY)
Admission: EM | Admit: 2021-04-17 | Discharge: 2021-04-17 | Disposition: A | Discharge status: Home / Self Care | Payer: Medicare Other | Attending: Emergency Medicine | Admitting: Emergency Medicine

## 2021-04-17 VITALS — BP 138/76 | HR 104 | Temp 98.4°F | Ht 71.0 in | Wt 297.6 lb

## 2021-04-17 DIAGNOSIS — Y92410 Unspecified street and highway as the place of occurrence of the external cause: Secondary | ICD-10-CM | POA: Insufficient documentation

## 2021-04-17 DIAGNOSIS — Z7984 Long term (current) use of oral hypoglycemic drugs: Secondary | ICD-10-CM | POA: Insufficient documentation

## 2021-04-17 DIAGNOSIS — Z7951 Long term (current) use of inhaled steroids: Secondary | ICD-10-CM | POA: Diagnosis not present

## 2021-04-17 DIAGNOSIS — J449 Chronic obstructive pulmonary disease, unspecified: Secondary | ICD-10-CM | POA: Insufficient documentation

## 2021-04-17 DIAGNOSIS — Z87891 Personal history of nicotine dependence: Secondary | ICD-10-CM | POA: Diagnosis not present

## 2021-04-17 DIAGNOSIS — R911 Solitary pulmonary nodule: Secondary | ICD-10-CM

## 2021-04-17 DIAGNOSIS — M7918 Myalgia, other site: Secondary | ICD-10-CM

## 2021-04-17 DIAGNOSIS — M545 Low back pain, unspecified: Secondary | ICD-10-CM | POA: Diagnosis present

## 2021-04-17 DIAGNOSIS — M25511 Pain in right shoulder: Secondary | ICD-10-CM | POA: Insufficient documentation

## 2021-04-17 DIAGNOSIS — M542 Cervicalgia: Secondary | ICD-10-CM | POA: Insufficient documentation

## 2021-04-17 DIAGNOSIS — M47896 Other spondylosis, lumbar region: Secondary | ICD-10-CM | POA: Diagnosis not present

## 2021-04-17 DIAGNOSIS — M47819 Spondylosis without myelopathy or radiculopathy, site unspecified: Secondary | ICD-10-CM

## 2021-04-17 DIAGNOSIS — R0683 Snoring: Secondary | ICD-10-CM | POA: Diagnosis not present

## 2021-04-17 DIAGNOSIS — E119 Type 2 diabetes mellitus without complications: Secondary | ICD-10-CM | POA: Insufficient documentation

## 2021-04-17 DIAGNOSIS — Z7901 Long term (current) use of anticoagulants: Secondary | ICD-10-CM | POA: Diagnosis not present

## 2021-04-17 DIAGNOSIS — R519 Headache, unspecified: Secondary | ICD-10-CM | POA: Insufficient documentation

## 2021-04-17 MED ORDER — DICLOFENAC SODIUM 1 % EX GEL: 2.0000 g | Freq: Four times a day (QID) | CUTANEOUS | 0 refills | Status: DC

## 2021-04-17 MED ORDER — TRELEGY ELLIPTA 100-62.5-25 MCG/ACT IN AEPB: 1.0000 | INHALATION_SPRAY | Freq: Every day | RESPIRATORY_TRACT | 5 refills | Status: DC

## 2021-04-17 MED ORDER — LIDOCAINE 5 % EX PTCH: 1.0000 | MEDICATED_PATCH | CUTANEOUS | 0 refills | Status: AC

## 2021-04-17 NOTE — ED Notes (Signed)
Transported via wheelchair for x-ray.

## 2021-04-17 NOTE — ED Triage Notes (Signed)
[place], complains of neck pain, right shoulder pain, right leg pain and back pain. Ambulatory. Vitals stable per EMS.

## 2021-04-17 NOTE — Patient Instructions (Signed)
Will schedule CT chest, pulmonary function, and home sleep study.  Will call you as test results come in.  Stop using spiriva and symbicort.  Trelegy one puff daily, and rinse your mouth after each use.  Albuterol two puffs every 6 hours as needed for cough, wheeze, or chest congestion.  Follow up in 2 months.

## 2021-04-17 NOTE — Discharge Instructions (Addendum)
Your CT scans and x-rays are reassuring today.  You do have bad arthritis in your neck and back which may be worsened by your injury today.  Apply Lidoderm patch and Voltaren gel as prescribed. Apply warm compresses to sore muscles for 10 minutes at a time. Follow-up with your primary care provider for recheck.

## 2021-04-17 NOTE — Progress Notes (Signed)
[facility]  Chief Complaint  Patient presents with   Consult    Consult for COPD. Pt states that he has had COPD for several years. Coughing, SOB, wheezing are some of the symptoms that pt does experience.     Past Surgical History:  He  has a past surgical history that includes Appendectomy and Hernia repair.  Past Medical History:  OA, DM, Gout, HTN  Constitutional:  BP 138/76 (BP Location: Left Arm, Patient Position: Sitting, Cuff Size: Normal)    Pulse (!) 104    Temp 98.4 F (36.9 C) (Oral)    Ht 5' 11"  (1.803 m)    Wt 297 lb 9.6 oz (135 kg)    SpO2 99%    BMI 41.51 kg/m   Brief Summary:  [facility] is a 66 y.o. male former smoker with COPD, snoring, and lung nodules.      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  He had breathing test at work and was told he had COPD.  He qualified for disability because of this.  He started smoking at age 31, and smoked 1.5 ppd.  He quit about 6 months ago.  Several of his family members had COPD, asthma, and lung cancer.  He had pneumonia years ago.  No exposure to TB.  Never in the [employer]  He has trouble with allergies in the Spring and Fall.  He "died" twice when he took penicillin and had to be resuscitated.  He is not aware of food allergies.  Aspirin makes him feel sick.  He has been using spiriva and symbicort.  Still gets cough, wheeze, and chest congestion.  He snores and stops breathing at night.  He is restless at night.  He feels like his energy gets zapped and he has to nap during the day.  Physical Exam:   Appearance - well kempt   ENMT - no sinus tenderness, no oral exudate, no LAN, Mallampati 4 airway, no stridor  Respiratory - equal breath sounds bilaterally, no wheezing or rales  CV - s1s2 regular rate and rhythm, no murmurs  Ext - no clubbing, no edema  Skin - no rashes  Psych - normal mood and affect   Pulmonary testing:    Chest  Imaging:  CT chest 01/09/21 >> few scattered nodules up to 6 mm, bronchial wall thickening, atelectasis  Sleep Tests:  PSG 05/09/17 >> AHI 5, SpO2 low 87%  Cardiac Tests:    Social History:  He  reports that he quit smoking about 5 months ago. His smoking use included cigarettes. He has a 48.00 pack-year smoking history. He has never used smokeless tobacco. He reports current alcohol use of about 1.0 standard drink per week. He reports that he does not use drugs.  Family History:  His family history includes Cancer in his sister and sister; Cancer - Lung in his father; Deep vein thrombosis in his brother; Heart disease in his sister; Other in his sister; Pulmonary embolism in his brother.     Assessment/Plan:   COPD with chronic bronchitis. - will have him change from spiriva and symbicort to trelegy 100 one puff daily - prn albuterol - will arrange for pulmonary function test  Snoring. - associated with apnea, disrupted sleep and daytime sleepiness - he has a history of hypertension and his BMI is > 35 - I am concerned he could have obstructive sleep apnea - will arrange for  home sleep study to further assess  Lung nodules. - will need f/u CT chest without contrast to further assess and then determine if he needs lung tissue sampling  Time Spent Involved in Patient Care on Day of Examination:  47 minutes  Follow up:   Patient Instructions  Will schedule CT chest, pulmonary function, and home sleep study.  Will call you as test results come in.  Stop using spiriva and symbicort.  Trelegy one puff daily, and rinse your mouth after each use.  Albuterol two puffs every 6 hours as needed for cough, wheeze, or chest congestion.  Follow up in 2 months.  Medication List:   Allergies as of 04/17/2021       Reactions   Penicillins Anaphylaxis   Has patient had a PCN reaction causing immediate rash, facial/tongue/throat swelling, SOB or lightheadedness with hypotension:  Yes Has patient had a PCN reaction causing severe rash involving mucus membranes or skin necrosis: No Has patient had a PCN reaction that required hospitalization pt was hospitalized at time of reaction Has patient had a PCN reaction occurring within the last 10 years: No If all of the above answers are "NO", then may proceed with Cephalosporin use.   Aspirin Other (See Comments)   Stomach pain        Medication List        Accurate as of April 17, 2021  2:57 PM. If you have any questions, ask your nurse or doctor.          STOP taking these medications    acetaminophen-codeine 300-30 MG tablet Commonly known as: TYLENOL #3 Stopped by: Chesley Mires, MD   azithromycin 250 MG tablet Commonly known as: ZITHROMAX Stopped by: Chesley Mires, MD   budesonide-formoterol 160-4.5 MCG/ACT inhaler Commonly known as: SYMBICORT Stopped by: Chesley Mires, MD   nicotine 14 mg/24hr patch Commonly known as: NICODERM CQ - dosed in mg/24 hours Stopped by: Chesley Mires, MD   nicotine 21 mg/24hr patch Commonly known as: NICODERM CQ - dosed in mg/24 hours Stopped by: Chesley Mires, MD   tiotropium 18 MCG inhalation capsule Commonly known as: SPIRIVA Stopped by: Chesley Mires, MD       TAKE these medications    Accu-Chek Aviva Plus test strip Generic drug: glucose blood Use as directed to check blood glucose up to twice daily. E11.9.  May substitute for formulary preferred.   Accu-Chek Aviva Plus test strip Generic drug: glucose blood TEST BLOOD SUGAR TWICE DAILY   Accu-Chek Guide w/Device Kit USE AS DIRECTED TO CHECK BLOOD SUGAR TWICE DAILY   acetaminophen 500 MG tablet Commonly known as: TYLENOL Take 1,000 mg by mouth every 6 (six) hours as needed for mild pain.   acetaminophen 325 MG tablet Commonly known as: Tylenol Take 2 tablets (650 mg total) by mouth every 6 (six) hours as needed for up to 30 doses for moderate pain.   albuterol 108 (90 Base) MCG/ACT inhaler Commonly  known as: VENTOLIN HFA INHALE 2 PUFFS INTO THE LUNGS EVERY 6 HOURS AS NEEDED FOR WHEEZING OR SHORTNESS OF BREATH   Bayer Microlet Lancets lancets Use as instructed   buPROPion 150 MG 12 hr tablet Commonly known as: WELLBUTRIN SR Take 150 mg by mouth daily.   cyclobenzaprine 10 MG tablet Commonly known as: FLEXERIL Take 1 tablet (10 mg total) by mouth 3 (three) times daily as needed for muscle spasms.   empagliflozin 25 MG Tabs tablet Commonly known as: JARDIANCE Take 1 tablet (25 mg total)  by mouth daily.   glipiZIDE 2.5 MG 24 hr tablet Commonly known as: GLUCOTROL XL Take 2.5 mg by mouth daily.   hydrochlorothiazide 25 MG tablet Commonly known as: HYDRODIURIL TAKE 1 TABLET(25 MG) BY MOUTH DAILY   hydrocortisone 2.5 % rectal cream Commonly known as: Anusol-HC Place 1 application rectally 2 (two) times daily.   hydrocortisone 25 MG suppository Commonly known as: ANUSOL-HC Place 1 suppository (25 mg total) rectally 2 (two) times daily.   lisinopril 40 MG tablet Commonly known as: ZESTRIL TAKE 1 TABLET(40 MG) BY MOUTH DAILY   metFORMIN 1000 MG tablet Commonly known as: GLUCOPHAGE TAKE 1 TABLET BY MOUTH TWICE DAILY WITH A MEAL   metFORMIN 500 MG tablet Commonly known as: GLUCOPHAGE Take 500 mg by mouth 3 (three) times daily.   naltrexone 50 MG tablet Commonly known as: DEPADE   oxyCODONE 5 MG immediate release tablet Commonly known as: Roxicodone Take 1 tablet (5 mg total) by mouth every 6 (six) hours as needed for up to 15 doses for severe pain.   polyethylene glycol powder 17 GM/SCOOP powder Commonly known as: GLYCOLAX/MIRALAX Take 17 g by mouth daily.   ranitidine 150 MG capsule Commonly known as: ZANTAC Take 1 capsule (150 mg total) by mouth 2 (two) times daily.   sildenafil 20 MG tablet Commonly known as: REVATIO TAKE 1 TABLET BY MOUTH AS NEEDED 30 MINUTES TO 1 HOUR BEFORE SEXUAL ACTIVITY. NO MORE THAN 5 IN ONE DAY TOTAL.   traMADol 50 MG  tablet Commonly known as: ULTRAM Take 1 tablet (50 mg total) by mouth every 6 (six) hours as needed.   Trelegy Ellipta 100-62.5-25 MCG/ACT Aepb Generic drug: Fluticasone-Umeclidin-Vilant Inhale 1 puff into the lungs daily. Started by: Chesley Mires, MD   vitamin B-12 1000 MCG tablet Commonly known as: CYANOCOBALAMIN Take 1,000 mcg by mouth daily.   Xarelto 20 MG Tabs tablet Generic drug: rivaroxaban TAKE 1 TABLET BY MOUTH DAILY WITH A MEAL        Signature:  Chesley Mires, MD [place] Pager - [phone] 04/17/2021, 2:57 PM
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
## 2021-04-18 ENCOUNTER — Telehealth (HOSPITAL_COMMUNITY): Payer: Self-pay | Admitting: Emergency Medicine

## 2021-04-18 ENCOUNTER — Telehealth: Payer: Self-pay | Admitting: Pulmonary Disease

## 2021-04-18 DIAGNOSIS — J449 Chronic obstructive pulmonary disease, unspecified: Secondary | ICD-10-CM

## 2021-04-18 MED ORDER — ALBUTEROL SULFATE HFA 108 (90 BASE) MCG/ACT IN AERS
INHALATION_SPRAY | RESPIRATORY_TRACT | 2 refills | Status: AC
Start: 2021-04-18 — End: ?

## 2021-04-18 MED ORDER — DICLOFENAC SODIUM 1 % EX GEL: 2.0000 g | Freq: Four times a day (QID) | CUTANEOUS | 0 refills | Status: DC

## 2021-04-18 MED ORDER — LIDOCAINE 2 % EX GEL: 2.0000 "application " | Freq: Two times a day (BID) | CUTANEOUS | 0 refills | Status: DC

## 2021-04-18 NOTE — Telephone Encounter (Signed)
Patient is aware that albuterol and trelegy has been sent to preferred pharmacy.  He voiced his understanding and had no further questions.  Nothing further needed.

## 2021-04-18 NOTE — Telephone Encounter (Signed)
Asked to send meds to different pharmacy.  Reviewed meds-topical lido and diclfenac-allergy to diclofenac unlikely to react with topical. Sent to [facility] per patient request

## 2021-04-25 ENCOUNTER — Ambulatory Visit (HOSPITAL_COMMUNITY)
Admission: RE | Admit: 2021-04-25 | Discharge: 2021-04-25 | Disposition: A | Discharge status: Home / Self Care | Payer: Medicare Other | Source: Ambulatory Visit | Attending: Pulmonary Disease | Admitting: Pulmonary Disease

## 2021-04-25 ENCOUNTER — Other Ambulatory Visit: Payer: Self-pay

## 2021-04-25 DIAGNOSIS — Z87891 Personal history of nicotine dependence: Secondary | ICD-10-CM | POA: Insufficient documentation

## 2021-04-25 DIAGNOSIS — R911 Solitary pulmonary nodule: Secondary | ICD-10-CM | POA: Insufficient documentation

## 2021-05-29 ENCOUNTER — Ambulatory Visit: Payer: Medicare Other | Admitting: Dietician

## 2021-05-30 ENCOUNTER — Encounter: Payer: Medicare Other | Attending: Family Medicine | Admitting: Dietician

## 2021-05-30 DIAGNOSIS — E119 Type 2 diabetes mellitus without complications: Secondary | ICD-10-CM | POA: Insufficient documentation

## 2021-06-20 ENCOUNTER — Encounter: Payer: Self-pay | Admitting: Pulmonary Disease

## 2021-06-27 ENCOUNTER — Ambulatory Visit: Payer: Medicare Other | Admitting: Pulmonary Disease

## 2021-08-15 ENCOUNTER — Encounter: Payer: Self-pay | Admitting: *Deleted

## 2021-08-27 ENCOUNTER — Other Ambulatory Visit: Payer: Self-pay | Admitting: Family Medicine

## 2021-10-20 ENCOUNTER — Other Ambulatory Visit (HOSPITAL_COMMUNITY): Payer: Self-pay

## 2021-10-20 ENCOUNTER — Inpatient Hospital Stay (HOSPITAL_COMMUNITY)
Admission: EM | Admit: 2021-10-20 | Discharge: 2021-10-27 | DRG: 682 | Disposition: A | Discharge status: Home Health | Payer: Medicare Other | Attending: Family Medicine | Admitting: Family Medicine

## 2021-10-20 DIAGNOSIS — S0990XA Unspecified injury of head, initial encounter: Secondary | ICD-10-CM

## 2021-10-20 DIAGNOSIS — Y905 Blood alcohol level of 100-119 mg/100 ml: Secondary | ICD-10-CM | POA: Diagnosis present

## 2021-10-20 DIAGNOSIS — Z7984 Long term (current) use of oral hypoglycemic drugs: Secondary | ICD-10-CM

## 2021-10-20 DIAGNOSIS — Z8249 Family history of ischemic heart disease and other diseases of the circulatory system: Secondary | ICD-10-CM

## 2021-10-20 DIAGNOSIS — T1490XA Injury, unspecified, initial encounter: Secondary | ICD-10-CM

## 2021-10-20 DIAGNOSIS — Z781 Physical restraint status: Secondary | ICD-10-CM

## 2021-10-20 DIAGNOSIS — N179 Acute kidney failure, unspecified: Secondary | ICD-10-CM | POA: Diagnosis not present

## 2021-10-20 DIAGNOSIS — M546 Pain in thoracic spine: Secondary | ICD-10-CM

## 2021-10-20 DIAGNOSIS — S92514A Nondisplaced fracture of proximal phalanx of right lesser toe(s), initial encounter for closed fracture: Secondary | ICD-10-CM | POA: Diagnosis present

## 2021-10-20 DIAGNOSIS — Z6841 Body Mass Index (BMI) 40.0 and over, adult: Secondary | ICD-10-CM

## 2021-10-20 DIAGNOSIS — R296 Repeated falls: Secondary | ICD-10-CM | POA: Diagnosis present

## 2021-10-20 DIAGNOSIS — F10929 Alcohol use, unspecified with intoxication, unspecified: Secondary | ICD-10-CM

## 2021-10-20 DIAGNOSIS — E872 Acidosis, unspecified: Secondary | ICD-10-CM | POA: Diagnosis present

## 2021-10-20 DIAGNOSIS — Z20822 Contact with and (suspected) exposure to covid-19: Secondary | ICD-10-CM | POA: Diagnosis present

## 2021-10-20 DIAGNOSIS — E860 Dehydration: Secondary | ICD-10-CM | POA: Diagnosis present

## 2021-10-20 DIAGNOSIS — J449 Chronic obstructive pulmonary disease, unspecified: Secondary | ICD-10-CM | POA: Diagnosis present

## 2021-10-20 DIAGNOSIS — R410 Disorientation, unspecified: Secondary | ICD-10-CM

## 2021-10-20 DIAGNOSIS — S060X0A Concussion without loss of consciousness, initial encounter: Secondary | ICD-10-CM | POA: Diagnosis present

## 2021-10-20 DIAGNOSIS — F10139 Alcohol abuse with withdrawal, unspecified: Secondary | ICD-10-CM | POA: Diagnosis present

## 2021-10-20 DIAGNOSIS — R7989 Other specified abnormal findings of blood chemistry: Secondary | ICD-10-CM

## 2021-10-20 DIAGNOSIS — E876 Hypokalemia: Secondary | ICD-10-CM | POA: Diagnosis present

## 2021-10-20 DIAGNOSIS — S0031XA Abrasion of nose, initial encounter: Secondary | ICD-10-CM | POA: Diagnosis present

## 2021-10-20 DIAGNOSIS — W1789XA Other fall from one level to another, initial encounter: Secondary | ICD-10-CM | POA: Diagnosis present

## 2021-10-20 DIAGNOSIS — Z23 Encounter for immunization: Secondary | ICD-10-CM

## 2021-10-20 DIAGNOSIS — Z86711 Personal history of pulmonary embolism: Secondary | ICD-10-CM

## 2021-10-20 DIAGNOSIS — F10129 Alcohol abuse with intoxication, unspecified: Secondary | ICD-10-CM | POA: Diagnosis present

## 2021-10-20 DIAGNOSIS — S92516A Nondisplaced fracture of proximal phalanx of unspecified lesser toe(s), initial encounter for closed fracture: Secondary | ICD-10-CM | POA: Insufficient documentation

## 2021-10-20 DIAGNOSIS — M545 Low back pain, unspecified: Secondary | ICD-10-CM

## 2021-10-20 DIAGNOSIS — I10 Essential (primary) hypertension: Secondary | ICD-10-CM | POA: Diagnosis present

## 2021-10-20 DIAGNOSIS — Z79899 Other long term (current) drug therapy: Secondary | ICD-10-CM

## 2021-10-20 DIAGNOSIS — Z720 Tobacco use: Secondary | ICD-10-CM

## 2021-10-20 DIAGNOSIS — E119 Type 2 diabetes mellitus without complications: Secondary | ICD-10-CM | POA: Diagnosis present

## 2021-10-20 DIAGNOSIS — G928 Other toxic encephalopathy: Secondary | ICD-10-CM | POA: Diagnosis present

## 2021-10-20 DIAGNOSIS — S0083XA Contusion of other part of head, initial encounter: Secondary | ICD-10-CM | POA: Diagnosis present

## 2021-10-20 DIAGNOSIS — F17210 Nicotine dependence, cigarettes, uncomplicated: Secondary | ICD-10-CM | POA: Diagnosis present

## 2021-10-20 DIAGNOSIS — M6282 Rhabdomyolysis: Secondary | ICD-10-CM | POA: Diagnosis present

## 2021-10-20 DIAGNOSIS — Z7901 Long term (current) use of anticoagulants: Secondary | ICD-10-CM

## 2021-10-20 DIAGNOSIS — F10939 Alcohol use, unspecified with withdrawal, unspecified: Secondary | ICD-10-CM | POA: Insufficient documentation

## 2021-10-20 MED ORDER — FENTANYL CITRATE PF 50 MCG/ML IJ SOSY
50.0000 ug | PREFILLED_SYRINGE | Freq: Once | INTRAMUSCULAR | Status: DC
  Filled 2021-10-20: qty 1

## 2021-10-20 MED ORDER — SODIUM CHLORIDE 0.9 % IV BOLUS: 125.0000 mL | Freq: Once | INTRAVENOUS | Status: DC

## 2021-10-20 MED ORDER — SODIUM CHLORIDE 0.9 % IV BOLUS
500.0000 mL | Freq: Once | INTRAVENOUS | Status: AC
  Administered 2021-10-21: 500 mL via INTRAVENOUS

## 2021-10-20 MED ORDER — FENTANYL CITRATE PF 50 MCG/ML IJ SOSY
50.0000 ug | PREFILLED_SYRINGE | Freq: Once | INTRAMUSCULAR | Status: AC
  Administered 2021-10-20: 50 ug via INTRAVENOUS

## 2021-10-20 MED ORDER — FENTANYL CITRATE PF 50 MCG/ML IJ SOSY
PREFILLED_SYRINGE | INTRAMUSCULAR | Status: AC
  Filled 2021-10-20: qty 1

## 2021-10-20 MED ORDER — TETANUS-DIPHTH-ACELL PERTUSSIS 5-2.5-18.5 LF-MCG/0.5 IM SUSY
0.5000 mL | PREFILLED_SYRINGE | Freq: Once | INTRAMUSCULAR | Status: AC
  Administered 2021-10-21: 0.5 mL via INTRAMUSCULAR
  Filled 2021-10-20: qty 0.5

## 2021-10-20 NOTE — ED Provider Notes (Signed)
[place] EMERGENCY DEPARTMENT Provider Note   CSN: [id number] Arrival date & time: 10/20/21  2341     History {Add pertinent medical, surgical, social history, OB history to HPI:1} No chief complaint on file.   Ernest White is a 66 y.o. male.  Patient presents with EMS after approximately 7 foot fall and head injury.  Per report patient has been drinking alcohol unknown amount.  Details of events questionable due to patient's intoxication.  Patient difficult on route due to refusing to wear c-collar, yelling in pain of neck and back pain and head pain.  Patient does not know the city he is in.  Patient says he has diabetes will not elaborate on other medical history or blood thinner use.       Home Medications Prior to Admission medications   Medication Sig Start Date End Date Taking? Authorizing Provider  acetaminophen (TYLENOL) 325 MG tablet Take 2 tablets (650 mg total) by mouth every 6 (six) hours as needed for up to 30 doses for moderate pain. 10/11/20   Wyvonnia Dusky, MD  acetaminophen (TYLENOL) 500 MG tablet Take 1,000 mg by mouth every 6 (six) hours as needed for mild pain.    [provider]  albuterol (VENTOLIN HFA) 108 (90 Base) MCG/ACT inhaler INHALE 2 PUFFS INTO THE LUNGS EVERY 6 HOURS AS NEEDED FOR WHEEZING OR SHORTNESS OF BREATH 04/18/21   Chesley Mires, MD  Bayer Microlet Lancets lancets Use as instructed 10/20/18   Caroline More, DO  Blood Glucose Monitoring Suppl (ACCU-CHEK GUIDE) w/Device KIT USE AS DIRECTED TO CHECK BLOOD SUGAR TWICE DAILY 01/08/19   Caroline More, DO  buPROPion [facility] SR) 150 MG 12 hr tablet Take 150 mg by mouth daily. 08/20/19   [provider]  cyclobenzaprine (FLEXERIL) 10 MG tablet Take 1 tablet (10 mg total) by mouth 3 (three) times daily as needed for muscle spasms. 04/16/18   Harriet Butte, DO  diclofenac Sodium (VOLTAREN) 1 % GEL Apply 2 g topically 4 (four) times daily. 04/17/21   Tacy Learn, PA-C  diclofenac Sodium (VOLTAREN) 1 % GEL Apply 2 g topically 4 (four) times daily. 04/18/21   Pattricia Boss, MD  Fluticasone-Umeclidin-Vilant (TRELEGY ELLIPTA) 100-62.5-25 MCG/ACT AEPB Inhale 1 puff into the lungs daily. 04/17/21   Chesley Mires, MD  glipiZIDE (GLUCOTROL XL) 2.5 MG 24 hr tablet Take 2.5 mg by mouth daily. 08/20/19   [provider]  glucose blood (ACCU-CHEK AVIVA PLUS) test strip Use as directed to check blood glucose up to twice daily. E11.9.  May substitute for formulary preferred. 01/08/19   Tammi Klippel, Sherin, DO  glucose blood (ACCU-CHEK AVIVA PLUS) test strip TEST BLOOD SUGAR TWICE DAILY 11/22/20   Simmons-Robinson, Riki Sheer, MD  hydrochlorothiazide (HYDRODIURIL) 25 MG tablet TAKE 1 TABLET(25 MG) BY MOUTH DAILY 05/11/20   Simmons-Robinson, Makiera, MD  hydrocortisone (ANUSOL-HC) 2.5 % rectal cream Place 1 application rectally 2 (two) times daily. 06/26/19   Caroline More, DO  hydrocortisone (ANUSOL-HC) 25 MG suppository Place 1 suppository (25 mg total) rectally 2 (two) times daily. 06/17/19   Tammi Klippel, Sherin, DO  JARDIANCE 25 MG TABS tablet TAKE 1 TABLET(25 MG) BY MOUTH DAILY 08/29/21   Simmons-Robinson, Makiera, MD  lidocaine (LIDODERM) 5 % Place 1 patch onto the skin daily. Remove & Discard patch within 12 hours or as directed by MD 04/17/21   Tacy Learn, PA-C  Lidocaine 2 % GEL Apply 2 application topically 2 (two) times daily. 04/18/21   Ray,  Andee Poles, MD  lisinopril (ZESTRIL) 40 MG tablet TAKE 1 TABLET(40 MG) BY MOUTH DAILY 09/05/20   Simmons-Robinson, Riki Sheer, MD  metFORMIN (GLUCOPHAGE) 1000 MG tablet TAKE 1 TABLET BY MOUTH TWICE DAILY WITH A MEAL 05/27/19   Caroline More, DO  metFORMIN (GLUCOPHAGE) 500 MG tablet Take 500 mg by mouth 3 (three) times daily. 08/20/19   [provider]  naltrexone (DEPADE) 50 MG tablet  08/20/19   [provider]  oxyCODONE (ROXICODONE) 5 MG immediate release tablet Take 1 tablet (5 mg total) by mouth every 6 (six)  hours as needed for up to 15 doses for severe pain. 10/11/20   Wyvonnia Dusky, MD  polyethylene glycol powder (GLYCOLAX/MIRALAX) 17 GM/SCOOP powder Take 17 g by mouth daily. 07/01/19   Caroline More, DO  ranitidine (ZANTAC) 150 MG capsule Take 1 capsule (150 mg total) by mouth 2 (two) times daily. 10/20/18   Tammi Klippel, Sherin, DO  sildenafil (REVATIO) 20 MG tablet TAKE 1 TABLET BY MOUTH AS NEEDED 30 MINUTES TO 1 HOUR BEFORE SEXUAL ACTIVITY. NO MORE THAN 5 IN ONE DAY TOTAL. 11/09/19   Simmons-Robinson, Makiera, MD  traMADol (ULTRAM) 50 MG tablet Take 1 tablet (50 mg total) by mouth every 6 (six) hours as needed. 04/09/19   Pete Pelt, PA-C  vitamin B-12 (CYANOCOBALAMIN) 1000 MCG tablet Take 1,000 mcg by mouth daily.    [provider]  XARELTO 20 MG TABS tablet TAKE 1 TABLET BY MOUTH DAILY WITH A MEAL 09/01/20   Simmons-Robinson, Makiera, MD      Allergies    Penicillins and Aspirin    Review of Systems   Review of Systems  Unable to perform ROS: Mental status change    Physical Exam Updated Vital Signs There were no vitals taken for this visit. Physical Exam Vitals and nursing note reviewed.  Constitutional:      General: He is not in acute distress.    Appearance: He is well-developed.  HENT:     Head: Normocephalic.     Comments: Patient has approximate 10 cm hematoma and superficial abrasion with minimal bleeding left parietal scalp.  Neck supple patient refuses to wear c-collar.  Patient has midline and paraspinal thoracic lumbar and cervical tenderness without obvious step-off.    Mouth/Throat:     Mouth: Mucous membranes are dry.  Eyes:     General:        Right eye: No discharge.        Left eye: No discharge.     Conjunctiva/sclera: Conjunctivae normal.  Neck:     Trachea: No tracheal deviation.  Cardiovascular:     Rate and Rhythm: Regular rhythm. Tachycardia present.     Heart sounds: No murmur heard. Pulmonary:     Effort: Pulmonary effort is normal.      Breath sounds: Normal breath sounds.  Abdominal:     General: There is no distension.     Palpations: Abdomen is soft.     Tenderness: There is abdominal tenderness (diffuse mild). There is no guarding.  Musculoskeletal:        General: Swelling and tenderness present.     Cervical back: Neck supple. No rigidity.     Comments: Patient has no focal tenderness in all extremities except for bilateral hips with flexion.  Skin:    General: Skin is warm.     Capillary Refill: Capillary refill takes less than 2 seconds.     Findings: Erythema present.  Neurological:     General:  No focal deficit present.     Mental Status: He is alert.     GCS: GCS eye subscore is 4. GCS verbal subscore is 4. GCS motor subscore is 6.     Cranial Nerves: No cranial nerve deficit.     Comments: Patient move all extremities with equal 5+ strength bilateral upper and lower, gross sensation intact to pain bilateral.  Difficulty following detailed neuro exam horizontal eye movements intact, pupils equal.  Clinically intoxicated on discussion.  Patient does not know city or location.  Psychiatric:     Comments: Clinically intoxicated     ED Results / Procedures / Treatments   Labs (all labs ordered are listed, but only abnormal results are displayed) Labs Reviewed  RESP PANEL BY RT-PCR (FLU A&B, COVID) ARPGX2  COMPREHENSIVE METABOLIC PANEL  CBC  ETHANOL  URINALYSIS, ROUTINE W REFLEX MICROSCOPIC  LACTIC ACID, PLASMA  PROTIME-INR  I-STAT CHEM 8, ED  SAMPLE TO BLOOD BANK    EKG None  Radiology No results found.  Procedures Procedures  {Document cardiac monitor, telemetry assessment procedure when appropriate:1}  Medications Ordered in ED Medications  sodium chloride 0.9 % bolus 125 mL (has no administration in time range)  fentaNYL (SUBLIMAZE) injection 50 mcg (has no administration in time range)  Tdap (BOOSTRIX) injection 0.5 mL (has no administration in time range)  fentaNYL (SUBLIMAZE)  injection 50 mcg (has no administration in time range)    ED Course/ Medical Decision Making/ A&P                           Medical Decision Making Amount and/or Complexity of Data Reviewed Labs: ordered. Radiology: ordered. ECG/medicine tests: ordered.  Risk Prescription drug management.   Patient presents with EMS after moderate mechanism of injury, clinical intoxication altered mental status and possibly being on anticoagulation.  With these risk factors patient upgraded to level 2 trauma discussed with nursing.  Discussed starting IV fluids, fentanyl for pain and if needed will give Haldol for agitation to allow [place] to continue his work-up especially with high risk injury.  Medical records reviewed and patient has been on Xarelto in 2023 unsure if taking recently.  We will assume he has been taking based on trauma.  Portable chest x-rays ordered.  EKG ordered and reviewed normal QT, sinus tachycardia heart rate 100, nonspecific ST findings.  {Document critical care time when appropriate:1} {Document review of labs and clinical decision tools ie heart score, Chads2Vasc2 etc:1}  {Document your independent review of radiology images, and any outside records:1} {Document your discussion with family members, caretakers, and with consultants:1} {Document social determinants of health affecting pt's care:1} {Document your decision making why or why not admission, treatments were needed:1} Final Clinical Impression(s) / ED Diagnoses Final diagnoses:  Acute head injury, initial encounter  Acute bilateral thoracic back pain  Alcoholic intoxication with complication ([place])    Rx / DC Orders ED Discharge Orders     None

## 2021-10-21 ENCOUNTER — Emergency Department (HOSPITAL_COMMUNITY): Payer: Medicare Other

## 2021-10-21 ENCOUNTER — Observation Stay (HOSPITAL_COMMUNITY): Payer: Medicare Other

## 2021-10-21 DIAGNOSIS — Z781 Physical restraint status: Secondary | ICD-10-CM | POA: Diagnosis not present

## 2021-10-21 DIAGNOSIS — Z79899 Other long term (current) drug therapy: Secondary | ICD-10-CM | POA: Diagnosis not present

## 2021-10-21 DIAGNOSIS — T1490XA Injury, unspecified, initial encounter: Secondary | ICD-10-CM

## 2021-10-21 DIAGNOSIS — E0869 Diabetes mellitus due to underlying condition with other specified complication: Secondary | ICD-10-CM | POA: Diagnosis not present

## 2021-10-21 DIAGNOSIS — F10139 Alcohol abuse with withdrawal, unspecified: Secondary | ICD-10-CM | POA: Diagnosis present

## 2021-10-21 DIAGNOSIS — S0083XA Contusion of other part of head, initial encounter: Secondary | ICD-10-CM | POA: Diagnosis present

## 2021-10-21 DIAGNOSIS — S0990XD Unspecified injury of head, subsequent encounter: Secondary | ICD-10-CM | POA: Diagnosis not present

## 2021-10-21 DIAGNOSIS — S0990XA Unspecified injury of head, initial encounter: Secondary | ICD-10-CM

## 2021-10-21 DIAGNOSIS — F17210 Nicotine dependence, cigarettes, uncomplicated: Secondary | ICD-10-CM | POA: Diagnosis present

## 2021-10-21 DIAGNOSIS — S060X0A Concussion without loss of consciousness, initial encounter: Secondary | ICD-10-CM | POA: Diagnosis present

## 2021-10-21 DIAGNOSIS — F10929 Alcohol use, unspecified with intoxication, unspecified: Secondary | ICD-10-CM | POA: Diagnosis not present

## 2021-10-21 DIAGNOSIS — J449 Chronic obstructive pulmonary disease, unspecified: Secondary | ICD-10-CM | POA: Diagnosis present

## 2021-10-21 DIAGNOSIS — E119 Type 2 diabetes mellitus without complications: Secondary | ICD-10-CM | POA: Diagnosis present

## 2021-10-21 DIAGNOSIS — E876 Hypokalemia: Secondary | ICD-10-CM | POA: Diagnosis present

## 2021-10-21 DIAGNOSIS — Z720 Tobacco use: Secondary | ICD-10-CM

## 2021-10-21 DIAGNOSIS — Z7901 Long term (current) use of anticoagulants: Secondary | ICD-10-CM | POA: Diagnosis not present

## 2021-10-21 DIAGNOSIS — M6282 Rhabdomyolysis: Secondary | ICD-10-CM | POA: Diagnosis present

## 2021-10-21 DIAGNOSIS — Z23 Encounter for immunization: Secondary | ICD-10-CM | POA: Diagnosis present

## 2021-10-21 DIAGNOSIS — Z20822 Contact with and (suspected) exposure to covid-19: Secondary | ICD-10-CM | POA: Diagnosis present

## 2021-10-21 DIAGNOSIS — F10921 Alcohol use, unspecified with intoxication delirium: Secondary | ICD-10-CM | POA: Diagnosis not present

## 2021-10-21 DIAGNOSIS — S92516A Nondisplaced fracture of proximal phalanx of unspecified lesser toe(s), initial encounter for closed fracture: Secondary | ICD-10-CM | POA: Insufficient documentation

## 2021-10-21 DIAGNOSIS — R7989 Other specified abnormal findings of blood chemistry: Secondary | ICD-10-CM

## 2021-10-21 DIAGNOSIS — I10 Essential (primary) hypertension: Secondary | ICD-10-CM | POA: Diagnosis present

## 2021-10-21 DIAGNOSIS — Z86711 Personal history of pulmonary embolism: Secondary | ICD-10-CM | POA: Diagnosis not present

## 2021-10-21 DIAGNOSIS — F10129 Alcohol abuse with intoxication, unspecified: Secondary | ICD-10-CM | POA: Diagnosis present

## 2021-10-21 DIAGNOSIS — N179 Acute kidney failure, unspecified: Secondary | ICD-10-CM | POA: Diagnosis present

## 2021-10-21 DIAGNOSIS — Y905 Blood alcohol level of 100-119 mg/100 ml: Secondary | ICD-10-CM | POA: Diagnosis present

## 2021-10-21 DIAGNOSIS — R296 Repeated falls: Secondary | ICD-10-CM | POA: Diagnosis present

## 2021-10-21 DIAGNOSIS — F10931 Alcohol use, unspecified with withdrawal delirium: Secondary | ICD-10-CM | POA: Diagnosis not present

## 2021-10-21 DIAGNOSIS — W1789XA Other fall from one level to another, initial encounter: Secondary | ICD-10-CM | POA: Diagnosis present

## 2021-10-21 DIAGNOSIS — Z7984 Long term (current) use of oral hypoglycemic drugs: Secondary | ICD-10-CM | POA: Diagnosis not present

## 2021-10-21 DIAGNOSIS — F10939 Alcohol use, unspecified with withdrawal, unspecified: Secondary | ICD-10-CM | POA: Insufficient documentation

## 2021-10-21 DIAGNOSIS — Z6841 Body Mass Index (BMI) 40.0 and over, adult: Secondary | ICD-10-CM | POA: Diagnosis not present

## 2021-10-21 DIAGNOSIS — M546 Pain in thoracic spine: Secondary | ICD-10-CM | POA: Diagnosis not present

## 2021-10-21 DIAGNOSIS — G928 Other toxic encephalopathy: Secondary | ICD-10-CM | POA: Diagnosis present

## 2021-10-21 DIAGNOSIS — E872 Acidosis, unspecified: Secondary | ICD-10-CM | POA: Diagnosis present

## 2021-10-21 DIAGNOSIS — E860 Dehydration: Secondary | ICD-10-CM | POA: Diagnosis present

## 2021-10-21 LAB — CBC
HCT: 40.1 % (ref 39.0–52.0)
HCT: 42.3 % (ref 39.0–52.0)
Hemoglobin: 13.6 g/dL (ref 13.0–17.0)
Hemoglobin: 13.9 g/dL (ref 13.0–17.0)
MCH: 29.1 pg (ref 26.0–34.0)
MCH: 29.7 pg (ref 26.0–34.0)
MCHC: 32.9 g/dL (ref 30.0–36.0)
MCHC: 33.9 g/dL (ref 30.0–36.0)
MCV: 87.6 fL (ref 80.0–100.0)
MCV: 88.5 fL (ref 80.0–100.0)
Platelets: 217 10*3/uL (ref 150–400)
Platelets: 240 10*3/uL (ref 150–400)
RBC: 4.58 MIL/uL (ref 4.22–5.81)
RBC: 4.78 MIL/uL (ref 4.22–5.81)
RDW: 17 % — ABNORMAL HIGH (ref 11.5–15.5)
RDW: 17.2 % — ABNORMAL HIGH (ref 11.5–15.5)
WBC: 13.6 10*3/uL — ABNORMAL HIGH (ref 4.0–10.5)
WBC: 15.9 10*3/uL — ABNORMAL HIGH (ref 4.0–10.5)
nRBC: 0 % (ref 0.0–0.2)
nRBC: 0 % (ref 0.0–0.2)

## 2021-10-21 LAB — RAPID URINE DRUG SCREEN, HOSP PERFORMED
Amphetamines: NOT DETECTED
Barbiturates: NOT DETECTED
Benzodiazepines: NOT DETECTED
Cocaine: NOT DETECTED
Opiates: POSITIVE — AB
Tetrahydrocannabinol: NOT DETECTED

## 2021-10-21 LAB — URINALYSIS, ROUTINE W REFLEX MICROSCOPIC
Bacteria, UA: NONE SEEN
Bilirubin Urine: NEGATIVE
Glucose, UA: >=500 mg/dL — AB
Ketones, ur: NEGATIVE mg/dL
Leukocytes,Ua: NEGATIVE
Nitrite: NEGATIVE
Protein, ur: 100 mg/dL — AB
Specific Gravity, Urine: 1.024 (ref 1.005–1.030)
pH: 5 (ref 5.0–8.0)

## 2021-10-21 LAB — HEMOGLOBIN A1C
Hgb A1c MFr Bld: 7.4 % — ABNORMAL HIGH (ref 4.8–5.6)
Mean Plasma Glucose: 165.68 mg/dL

## 2021-10-21 LAB — BASIC METABOLIC PANEL
Anion gap: 11 (ref 5–15)
Anion gap: 11 (ref 5–15)
BUN: 17 mg/dL (ref 8–23)
BUN: 17 mg/dL (ref 8–23)
CO2: 21 mmol/L — ABNORMAL LOW (ref 22–32)
CO2: 21 mmol/L — ABNORMAL LOW (ref 22–32)
Calcium: 8.8 mg/dL — ABNORMAL LOW (ref 8.9–10.3)
Calcium: 8.8 mg/dL — ABNORMAL LOW (ref 8.9–10.3)
Chloride: 104 mmol/L (ref 98–111)
Chloride: 103 mmol/L (ref 98–111)
Creatinine, Ser: 1.18 mg/dL (ref 0.61–1.24)
Creatinine, Ser: 1.29 mg/dL — ABNORMAL HIGH (ref 0.61–1.24)
GFR, Estimated: >60 mL/min (ref >60)
GFR, Estimated: >60 mL/min (ref >60)
Glucose, Bld: 146 mg/dL — ABNORMAL HIGH (ref 70–99)
Glucose, Bld: 214 mg/dL — ABNORMAL HIGH (ref 70–99)
Potassium: 3.2 mmol/L — ABNORMAL LOW (ref 3.5–5.1)
Potassium: 4.2 mmol/L (ref 3.5–5.1)
Sodium: 136 mmol/L (ref 135–145)
Sodium: 135 mmol/L (ref 135–145)

## 2021-10-21 LAB — I-STAT CHEM 8, ED
BUN: 19 mg/dL (ref 8–23)
Calcium, Ion: 1.15 mmol/L (ref 1.15–1.40)
Chloride: 100 mmol/L (ref 98–111)
Creatinine, Ser: 1.5 mg/dL — ABNORMAL HIGH (ref 0.61–1.24)
Glucose, Bld: 198 mg/dL — ABNORMAL HIGH (ref 70–99)
HCT: 47 % (ref 39.0–52.0)
Hemoglobin: 16 g/dL (ref 13.0–17.0)
Potassium: 2.8 mmol/L — ABNORMAL LOW (ref 3.5–5.1)
Sodium: 137 mmol/L (ref 135–145)
TCO2: 19 mmol/L — ABNORMAL LOW (ref 22–32)

## 2021-10-21 LAB — MAGNESIUM: Magnesium: 2 mg/dL (ref 1.7–2.4)

## 2021-10-21 LAB — SAMPLE TO BLOOD BANK

## 2021-10-21 LAB — COMPREHENSIVE METABOLIC PANEL
ALT: 27 U/L (ref 0–44)
AST: 30 U/L (ref 15–41)
Albumin: 3.8 g/dL (ref 3.5–5.0)
Alkaline Phosphatase: 59 U/L (ref 38–126)
Anion gap: 18 — ABNORMAL HIGH (ref 5–15)
BUN: 18 mg/dL (ref 8–23)
CO2: 17 mmol/L — ABNORMAL LOW (ref 22–32)
Calcium: 9.3 mg/dL (ref 8.9–10.3)
Chloride: 102 mmol/L (ref 98–111)
Creatinine, Ser: 1.36 mg/dL — ABNORMAL HIGH (ref 0.61–1.24)
GFR, Estimated: 57 mL/min — ABNORMAL LOW (ref >60)
Glucose, Bld: 195 mg/dL — ABNORMAL HIGH (ref 70–99)
Potassium: 3 mmol/L — ABNORMAL LOW (ref 3.5–5.1)
Sodium: 137 mmol/L (ref 135–145)
Total Bilirubin: 0.5 mg/dL (ref 0.3–1.2)
Total Protein: 6.7 g/dL (ref 6.5–8.1)

## 2021-10-21 LAB — ETHANOL: Alcohol, Ethyl (B): 108 mg/dL — ABNORMAL HIGH (ref <10)

## 2021-10-21 LAB — PHOSPHORUS: Phosphorus: 3.3 mg/dL (ref 2.5–4.6)

## 2021-10-21 LAB — CBG MONITORING, ED
Glucose-Capillary: 165 mg/dL — ABNORMAL HIGH (ref 70–99)
Glucose-Capillary: 170 mg/dL — ABNORMAL HIGH (ref 70–99)
Glucose-Capillary: 201 mg/dL — ABNORMAL HIGH (ref 70–99)

## 2021-10-21 LAB — HIV ANTIBODY (ROUTINE TESTING W REFLEX): HIV Screen 4th Generation wRfx: NONREACTIVE

## 2021-10-21 LAB — RESP PANEL BY RT-PCR (FLU A&B, COVID) ARPGX2
Influenza A by PCR: NEGATIVE
Influenza B by PCR: NEGATIVE
SARS Coronavirus 2 by RT PCR: NEGATIVE

## 2021-10-21 LAB — PROTIME-INR
INR: 1.3 — ABNORMAL HIGH (ref 0.8–1.2)
Prothrombin Time: 16.2 seconds — ABNORMAL HIGH (ref 11.4–15.2)

## 2021-10-21 LAB — LACTIC ACID, PLASMA
Lactic Acid, Venous: 4.4 mmol/L (ref 0.5–1.9)
Lactic Acid, Venous: 4 mmol/L (ref 0.5–1.9)
Lactic Acid, Venous: 3.7 mmol/L (ref 0.5–1.9)
Lactic Acid, Venous: 1.9 mmol/L (ref 0.5–1.9)

## 2021-10-21 LAB — GLUCOSE, CAPILLARY: Glucose-Capillary: 171 mg/dL — ABNORMAL HIGH (ref 70–99)

## 2021-10-21 LAB — TROPONIN I (HIGH SENSITIVITY): Troponin I (High Sensitivity): 14 ng/L (ref <18)

## 2021-10-21 MED ORDER — HALOPERIDOL LACTATE 5 MG/ML IJ SOLN
INTRAMUSCULAR | Status: AC
  Filled 2021-10-21: qty 2

## 2021-10-21 MED ORDER — LORAZEPAM 2 MG/ML IJ SOLN: 2.0000 mg | Freq: Once | INTRAMUSCULAR | Status: DC

## 2021-10-21 MED ORDER — LORAZEPAM 2 MG/ML IJ SOLN
1.0000 mg | INTRAMUSCULAR | Status: DC | PRN
  Administered 2021-10-21: 4 mg via INTRAVENOUS
  Administered 2021-10-21: 2 mg via INTRAVENOUS
  Administered 2021-10-21: 4 mg via INTRAVENOUS
  Administered 2021-10-21: 2 mg via INTRAVENOUS
  Administered 2021-10-22: 4 mg via INTRAVENOUS
  Administered 2021-10-23 (×2): 2 mg via INTRAVENOUS
  Administered 2021-10-23: 1 mg via INTRAVENOUS
  Filled 2021-10-21: qty 1
  Filled 2021-10-21: qty 2
  Filled 2021-10-21 (×2): qty 1
  Filled 2021-10-21 (×2): qty 2
  Filled 2021-10-21 (×2): qty 1

## 2021-10-21 MED ORDER — SODIUM CHLORIDE 0.9 % IV BOLUS
1000.0000 mL | Freq: Once | INTRAVENOUS | Status: AC
  Administered 2021-10-21: 1000 mL via INTRAVENOUS

## 2021-10-21 MED ORDER — UMECLIDINIUM BROMIDE 62.5 MCG/ACT IN AEPB
1.0000 | INHALATION_SPRAY | Freq: Every day | RESPIRATORY_TRACT | Status: DC
  Administered 2021-10-23 – 2021-10-27 (×4): 1 via RESPIRATORY_TRACT
  Filled 2021-10-21 (×2): qty 7

## 2021-10-21 MED ORDER — THIAMINE HCL 100 MG PO TABS
100.0000 mg | ORAL_TABLET | Freq: Every day | ORAL | Status: DC
Start: 2021-10-21 — End: 2021-10-27
  Administered 2021-10-21 – 2021-10-27 (×7): 100 mg via ORAL
  Filled 2021-10-21 (×7): qty 1

## 2021-10-21 MED ORDER — OXYCODONE-ACETAMINOPHEN 5-325 MG PO TABS
2.0000 | ORAL_TABLET | Freq: Once | ORAL | Status: AC
  Administered 2021-10-21: 2 via ORAL
  Filled 2021-10-21: qty 2

## 2021-10-21 MED ORDER — FOLIC ACID 1 MG PO TABS
1.0000 mg | ORAL_TABLET | Freq: Every day | ORAL | Status: DC
  Administered 2021-10-21 – 2021-10-27 (×7): 1 mg via ORAL
  Filled 2021-10-21 (×7): qty 1

## 2021-10-21 MED ORDER — CHLORDIAZEPOXIDE HCL 5 MG PO CAPS
50.0000 mg | ORAL_CAPSULE | Freq: Four times a day (QID) | ORAL | Status: DC
  Administered 2021-10-21: 50 mg via ORAL
  Filled 2021-10-21: qty 2
  Filled 2021-10-21: qty 10

## 2021-10-21 MED ORDER — ACETAMINOPHEN 500 MG PO TABS
1000.0000 mg | ORAL_TABLET | Freq: Four times a day (QID) | ORAL | Status: DC | PRN
  Administered 2021-10-21 – 2021-10-26 (×8): 1000 mg via ORAL
  Filled 2021-10-21 (×10): qty 2

## 2021-10-21 MED ORDER — HALOPERIDOL LACTATE 5 MG/ML IJ SOLN
5.0000 mg | Freq: Once | INTRAMUSCULAR | Status: AC
  Administered 2021-10-21: 5 mg via INTRAVENOUS
  Filled 2021-10-21: qty 1

## 2021-10-21 MED ORDER — IOHEXOL 300 MG/ML  SOLN
100.0000 mL | Freq: Once | INTRAMUSCULAR | Status: AC | PRN
  Administered 2021-10-21: 100 mL via INTRAVENOUS

## 2021-10-21 MED ORDER — SODIUM CHLORIDE 0.9 % IV SOLN: INTRAVENOUS | Status: AC

## 2021-10-21 MED ORDER — POTASSIUM CHLORIDE 20 MEQ PO PACK
40.0000 meq | PACK | Freq: Two times a day (BID) | ORAL | Status: AC
  Administered 2021-10-21 (×2): 40 meq via ORAL
  Filled 2021-10-21 (×2): qty 2

## 2021-10-21 MED ORDER — HALOPERIDOL LACTATE 5 MG/ML IJ SOLN
10.0000 mg | Freq: Once | INTRAMUSCULAR | Status: AC
  Administered 2021-10-21: 10 mg via INTRAVENOUS
  Filled 2021-10-21: qty 2

## 2021-10-21 MED ORDER — LORAZEPAM 2 MG/ML IJ SOLN
2.0000 mg | Freq: Once | INTRAMUSCULAR | Status: AC
  Administered 2021-10-21: 2 mg via INTRAVENOUS
  Filled 2021-10-21: qty 1

## 2021-10-21 MED ORDER — POTASSIUM CHLORIDE CRYS ER 20 MEQ PO TBCR
40.0000 meq | EXTENDED_RELEASE_TABLET | Freq: Once | ORAL | Status: AC
  Administered 2021-10-21: 40 meq via ORAL
  Filled 2021-10-21: qty 2

## 2021-10-21 MED ORDER — ADULT MULTIVITAMIN W/MINERALS CH
1.0000 | ORAL_TABLET | Freq: Every day | ORAL | Status: DC
  Administered 2021-10-21 – 2021-10-27 (×7): 1 via ORAL
  Filled 2021-10-21 (×7): qty 1

## 2021-10-21 MED ORDER — INSULIN ASPART 100 UNIT/ML IJ SOLN
0.0000 [IU] | Freq: Three times a day (TID) | INTRAMUSCULAR | Status: DC
  Administered 2021-10-22 (×2): 1 [IU] via SUBCUTANEOUS
  Administered 2021-10-23: 2 [IU] via SUBCUTANEOUS
  Administered 2021-10-23 – 2021-10-24 (×4): 1 [IU] via SUBCUTANEOUS
  Administered 2021-10-25 (×2): 2 [IU] via SUBCUTANEOUS
  Administered 2021-10-25 – 2021-10-26 (×2): 1 [IU] via SUBCUTANEOUS

## 2021-10-21 MED ORDER — LORAZEPAM 2 MG/ML IJ SOLN
1.0000 mg | Freq: Once | INTRAMUSCULAR | Status: AC
  Administered 2021-10-21: 1 mg via INTRAVENOUS
  Filled 2021-10-21: qty 1

## 2021-10-21 MED ORDER — ALBUTEROL SULFATE HFA 108 (90 BASE) MCG/ACT IN AERS
2.0000 | INHALATION_SPRAY | Freq: Four times a day (QID) | RESPIRATORY_TRACT | Status: DC | PRN
Start: 2021-10-21 — End: 2021-10-21

## 2021-10-21 MED ORDER — THIAMINE HCL 100 MG/ML IJ SOLN
100.0000 mg | Freq: Every day | INTRAMUSCULAR | Status: DC
Start: 2021-10-21 — End: 2021-10-27

## 2021-10-21 MED ORDER — FLUTICASONE FUROATE-VILANTEROL 100-25 MCG/ACT IN AEPB
1.0000 | INHALATION_SPRAY | Freq: Every day | RESPIRATORY_TRACT | Status: DC
  Administered 2021-10-23 – 2021-10-27 (×4): 1 via RESPIRATORY_TRACT
  Filled 2021-10-21 (×2): qty 28

## 2021-10-21 MED ORDER — MORPHINE SULFATE (PF) 4 MG/ML IV SOLN
4.0000 mg | Freq: Once | INTRAVENOUS | Status: AC
  Administered 2021-10-21: 4 mg via INTRAVENOUS
  Filled 2021-10-21: qty 1

## 2021-10-21 MED ORDER — HALOPERIDOL LACTATE 5 MG/ML IJ SOLN
10.0000 mg | Freq: Once | INTRAMUSCULAR | Status: AC
  Administered 2021-10-21: 10 mg via INTRAVENOUS

## 2021-10-21 MED ORDER — LORAZEPAM 1 MG PO TABS: 0.0000 mg | ORAL_TABLET | Freq: Three times a day (TID) | ORAL | Status: DC

## 2021-10-21 MED ORDER — ALBUTEROL SULFATE (2.5 MG/3ML) 0.083% IN NEBU
2.5000 mg | INHALATION_SOLUTION | Freq: Four times a day (QID) | RESPIRATORY_TRACT | Status: DC | PRN
  Administered 2021-10-21: 2.5 mg via RESPIRATORY_TRACT
  Filled 2021-10-21: qty 3

## 2021-10-21 MED ORDER — NICOTINE 21 MG/24HR TD PT24
21.0000 mg | MEDICATED_PATCH | Freq: Every day | TRANSDERMAL | Status: DC
Start: 2021-10-21 — End: 2021-10-27
  Administered 2021-10-21 – 2021-10-27 (×7): 21 mg via TRANSDERMAL
  Filled 2021-10-21 (×7): qty 1

## 2021-10-21 MED ORDER — LORAZEPAM 1 MG PO TABS
0.0000 mg | ORAL_TABLET | ORAL | Status: DC
  Administered 2021-10-22: 1 mg via ORAL
  Administered 2021-10-22: 3 mg via ORAL
  Administered 2021-10-22 (×2): 1 mg via ORAL
  Filled 2021-10-21: qty 1

## 2021-10-21 MED ORDER — RIVAROXABAN 20 MG PO TABS: 20.0000 mg | ORAL_TABLET | Freq: Every day | ORAL | Status: DC

## 2021-10-21 MED ORDER — LORAZEPAM 1 MG PO TABS
1.0000 mg | ORAL_TABLET | ORAL | Status: DC | PRN
  Administered 2021-10-22 (×3): 1 mg via ORAL
  Administered 2021-10-23: 2 mg via ORAL
  Filled 2021-10-21: qty 2
  Filled 2021-10-21: qty 3
  Filled 2021-10-21 (×5): qty 1

## 2021-10-21 MED ORDER — RIVAROXABAN 10 MG PO TABS: 20.0000 mg | ORAL_TABLET | Freq: Every day | ORAL | Status: DC

## 2021-10-21 MED ORDER — CHLORDIAZEPOXIDE HCL 5 MG PO CAPS: 25.0000 mg | ORAL_CAPSULE | Freq: Four times a day (QID) | ORAL | Status: DC

## 2021-10-21 MED ORDER — CYCLOBENZAPRINE HCL 10 MG PO TABS
10.0000 mg | ORAL_TABLET | Freq: Three times a day (TID) | ORAL | Status: DC | PRN
  Administered 2021-10-21 – 2021-10-27 (×4): 10 mg via ORAL
  Filled 2021-10-21 (×5): qty 1

## 2021-10-21 NOTE — ED Notes (Signed)
BIB [facility]. Pt was sitting on a 6-79ft retaning wall and fell off to concrete and dirt. Unsure if pt had LOC. Pt does have dirt and gravel on face at this time. Pt has 10cm abrasion to lt scalp with mild bleeding. Generally altered, admitted to ETOH use. Moving all extremities, diffuse abd  pain, no bruising noted, anterior chest discomfort bilat. Lungs equal bilat all lobes, hip tenderness bilat. Pt was in a c-collar on arrival however pt was yelling and screaming for it to be removed. ED provider removed same.

## 2021-10-21 NOTE — ED Notes (Signed)
Returned from ct at this time ?

## 2021-10-21 NOTE — ED Notes (Signed)
Pt is hollering out c/o pain in his head and neck that the pain meds have warn off and he needs something for pain

## 2021-10-21 NOTE — ED Notes (Signed)
[place] c-collar placed on pt at this time

## 2021-10-21 NOTE — Assessment & Plan Note (Signed)
1 pack per day since age 66. Still smoking. -Nicotine patch

## 2021-10-21 NOTE — ED Notes (Signed)
3 MD's at bedside consulting for higher level of care NT sitting for 1;1

## 2021-10-21 NOTE — ED Notes (Signed)
Pt has again pulled off all cords and actively trying to pull himself out of bed

## 2021-10-21 NOTE — Consult Note (Signed)
NAME:  Ernest White, MRN:  [id number], DOB:  1956/01/30, LOS: 0 ADMISSION DATE:  10/20/2021, CONSULTATION DATE:  10/21/21 REFERRING MD:  Joelyn Oms -- FMTS, CHIEF COMPLAINT:  AMS   History of Present Illness:  66 yo M PMH copd with asthma, DM, etoh abuse presented to [facility] 8/12 early morning after sustaining a fall while intoxicated. Level 2 trauma, fortunately imaging did not reveal an acute injury -- just abrasions.  Admitted to [facility] for management and care.  Later on 8/12 pt became increasingly agitated. He received multipl doses of haldol and 7mg  ativan PCCM consulted for consideration of precedex   Pertinent  Medical History  Copd/asthma  Etoh use Obesity   Significant Hospital Events: Including procedures, antibiotic start and stop dates in addition to other pertinent events   8/12 fell while drinking. Admit to [facility] for AMS, AKI elevated LA. Incr agitation, PCCM consulted   Interim History / Subjective:  Received additional 2mg  ativan   Objective   Blood pressure 94/62, pulse (!) 108, temperature 97.8 F (36.6 C), temperature source Oral, resp. rate (!) 22, height 5\' 11"  (1.803 m), weight (!) 138.8 kg, SpO2 (!) 86 %.        Intake/Output Summary (Last 24 hours) at 10/21/2021 1419 Last data filed at 10/21/2021 [id number] Gross per 24 hour  Intake 2048.95 ml  Output --  Net 2048.95 ml   Filed Weights   10/21/21 0052  Weight: (!) 138.8 kg    Examination: General: Obese adult M NAD  HENT: abrasion on nose. pink mm anicteric sclera Lungs: Diminished basilar sounds, even and unlabored  Cardiovascular: 2+ pulses. Cap refill brisk  Abdomen: obese soft ndnt  Extremities: no acute deformity  Neuro: oriented to self. Following commands. PERRLA 49mm  GU: wnl  Resolved Hospital Problem list     Assessment & Plan:   Acute encephalopathy in setting of etoh abuse  -seems to have responded well to the additional BZD dose, on my arrival he is disoriented but cooperative and calm   -exam does not suggest development intracranial process P -CIWA with BZD support  -Micronutrient support -cessation resources when appropriate  -does not need ICU or dex at this time -serial neuro exam -CCM MD will follow behind me this afternoon to ensure he has remained appropriate for progressive and I am not catching him at a good moment    Tobacco use disorder COPD/Asthma AKI Hypokalemia DM Elevated LA -per primary team    PCCM will sign off Please reengage if pts clinical status changes or if we can be of further assistance    Best Practice (right click and "Reselect all SmartList Selections" daily)   Per primary   Labs   CBC: Recent Labs  Lab 10/20/21 2350 10/21/21 0004 10/21/21 1037  WBC 13.6*  --  15.9*  HGB 13.9 16.0 13.6  HCT 42.3 47.0 40.1  MCV 88.5  --  87.6  PLT 240  --  [id number]    Basic Metabolic Panel: Recent Labs  Lab 10/20/21 2350 10/21/21 0004 10/21/21 0424 10/21/21 1037  NA 137 137 136 135  K 3.0* 2.8* 3.2* 4.2  CL 102 100 104 103  CO2 17*  --  21* 21*  GLUCOSE 195* 198* 146* 214*  BUN 18 19 17 17   CREATININE 1.36* 1.50* 1.18 1.29*  CALCIUM 9.3  --  8.8* 8.8*  MG  --   --   --  2.0  PHOS  --   --   --  3.3   GFR: Estimated Creatinine Clearance: 79.1 mL/min (A) (by C-G formula based on SCr of 1.29 mg/dL (H)). Recent Labs  Lab 10/20/21 2350 10/21/21 0421 10/21/21 1037 10/21/21 1041  WBC 13.6*  --  15.9*  --   LATICACIDVEN 4.4* 4.0*  --  3.7*    Liver Function Tests: Recent Labs  Lab 10/20/21 2350  AST 30  ALT 27  ALKPHOS 59  BILITOT 0.5  PROT 6.7  ALBUMIN 3.8   No results for input(s): "LIPASE", "AMYLASE" in the last 168 hours. No results for input(s): "AMMONIA" in the last 168 hours.  ABG    Component Value Date/Time   TCO2 19 (L) 10/21/2021 0004     Coagulation Profile: Recent Labs  Lab 10/20/21 2350  INR 1.3*    Cardiac Enzymes: No results for input(s): "CKTOTAL", "CKMB", "CKMBINDEX", "TROPONINI" in  the last 168 hours.  HbA1C: HbA1c, POC (controlled diabetic range)  Date/Time Value Ref Range Status  06/17/2019 08:55 AM 8.5 (A) 0.0 - 7.0 % Final  01/22/2019 01:40 PM 8.1 (A) 0.0 - 7.0 % Final   Hgb A1c MFr Bld  Date/Time Value Ref Range Status  10/21/2021 10:38 AM 7.4 (H) 4.8 - 5.6 % Final    Comment:    (NOTE) Pre diabetes:          5.7%-6.4%  Diabetes:              >6.4%  Glycemic control for   <7.0% adults with diabetes     CBG: Recent Labs  Lab 10/21/21 0011 10/21/21 0746  GLUCAP 201* 165*    Review of Systems:   Unable to obtain due to encephalopathy   Past Medical History:  He,  has a past medical history of Arthritis, COPD (chronic obstructive pulmonary disease) (HCC), Diabetes mellitus without complication (HCC), Gout, and Hypertension.   Surgical History:   Past Surgical History:  Procedure Laterality Date   APPENDECTOMY     HERNIA REPAIR       Social History:   reports that he quit smoking about a year ago. His smoking use included cigarettes. He has a 48.00 pack-year smoking history. He has never used smokeless tobacco. He reports current alcohol use of about 1.0 standard drink of alcohol per week. He reports that he does not use drugs.   Family History:  His family history includes Cancer in his sister and sister; Cancer - Lung in his father; Deep vein thrombosis in his brother; Heart disease in his sister; Other in his sister; Pulmonary embolism in his brother.   Allergies Allergies  Allergen Reactions   Penicillins Anaphylaxis    Has patient had a PCN reaction causing immediate rash, facial/tongue/throat swelling, SOB or lightheadedness with hypotension: Yes Has patient had a PCN reaction causing severe rash involving mucus membranes or skin necrosis: No Has patient had a PCN reaction that required hospitalization pt was hospitalized at time of reaction Has patient had a PCN reaction occurring within the last 10 years: No If all of the above  answers are "NO", then may proceed with Cephalosporin use.   Aspirin Other (See Comments)    Stomach pain     Home Medications  Prior to Admission medications   Medication Sig Start Date End Date Taking? Authorizing Provider  acetaminophen (TYLENOL) 500 MG tablet Take 1,000 mg by mouth every 6 (six) hours as needed for mild pain.    [provider]  albuterol (VENTOLIN HFA) 108 (90 Base) MCG/ACT inhaler INHALE 2 PUFFS  INTO THE LUNGS EVERY 6 HOURS AS NEEDED FOR WHEEZING OR SHORTNESS OF BREATH Patient taking differently: Inhale 2 puffs into the lungs every 6 (six) hours as needed for wheezing or shortness of breath. 04/18/21   Coralyn Helling, MD  atorvastatin (LIPITOR) 20 MG tablet Take 20 mg by mouth every evening. 10/04/21   [provider]  diclofenac Sodium (VOLTAREN) 1 % GEL Apply 2 g topically 4 (four) times daily. 04/17/21   Jeannie Fend, PA-C  Fluticasone-Umeclidin-Vilant (TRELEGY ELLIPTA) 100-62.5-25 MCG/ACT AEPB Inhale 1 puff into the lungs daily. Patient not taking: Reported on 10/21/2021 04/17/21   Coralyn Helling, MD  gabapentin (NEURONTIN) 300 MG capsule Take 300 mg by mouth at bedtime. 10/02/21   [provider]  glipiZIDE (GLUCOTROL XL) 2.5 MG 24 hr tablet Take 2.5 mg by mouth daily. 08/20/19   [provider]  hydrochlorothiazide (HYDRODIURIL) 25 MG tablet TAKE 1 TABLET(25 MG) BY MOUTH DAILY Patient taking differently: Take 25 mg by mouth daily. 05/11/20   Simmons-Robinson, Makiera, MD  hydrocortisone (ANUSOL-HC) 2.5 % rectal cream Place 1 application rectally 2 (two) times daily. 06/26/19   Darin Engels, Sherin, DO  JANUVIA 100 MG tablet Take 100 mg by mouth daily. 10/11/21   [provider]  JARDIANCE 25 MG TABS tablet TAKE 1 TABLET(25 MG) BY MOUTH DAILY Patient taking differently: Take 25 mg by mouth daily. 08/29/21   Simmons-Robinson, Makiera, MD  lidocaine (LIDODERM) 5 % Place 1 patch onto the skin daily. Remove & Discard patch within 12 hours or as  directed by MD 04/17/21   Jeannie Fend, PA-C  Lidocaine 2 % GEL Apply 2 application topically 2 (two) times daily. 04/18/21   Margarita Grizzle, MD  lisinopril (ZESTRIL) 40 MG tablet TAKE 1 TABLET(40 MG) BY MOUTH DAILY Patient taking differently: Take 40 mg by mouth daily. 09/05/20   Simmons-Robinson, Makiera, MD  metFORMIN (GLUCOPHAGE) 1000 MG tablet TAKE 1 TABLET BY MOUTH TWICE DAILY WITH A MEAL Patient not taking: Reported on 10/21/2021 05/27/19   Oralia Manis, DO  metFORMIN (GLUCOPHAGE) 500 MG tablet Take 500 mg by mouth 2 (two) times daily. 08/20/19   [provider]  oxyCODONE (ROXICODONE) 5 MG immediate release tablet Take 1 tablet (5 mg total) by mouth every 6 (six) hours as needed for up to 15 doses for severe pain. Patient taking differently: Take 5 mg by mouth 3 (three) times daily as needed for severe pain. 10/11/20   Terald Sleeper, MD  SYMBICORT 160-4.5 MCG/ACT inhaler Inhale 2 puffs into the lungs 2 (two) times daily. 10/03/21   [provider]  topiramate (TOPAMAX) 25 MG tablet Take 50 mg by mouth 2 (two) times daily. 10/02/21   [provider]  vitamin B-12 (CYANOCOBALAMIN) 1000 MCG tablet Take 1,000 mcg by mouth daily.    [provider]  XARELTO 20 MG TABS tablet TAKE 1 TABLET BY MOUTH DAILY WITH A MEAL Patient taking differently: Take 20 mg by mouth daily with supper. 09/01/20   Simmons-Robinson, Tawanna Cooler, MD     Critical care time: n/a        Tessie Fass MSN, AGACNP-BC [facility] Pulmonary/Critical Care Medicine Amion for pager  10/21/2021, 2:41 PM

## 2021-10-21 NOTE — ED Notes (Signed)
Attending notified that pt is wheezing and would like home neb treatment

## 2021-10-21 NOTE — ED Notes (Signed)
Pt was log rolled at this time. Tenderness to thoracic and lumbar sections

## 2021-10-21 NOTE — Progress Notes (Addendum)
FMTS Interim Progress Note  S:Received page from RN about patient becoming more agitated and pulling out both IV's. MD to bedside, observed the patient sleeping. Awoke more easily to sternal rub, began speaking although speech somewhat slurred and unintelligible. As he awoke more, started moving in bed, saying he wanted to get up and walk around, wanted to go home.   RN and sitter at bedside report patient's consciousness has been waxing and waning.   O: BP 136/79 (BP Location: Left Arm)   Pulse (!) 105   Temp 99.2 F (37.3 C) (Oral)   Resp 20   Ht 5\' 11"  (1.803 m)   Wt (!) 138.8 kg   SpO2 97%   BMI 42.68 kg/m   Gen: somewhat awake, verbalizing with varied degrees of enunciation, no acute distress  A/P: ETOH withdrawal As patient is more awake and likely able to take PO, will proceed with librium as planned. Once patient more sedated, will insert new PIV to be used for IV meds PRN. Will return in approx 2 hours to see how well librium working for patient. Low threshold to abandon librium and move to ativan only.   , MD 10/21/2021, 9:32 PM PGY-3, [facility] Medicine Service pager [phone]

## 2021-10-21 NOTE — ED Notes (Signed)
Pt is currently yelling, raising his voice, and using foul language with this RN about removing the c-collar. Attempted to discuss with pt the importance of leaving the c-collar in place. Pt educated about the importance of leaving c-collar on and that if he anything happens further complications that he would be held responsible for further damaged due to not following protocol of what needs to be done. Spoke with Dr. Jodi Mourning advised to remove the c-collar and it was at this time. Pt now attempting to refuse chest -xray. Pt is requesting a pillow and to be raised up in the bed. Educated pt on the reason not to do so but continues to want to sit up, have a pillow, and c-collar removed. X-ray is now finished and pt wants the bed put up. Pt educated again on the importance of being still however pt is continuing to argue and fuss with the nurse at this time and is tossing and turning in the bed. X-ray is completed. Provider aware of this at this time

## 2021-10-21 NOTE — ED Notes (Signed)
Spoke with provider at this time reference pt agitated and not staying still in the bed and sitting straight up in the bed. When this RN and provider returned to room pt is laying down and is now requesting to have his neck brace removed. Radiology at bedside at this time

## 2021-10-21 NOTE — Progress Notes (Addendum)
Alerted by RN and EDP that patient was becoming increasingly agitated/disoriented and was starting to show signs of respiratory distress. Assessed patient at bedside alongside Drs. Jennette Kettle and Tan and noted that patient was diaphoretic and had a new bilateral tremor of the upper extremity. Given progression of symptoms, now suspect withdrawals as primary driver of patient's presentation. Observed patient moving all extremities independently and with good strength. Pupils ~19mm, equal, though minimally reactive.  Patient has received 7mg  of Ativan total in addition to 25mg  of Haldol given for acute agitation earlier. CIWA now 43. Given rapidly progressive symptoms and worsening respiratory status, have discussed case with CCM. They will see patient as he is high risk for needing precedex for his apparent withdrawals.  -2mg  Ativan just administered -1-to-1 sitter ordered -CIWA with Ativan -CCM to see, high risk of needing precedex -Lower concern for intracranial bleed given negative head imaging on admission and non-focal neuro exam, but could consider repeat imaging if able to get patient's agitation under better control  , MD

## 2021-10-21 NOTE — ED Notes (Signed)
Monitors on with exception of pulse ox I have changed probe 3 times will look for new cable to assess that

## 2021-10-21 NOTE — ED Notes (Signed)
Charge nurse aware of situation, MD aware of situation, NT is going to come sit with the pt. For safety I cannot leave this pt alone he is regressing and getting more agitated

## 2021-10-21 NOTE — Assessment & Plan Note (Signed)
K 2.8 -> 3.2. -Replete w/ KCl x2 doses

## 2021-10-21 NOTE — ED Notes (Signed)
Admit team at bedside.

## 2021-10-21 NOTE — ED Notes (Signed)
Pt has pulled all leads off again. MD is aware

## 2021-10-21 NOTE — Assessment & Plan Note (Addendum)
Cr to 1.5 on admission. Now decreased to 1.02 which is about his baseline.

## 2021-10-21 NOTE — ED Notes (Signed)
RT at bedside attempting to give treatment pt is wheezing and c/o of chest tightness.  New iv has been placed, pillows given I have adjusted the pt head and head multiple multiple multiple times up and down.  Monitor has been replaced pt verbalizes understanding of the importance in keeping it on and staying in the hospital RT is at bedside now MD aware that pt wants to sign AMA

## 2021-10-21 NOTE — ED Notes (Signed)
Attempted to educate pt multiple times about the importance of laying still with the c-collar in place. Pt is refusing. He is now sitting straight up in bed and will not lay back. States that he knows that his back isn't broke. Attempted again to educate pt on staying in a supine position with head still until results were returned.

## 2021-10-21 NOTE — Assessment & Plan Note (Addendum)
Level 2 trauma. Imaging negative for acute traumatic injury. Hematoma w/ abrasions on left side of head. Has some residual neck pain. Received Tdap and fentanyl x1, oxycodone x1, and morphine x1.  -PRN flexeril, Tylenol pain control

## 2021-10-21 NOTE — ED Notes (Signed)
Security called pt is very sleepy and continues to pull off all his leads and threatening to leave.  I have asked him if he has a ride and asked how he would get home as the MD is aware and if he can sign the AMA form he can go. MD paged additional medication orders placed.  Safety sitter has been placed and MD aware that keeping a monitor on him is not possible at this time.  After more medications will assess the need for restraints.

## 2021-10-21 NOTE — ED Notes (Signed)
ED. Dr. Jodi Mourning is at bedside at this time

## 2021-10-21 NOTE — ED Notes (Signed)
Patient transported to CT 

## 2021-10-21 NOTE — ED Notes (Addendum)
MD at bedside pt was up standing at the end of the bed naked pulled all his leads off again trying to walk out.  OT at bedside helping to get pt back in the bed. Dr. Is aware and pt. Is going to stay larger bed being ordered meds placed I will go give now.  Bed alarm in place

## 2021-10-21 NOTE — Progress Notes (Signed)
FMTS Interim Progress Note  S: Checked in on patient, found him to be sleeping soundly in bed.  Patient quite difficult to wake up to tactile and verbal stimulation.  After sternal rub, patient moaning attempted to verbalize something and attempted to open eyes.  Unable to make out what he was saying.  Chart review indicates patient received 2 mg Ativan around 4 PM for CIWA score of 11.  He next received 4 mg of Ativan around 6 PM with no CIWA as documented.  P.m. bedside RN Raynelle Fanning reports daytime nurse did tell her the CIWA's were higher, but must not have charted them.  O: BP 136/79 (BP Location: Left Arm)   Pulse (!) 105   Temp 99.2 F (37.3 C) (Oral)   Resp 20   Ht 5\' 11"  (1.803 m)   Wt (!) 138.8 kg   SpO2 97%   BMI 42.68 kg/m   GEN: Sleepy, difficult to wake HEENT: Pinpoint pupils, minimally reactive to light, equal bilaterally, sclera anicteric Cardiac: Brisk cap refill, skin warm to touch, BP normal, pulse slightly tachycardic 105-110 Respiratory: Protecting airway, SPO2 98%, regular respiratory rate  A/P: Sleepiness, difficulty waking Likely due to 4 mg Ativan received around 6 PM.  Unfortunately both the nurse and myself are new to this patient so I have not personally seen his baseline.  Has undergone 2 CT head last 24 hours, both reassuring without evidence of bleed. - Hold Ativan - Will follow-up soon to reevaluate   , MD 10/21/2021, 8:44 PM PGY-3, [facility] Service pager [phone]

## 2021-10-21 NOTE — ED Notes (Signed)
This patient yelled out. This EMT went to check on the patient. The patient was verbally aggressive stating that "the bed was not fixed right. Fix the blankets." This EMT asked the patient to use a kinder tone of voice. The patient continued to be verbally aggressive asking this EMT to let him stand up while the bed was being fixed. For the patient's safety he was told that he could not get up. He stated that he had gotten out of bed before. This EMT informed the patient that this EMT would have to ask the nurse if he could get up. The patient then said, "Fuck that nurse." This EMT told the patient that he could not talk about the staff in such a manner. The patient continued to insist that he was going to get up out of the bed. The patient continued to yell out stating he had to go to the bathroom. The patient's bed alarm went off, this EMT came down to the room to see this patient on the end of the bed. The patient began yelling about needing a urinal. The patient was given a urinal. The admitting doctor came in to talk to the patient after this EMT informed him and the nurse about the incident.

## 2021-10-21 NOTE — ED Notes (Signed)
Pt tossing and turning in bed pulled suction off the male purewick and he had urinated in bed. Pt cleaned and changed at this time and connected purewick connected back at this time. Pt also provided a pillow at this time

## 2021-10-21 NOTE — ED Notes (Signed)
Dr Zavitz at bedside  

## 2021-10-21 NOTE — Assessment & Plan Note (Addendum)
Held metformin due to elevated lactate on admission. On Jardiance and glipizide at home. Hgb A1c 7.4. -Restarted metformin, Jardiance, dc'd glipizide

## 2021-10-21 NOTE — ED Notes (Signed)
Pt call light on. Went to pt room and found pt standing at the foot of the bed naked. All monitor cables removed. PIV removed in the lt hand. Male purewick removed. ED provider made aware at this time

## 2021-10-21 NOTE — Progress Notes (Signed)
Received patient from ED.  Patient is screaming "help" and moving arms.  Bed alarm is set and 3/4 side rails are raised.  Will continue to monitor.

## 2021-10-21 NOTE — ED Notes (Signed)
Date and time results received: 10/21/21 0112 (use smartphrase ".now" to insert current time)  Test: LA  Critical Value: 4.4   Name of Provider Notified: Dr Jodi Mourning  Orders Received? Or Actions Taken?:

## 2021-10-21 NOTE — ED Notes (Signed)
Breakfast order placed ?

## 2021-10-21 NOTE — ED Notes (Signed)
MD at bedside. 

## 2021-10-21 NOTE — ED Notes (Signed)
Admit providers remain in room at this time

## 2021-10-21 NOTE — ED Notes (Signed)
Pt continues to toss and turn in the bed stating all God all God what have I done up in here. Attempted to educate pt on the importance to be still. Pt is also requesting 2 pair of socks. We offered 1 pair of sock and gave him 3 warm blankets. Pt is also requesting something else for pain.

## 2021-10-21 NOTE — Progress Notes (Addendum)
FMTS Interim Progress Note  S: Returned to room to check in on patient. First dose Librium @ 2200, about 1.5 hours ago. Patient still agitated. RN and sitter note patient intermittently agitated and trying to pull things off, placed mitts. Patient begins to verbalize loudly that "it's not him", "it starts in my neck", "get these gloves off so I can breathe" and other sayings. Patient able to verbalize clearly enough to decline raising the head of his bed, clarify that his breathing is "just fine", and decline blanket.   O: BP 136/79 (BP Location: Left Arm)   Pulse (!) 105   Temp 99.2 F (37.3 C) (Oral)   Resp 20   Ht 5\' 11"  (1.803 m)   Wt (!) 138.8 kg   SpO2 97%   BMI 42.68 kg/m    A/P: ETOH withdrawal Librium without much effect, will switch to Ativan scheduled+PRN. Pharmacy consulted, soonest to start Ativan is midnight. Plan to place new PIV once patient less agitated.   , MD 10/21/2021, 11:41 PM PGY-3, [facility] Service pager [phone]

## 2021-10-21 NOTE — Assessment & Plan Note (Addendum)
Unclear when last drink was but ethanol level was 108 on admission 8/11 at 2350. CIWA overnight 0-1. No longer on scheduled Ativan. Received no Ativan overnight but received multiple doses during his stay. His cognition and agitation have greatly improved.  -Librium taper due to large amount of Ativan given during his stay -Psych consulted, appreciate recs -Progressive status for close monitoring -DC restraints, patient may ambulate at bedside. May restart if patient becomes violent  -Sitter at bedside -CIWA protocol with Ativan to be used PRN q2h -Continue thiamine, folate

## 2021-10-21 NOTE — ED Notes (Signed)
Pt hollering and using foul language at this time with this RN and the NT. Pt wants to get up and stand out of bed for awhile. States that his neck is hurting because he is laying on his neck. Pt advised that we would need assistance to get him out of bed to make sure that he doesn't fall.Marland Kitchen attempted to educate pt on the importance of staying in bed. When walked away from room pt continues to use foul language and talk rude and hateful with staff. While at nurses station pt called out twice stating that he wanted to stand up. Another nurse went to bed side and attempted to educate pt on staying in bed and pt continues to yell and scream at this time

## 2021-10-21 NOTE — ED Notes (Signed)
Safety sitter at bedside (NT, ED staff)

## 2021-10-21 NOTE — H&P (Addendum)
Hospital Admission History and Physical Service Pager: [phone]  Patient name: Ernest White Medical record number: [id number] Date of Birth: October 18, 1955 Age: 66 y.o. Gender: male  Primary Care Provider: Erick Alley, DO Consultants: n/a Code Status: FULL Preferred Emergency Contact:  Contact Information     Name Relation Home Work Mobile   HAMPTON,GLENDA Carney Bern Sister [phone]     Carmel Sacramento Significant other   [phone]        Chief Complaint: fall   Assessment and Plan: [name] is a 66 y.o. male presenting as a Level 2 trauma s/p fall from 47ft with abrasion/hematoma to head, negative traumatic workup but found to have acute ethanol intoxication with significant lab abnormalities. AKI with Cr 1.5 (baseline 1), as well as elevated lactate to 4.4. Suspect dehydration and ethanol intoxication to be major causes. Will admit for management of his AKI and electrolytes, and pain control for injuries.  * AKI (acute kidney injury) (HCC) Cr to 1.5 on admission.  -f/u BMP -NaCl mIVF 180mL/hr  Trauma Level 2 trauma. Imaging negative for acute traumatic injury. Hematoma w/ abrasions on left side of head. Has some residual neck pain. Received Tdap and fentanyl x1, oxycodone x1, and morphine x1.  -PRN flexeril, Tylenol pain control  Acute alcohol intoxication (HCC) Blood ethanol level 108 on admission, acutely intoxicated with slurred speech and agitation. Denies additional drug use. S/p haldol x1 and ativan x1. -F/u UDS -CIWA scoring  Elevated lactic acid level Lactate 4.4 on admission. Likely 2/2 AKI in the setting of alcohol intoxication/dehydration. -f/u VBG -hold metformin  Hypokalemia K 2.8 -> 3.2. -Replete w/ KCl x2 doses  Tobacco use 1 pack per day since age 25. Still smoking. -Nicotine patch  Diabetes (HCC) Holding metformin due to elevated lactate. On Jardiance and glipizide at home. -SSI, hold home meds -A1c       FEN/GI:  heart healthy diet VTE Prophylaxis: home xarelto  Disposition: home pending clinical improvement  History of Present Illness:  Ernest White is a 66 y.o. male presenting with fall.  He was brought in by EMS after apparently falling 7 feet and had sustained a head injury. Patient does not remember the details of the fall. Patient reports he drank a half of a pint of liquor (cognac) on ice earlier tonight. He reports he only drinks on weekends. Reports he has been eating and drinking normally the past few days. Denies nausea, vomiting, diarrhea, abdominal pain, chest pain.  In the ED, imaging as below was negative for acute traumatic injuries. Was acutely intoxicated with ethanol level 108. Received haldol x1 for agitation, as well as fentanyl, morphine, and oxycodone for pain control. Received NS. Lab abnormalities as below.     Review Of Systems: Per HPI with the following additions:  Review of Systems  Cardiovascular:  Negative for chest pain.  Gastrointestinal:  Negative for abdominal pain, diarrhea, nausea and vomiting.     Pertinent Past Medical History: COPD  HTN Denies history of heart disease or kidney disease Remainder reviewed in history tab.   Pertinent Past Surgical History: Reviewed in history tab.  Pertinent Social History: Tobacco use: Yes/No/Former. 1 ppd smoker. Started smoking at age 68. Alcohol use: yes Other Substance use: denies Lives alone  Pertinent Family History: Reviewed in history tab.   Important Outpatient Medications: Metformin, Xarelto  Remainder reviewed in medication history.   Objective: BP (!) 122/93   Pulse (!) 121   Temp 97.6 F (36.4 C) (  Oral)   Resp 18   Ht 5\' 11"  (1.803 m)   Wt (!) 138.8 kg   SpO2 92%   BMI 42.68 kg/m  Exam: General: Awake, alert, responsive, sitting up in bed with fan running HEENT: 10cm hematoma and abrasion over left side of scalp Cardiovascular: Tachycardic, regular rhythm Respiratory: CTAB,  normal WOB Gastrointestinal: Soft, NT/ND MSK: moves all extremities Neuro: A&Ox3 Psych: Intermittently agitated but redirectable   Labs:  CBC BMET  Recent Labs  Lab 10/20/21 2350 10/21/21 0004  WBC 13.6*  --   HGB 13.9 16.0  HCT 42.3 47.0  PLT 240  --    Recent Labs  Lab 10/21/21 0424  NA 136  K 3.2*  CL 104  CO2 21*  BUN 17  CREATININE 1.18  GLUCOSE 146*  CALCIUM 8.8*     Pertinent additional labs   Ethanol 108 Lactate 4.4 PT/INR 16.2/1.3 UA: glucosuria, proteinuria, large hgb   EKG: NSR, sinus tachycardia   Imaging Studies Performed:  CT CHEST ABD PELVIS Radiologist impression: 1. No acute post-traumatic abnormalities within the chest, abdomen or pelvis. 2. Sigmoid diverticulosis. 3. Bilateral fat containing inguinal hernias.  CT HEAD WO CONTRAST CT MAXILLOFACIAL WO CONTRAST CT CERVICAL SPINE WO CONTRAST Radiologist impression: 1. No acute intracranial abnormality. 2. No facial or calvarial fracture. 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 3. No acute fracture of the cervical spine.  CXR No active disease, no rib fractures XR PELVIS no acute abnormalities  06/15/2021, MD 10/21/2021, 5:54 AM PGY-1, [place] [facility]  FPTS Intern pager: [phone], text pages welcome Secure chat group [facility] Teaching Service   AKI and lactic acidosis likely related to dehydration and ethanol lactic acidosis suspect will improve with IV fluids and should then be able to be discharged.  I was personally present and performed or re-performed the history, physical exam and medical decision making activities of this service and have verified that the service and findings are accurately documented in the student's note.  [facility], MD                  10/21/2021, 7:21 AM

## 2021-10-21 NOTE — Assessment & Plan Note (Addendum)
Lactate 4.4 on admission. Likely 2/2 AKI in the setting of alcohol intoxication/dehydration. -f/u VBG -hold metformin

## 2021-10-21 NOTE — ED Notes (Signed)
Pt is stating that he needs more medicine for his neck. Advises that the potassium is doing him no good. And he needs more meds. ED provider at bedside at this time

## 2021-10-21 NOTE — ED Notes (Signed)
Pt continues to be verbally abusive to all staff members.  He has pulled out 2 IV's stating "I wanted you all to know I could."  MD aware.  He also continues to get up out of bed and says he is going home

## 2021-10-21 NOTE — ED Notes (Signed)
Per ED provider make this pt a Level 2 trauma

## 2021-10-22 DIAGNOSIS — Z7901 Long term (current) use of anticoagulants: Secondary | ICD-10-CM

## 2021-10-22 DIAGNOSIS — N179 Acute kidney failure, unspecified: Secondary | ICD-10-CM | POA: Diagnosis not present

## 2021-10-22 DIAGNOSIS — F10931 Alcohol use, unspecified with withdrawal delirium: Secondary | ICD-10-CM

## 2021-10-22 DIAGNOSIS — S0990XA Unspecified injury of head, initial encounter: Secondary | ICD-10-CM | POA: Diagnosis not present

## 2021-10-22 DIAGNOSIS — S060X0A Concussion without loss of consciousness, initial encounter: Secondary | ICD-10-CM | POA: Diagnosis not present

## 2021-10-22 DIAGNOSIS — M6282 Rhabdomyolysis: Secondary | ICD-10-CM

## 2021-10-22 LAB — CBC
HCT: 37.9 % — ABNORMAL LOW (ref 39.0–52.0)
Hemoglobin: 12.7 g/dL — ABNORMAL LOW (ref 13.0–17.0)
MCH: 28.5 pg (ref 26.0–34.0)
MCHC: 33.5 g/dL (ref 30.0–36.0)
MCV: 85.2 fL (ref 80.0–100.0)
Platelets: 211 10*3/uL (ref 150–400)
RBC: 4.45 MIL/uL (ref 4.22–5.81)
RDW: 16.9 % — ABNORMAL HIGH (ref 11.5–15.5)
WBC: 13.4 10*3/uL — ABNORMAL HIGH (ref 4.0–10.5)
nRBC: 0 % (ref 0.0–0.2)

## 2021-10-22 LAB — BASIC METABOLIC PANEL
Anion gap: 8 (ref 5–15)
BUN: 14 mg/dL (ref 8–23)
CO2: 23 mmol/L (ref 22–32)
Calcium: 9.1 mg/dL (ref 8.9–10.3)
Chloride: 107 mmol/L (ref 98–111)
Creatinine, Ser: 1.02 mg/dL (ref 0.61–1.24)
GFR, Estimated: >60 mL/min (ref >60)
Glucose, Bld: 164 mg/dL — ABNORMAL HIGH (ref 70–99)
Potassium: 3.6 mmol/L (ref 3.5–5.1)
Sodium: 138 mmol/L (ref 135–145)

## 2021-10-22 LAB — URINE CULTURE: Culture: <10000 — AB

## 2021-10-22 LAB — GLUCOSE, CAPILLARY
Glucose-Capillary: 136 mg/dL — ABNORMAL HIGH (ref 70–99)
Glucose-Capillary: 138 mg/dL — ABNORMAL HIGH (ref 70–99)
Glucose-Capillary: 143 mg/dL — ABNORMAL HIGH (ref 70–99)

## 2021-10-22 LAB — CK: Total CK: 6292 U/L — ABNORMAL HIGH (ref 49–397)

## 2021-10-22 MED ORDER — ENOXAPARIN SODIUM 80 MG/0.8ML IJ SOSY
70.0000 mg | PREFILLED_SYRINGE | INTRAMUSCULAR | Status: DC
  Administered 2021-10-22 – 2021-10-26 (×5): 70 mg via SUBCUTANEOUS
  Filled 2021-10-22 (×5): qty 0.8

## 2021-10-22 MED ORDER — LORAZEPAM 1 MG PO TABS: 2.0000 mg | ORAL_TABLET | Freq: Three times a day (TID) | ORAL | Status: DC

## 2021-10-22 MED ORDER — LORAZEPAM 1 MG PO TABS: 2.0000 mg | ORAL_TABLET | Freq: Four times a day (QID) | ORAL | Status: DC

## 2021-10-22 MED ORDER — LORAZEPAM 2 MG/ML IJ SOLN
2.0000 mg | Freq: Once | INTRAMUSCULAR | Status: AC
  Administered 2021-10-22: 2 mg via INTRAVENOUS
  Filled 2021-10-22: qty 1

## 2021-10-22 MED ORDER — LORAZEPAM 2 MG/ML IJ SOLN
2.0000 mg | Freq: Three times a day (TID) | INTRAMUSCULAR | Status: DC
Start: 2021-10-22 — End: 2021-10-25
  Administered 2021-10-22 – 2021-10-25 (×8): 2 mg via INTRAVENOUS
  Filled 2021-10-22 (×8): qty 1

## 2021-10-22 MED ORDER — SODIUM CHLORIDE 0.9 % IV SOLN: INTRAVENOUS | Status: AC

## 2021-10-22 MED ORDER — LORAZEPAM 1 MG PO TABS
2.0000 mg | ORAL_TABLET | Freq: Three times a day (TID) | ORAL | Status: DC
  Administered 2021-10-23: 2 mg via ORAL
  Filled 2021-10-22: qty 2

## 2021-10-22 NOTE — Progress Notes (Addendum)
Interim Progress Note  In to re-evaluate patient with Ernest White. Last two CIWA scores 20 and 19. Patient has received a total of 15 mg of Ativan since midnight. D/w RN that for any score >20 rapid response should be paged, per CIWA protocol.  In the room patient was laying in bed. Sitter is still at the bedside. He still has b/l wrist restraints, mittens, and soft belt in place. His words were hard to make out. He was not as loud as previous encounter. RN notes that prior to last Ativan dosage (4 mg at 1614) he was banging his head on the side of the bed rail.   Ernest White was not oriented to place, time, or situation. He was able to tell me his name but other than that it was difficult for me to make out what he was trying to say.   He is still acutely withdrawing. He is on scheduled Ativan 2 mg q8h with PRN ativan per CIWA protocol. Will need to continue to watch CIWA scores closely. If he continues to need high doses of Ativan or scores a CIWA >20, rapid response will need to be called and we will need to re-consult CCM for evaluation.

## 2021-10-22 NOTE — Assessment & Plan Note (Addendum)
Likely traumatic after fall. CK 6292 -> 3967. Renal function is improving with PO intake; AKI has resolved. -Encourage PO when able

## 2021-10-22 NOTE — Assessment & Plan Note (Addendum)
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  Per office note from July 2019 he was supposed to be on it for 6 months only. Given his significant EtOH use and his recent fall from 7 ft, he seems to be high-risk for chronic anticoagulation. No hx of AF that I am able to find.  -D/c'd xarelto -Started Lovenox for VTE ppx

## 2021-10-22 NOTE — Progress Notes (Signed)
Patient is more awake now but still disoriented X4. Still have some agitation and calm at times. Trying to removed his mittens but unable to do so. Offered water and pudding. Was able to verbalize patient concerns such as headache and being sweaty. Ativan given based on pt's CIWA. Will continue to monitor.

## 2021-10-22 NOTE — Progress Notes (Signed)
Patient continues to scream and pull at his restraints.  Ativan has been given and doesn't seem to have any affect on patient.  Will continue to monitor.

## 2021-10-22 NOTE — Progress Notes (Addendum)
PHARMACIST - PHYSICIAN COMMUNICATION  CONCERNING:  Enoxaparin (Lovenox) for DVT Prophylaxis    RECOMMENDATION: Patient was prescribed enoxaprin 40mg  q24 hours for VTE prophylaxis.   Filed Weights   10/21/21 0052  Weight: (!) 138.8 kg (306 lb)    Body mass index is 42.68 kg/m.  Estimated Creatinine Clearance: 100.1 mL/min (by C-G formula based on SCr of 1.02 mg/dL).   Based on [facility] policy patient is candidate for enoxaparin 0.5mg /kg TBW SQ every 24 hours based on BMI being >30.   DESCRIPTION: Pharmacy has adjusted enoxaparin dose per [facility] policy.  Patient is now receiving enoxaparin 70 mg every 24 hours    [facility], PharmD Clinical Pharmacist  10/22/2021 9:53 AM

## 2021-10-22 NOTE — Progress Notes (Signed)
Interim Progress Note  In to check on patient. Sitter and NT at the bedside. Sitter reports that patients only moment of calmness was when he was trying to bite his mittens off. Sitter repaged IV team to get IV placement for fluids for his rhabdo and given poor PO intake due to his restraints and altered mentation.   Patient is yelling out profanities and trying to get out of restraints. Currently has soft b/l wrist restraints, mittens and belt on.   Ativan 2 mg scheduled to be given every 8 hours along with PRN Ativan for CIWA protocol. First dose of scheduled ativan is at [facility].   Will reassess later this afternoon.

## 2021-10-22 NOTE — Progress Notes (Signed)
FMTS Interim Progress Note  S: evaluated patient with Dr. Claudean Severance.  RN at bedside.  Wrist restraints, mittens, and self belt are still in place.  Currently patient is sleeping comfortably.  RN notes that he has intermittent periods where he yells or shakes his hands, other times he is calm.  O: BP (!) 168/102 (BP Location: Left Arm)   Pulse (!) 110   Temp 99.6 F (37.6 C) (Oral)   Resp 20   Ht 5\' 11"  (1.803 m)   Wt (!) 138.8 kg   SpO2 100%   BMI 42.68 kg/m   General: Sleeping, snoring loudly, NAD   A/P: Alcohol withdrawal appears improved from earlier today, currently calm.  Continue scheduled lorazepam 2 mg every 8 hours with as needed lorazepam per CIWA protocol for alcohol withdrawal.  RN aware to call rapid response for CIWA greater than 20.  , MD 10/22/2021, 8:35 PM PGY-3, [facility] Family Medicine Service pager [phone]

## 2021-10-22 NOTE — Progress Notes (Signed)
Patient taking his fist and hitting himself in the head over and over again with mitts on.  Patient kicking the side of the bed and throwing his legs over the railing.  Provider has been notified and soft wrist restraints have been placed.

## 2021-10-22 NOTE — Progress Notes (Addendum)
Daily Progress Note Intern Pager: [phone]  Patient name: Ernest White Medical record number: [id number] Date of birth: [birth year] Age: 66 y.o. Gender: male  Primary Care Provider: Erick Alley, DO Consultants: None Code Status: FULL  Pt Overview and Major Events to Date:  8/12: Admitted; PCCM consulted due to continued agitation but felt patient appropriate for floor   Assessment and Plan: Ernest White is a 66 y.o. male who presented to the ED after a 42ft fall likely 2/2 alcohol intoxication. He was subsequently admitted for AKI, thought to be pre-renal from alcohol-related dehydration, this is now resolved. He has significant drinking history and is altered and agitated- now thought to be in acute EtOH withdrawal.   * Alcohol withdrawal (HCC) Unclear when last drink was but ethanol level was 108 on admission 8/11 at 2350. CIWA overnight ranging 4-10, most recently 6. CIWA protocol with ativan. Has received 5 mg total of Ativan today thus far. He is still agitated and altered. He has mittens placed b/l. RN reports no sitter is available and patient was pulling tele off and attempting to get out of bed so will place additional order for soft belt.  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  Per office note from July 2019 he was supposed to be on it for 6 months only. Given his significant EtOH use and his recent fall from 7 ft, he seems to be high-risk for chronic anticoagulation. No hx of AF that I am able to find.  -D/c xarelto -Start Lovenox for VTE ppx  Rhabdomyolysis Likely traumatic after fall. CK 6292 this AM. Renal function is improving with PO intake.  -Encourage PO  -Repeat CK this afternoon   Nondisplaced fracture  of proximal phalanx of toe After fall. Pain seems to be well controlled.  -PRN Tylenol for pain control  Tobacco use 1 pack per day since age 24. Still smoking. -Nicotine patch  Diabetes (HCC) Holding metformin due to elevated lactate on admission. On Jardiance and glipizide at home. Hgb A1c 7.4. -SSI, hold home meds   AKI: Resolved, Cr back at baseline.  Elevated lactic acid level: Resolved.  Hypokalemia: Resolved- potassium 3.6 this AM   FEN/GI: Heart Healthy  PPx: Lovenox Dispo:Pending PT recommendations  in 2-3 days. Barriers include EtOH withdrawal, AMS, agitation.   Subjective:  Patient slightly agitated in bed, continuously stating "get these things off me". He denies any pain.   Objective: Temp:  [97 F (36.1 C)-99.2 F (37.3 C)] 98.1 F (36.7 C) (08/13 0718) Pulse Rate:  [98-135] 118 (08/13 0718) Resp:  [16-26] 18 (08/13 0434) BP: (94-166)/(62-122) 155/105 (08/13 0718) SpO2:  [86 %-99 %] 95 % (08/13 0434) Physical Exam: General: Awake, agitated, mittens placed b/l, alert but not oriented to place, or situation Cardiovascular: Sinus rhythm on tele  Respiratory: Normal respiratory effort on RA Abdomen: Obese  Extremities: No edema  Laboratory: Most recent CBC Lab Results  Component Value Date   WBC 13.4 (H) 10/22/2021   HGB 12.7 (L) 10/22/2021   HCT 37.9 (L) 10/22/2021   MCV 85.2 10/22/2021   PLT 211 10/22/2021   Most recent BMP    Latest Ref Rng & Units 10/22/2021    2:05 AM  BMP  Glucose 70 - 99  mg/dL 834   BUN 8 - 23 mg/dL 14   Creatinine 6.21 - 1.24 mg/dL 9.47   Sodium 125 - 271 mmol/L 138   Potassium 3.5 - 5.1 mmol/L 3.6   Chloride 98 - 111 mmol/L 107   CO2 22 - 32 mmol/L 23   Calcium 8.9 - 10.3 mg/dL 9.1    Other pertinent labs: CK 6292   None new   Sabino Dick, DO 10/22/2021, 12:13 PM  PGY-3, [facility] FPTS Intern pager: [phone], text pages welcome Secure chat group [facility] Teaching  Service

## 2021-10-22 NOTE — Progress Notes (Signed)
IV Team consulted for difficult stick.  Upon arrival, 3 staff members at bedside getting pt cleaned up.  Asked RN to place new consult when pt is cleaned up and ready for assessment; RN acknowledged.

## 2021-10-23 DIAGNOSIS — F10931 Alcohol use, unspecified with withdrawal delirium: Secondary | ICD-10-CM | POA: Diagnosis not present

## 2021-10-23 DIAGNOSIS — S060X0A Concussion without loss of consciousness, initial encounter: Secondary | ICD-10-CM | POA: Diagnosis not present

## 2021-10-23 DIAGNOSIS — S0990XA Unspecified injury of head, initial encounter: Secondary | ICD-10-CM | POA: Diagnosis not present

## 2021-10-23 DIAGNOSIS — F10929 Alcohol use, unspecified with intoxication, unspecified: Secondary | ICD-10-CM | POA: Diagnosis not present

## 2021-10-23 LAB — GLUCOSE, CAPILLARY
Glucose-Capillary: 138 mg/dL — ABNORMAL HIGH (ref 70–99)
Glucose-Capillary: 130 mg/dL — ABNORMAL HIGH (ref 70–99)
Glucose-Capillary: 158 mg/dL — ABNORMAL HIGH (ref 70–99)
Glucose-Capillary: 112 mg/dL — ABNORMAL HIGH (ref 70–99)

## 2021-10-23 LAB — CK: Total CK: 3967 U/L — ABNORMAL HIGH (ref 49–397)

## 2021-10-23 MED ORDER — SODIUM CHLORIDE 0.9 % IV SOLN
INTRAVENOUS | Status: AC
Start: 2021-10-23 — End: 2021-10-23

## 2021-10-23 NOTE — Progress Notes (Addendum)
Daily Progress Note Intern Pager: [phone]  Patient name: Ernest White Medical record number: [id number] Date of birth: [birth year] Age: 66 y.o. Gender: male  Primary Care Provider: Erick Alley, DO Consultants: none Code Status: FULL  Pt Overview and Major Events to Date:  8/12: Admitted; PCCM consulted due to continued agitation but felt patient appropriate for floor   Assessment and Plan:  Ernest White is a 66 y.o. male p/w a fall from 21ft likely 2/2 alcohol intoxication, initially admitted with AKI that has now resolved. Currently being managed for acute EtOH withdrawal and rhabdomyolysis. CIWA scores improving but pt still having episodes of agitation requiring soft restraints.  * Alcohol withdrawal (HCC) Unclear when last drink was but ethanol level was 108 on admission 8/11 at 2350. CIWA overnight ranging 6-14, most recently 6. CIWA protocol with ativan. Received 7mg  Atival total overnight (3mg  PRN, 4mg  sch). He is still agitated and altered. 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  Per office note from July 2019 he was supposed to be on it for 6 months only. Given his significant EtOH use and his recent fall from 7 ft, he seems to be high-risk for chronic anticoagulation. No hx of AF that I am able to find.  -D/c'd xarelto -Started Lovenox for VTE ppx  Rhabdomyolysis Likely traumatic after fall. CK 6292 yesterday. Renal function is improving with PO intake; AKI has resolved. -NS IVF 150cc/hr -Encourage PO when able -f/u Repeat CK -AM BMP  Nondisplaced fracture of proximal phalanx of toe After fall. Pain seems to be  well controlled.  -PRN Tylenol for pain control  Tobacco use 1 pack per day since age 55. Still smoking. -Nicotine patch  Diabetes (HCC) Holding metformin due to elevated lactate on admission. On Jardiance and glipizide at home. Hgb A1c 7.4. -SSI, hold home meds        FEN/GI: Heart healthy diet, IV NS 192mL/hr PPx: Lovenox Dispo:Pending PT recommendations  in 2-3 days. Barriers include EtOH withdrawal, agitation, AMS.   Subjective:  Pt still with slurred speech and not formulating full sentences. States he wants to get out of restraints.   Objective: Temp:  [98.2 F (36.8 C)-99.7 F (37.6 C)] 98.8 F (37.1 C) (08/14 1000) Pulse Rate:  [99-110] 106 (08/14 1000) Resp:  [18-20] 18 (08/14 1000) BP: (117-168)/(73-102) 117/73 (08/14 1000) SpO2:  [95 %-100 %] 100 % (08/14 1000) Physical Exam: General: In bed, mittens b/l with soft belt, talks for a few seconds then falls back asleep Cardiovascular: RRR Respiratory: normal WOB, snoring Abdomen: Soft, non-distended Extremities: Arms in restraints, spontaneous movement  Laboratory: Most recent CBC Lab Results  Component Value Date   WBC 13.4 (H) 10/22/2021   HGB 12.7 (L) 10/22/2021   HCT 37.9 (L) 10/22/2021   MCV 85.2 10/22/2021   PLT 211 10/22/2021   Most recent BMP    Latest Ref Rng & Units 10/22/2021    2:05 AM  BMP  Glucose 70 - 99 mg/dL 10/24/2021   BUN 8 - 23 mg/dL 14   Creatinine 10/24/2021 - 1.24 mg/dL 10/24/2021   Sodium 761 - 9.50 mmol/L 138   Potassium 3.5 - 5.1 mmol/L 3.6  Chloride 98 - 111 mmol/L 107   CO2 22 - 32 mmol/L 23   Calcium 8.9 - 10.3 mg/dL 9.1     Other pertinent labs   CK  6292 (8/13)    Vonna Drafts, MD 10/23/2021, 12:56 PM  PGY-1, [facility] FPTS Intern pager: [phone], text pages welcome Secure chat group [facility] Teaching Service

## 2021-10-23 NOTE — Progress Notes (Signed)
In to check on patient with Dr. Claudean Severance. Corporate treasurer at bedside.  At this time, patient was demanding to have his mittens removed but then shortly afterwards fell back asleep.  Per RN, he was able to state his name earlier but otherwise has still been disoriented.  Seems to be improving overall, sitter and restraints still appropriate.  Continue current plan.

## 2021-10-23 NOTE — Hospital Course (Addendum)
Ernest White is a 66 y.o.male with a history of alcohol use, T2DM, HTN who was admitted to the [facility] Medicine Teaching Service at [facility] after a fall from 78ft, found to have an AKI which resolved then was in acute EtOH withdrawal. Also found to have rhabdo. His hospital course is detailed below:  Alcohol Withdrawal Ethanol level 108 on admission and was found in acute intoxication. Was acutely agitated and required restraints for multiple days. Continued with alcohol withdrawal protocol, attempted Librium but found more success with Ativan, and Ativan was given frequently to control withdrawal symptoms. CIWAs gradually decreased over time. Due to large amount of Ativan given during his stay, pt was started on Librium taper prior to discharge and was sent home with taper for 2 additional days.  Acute Kidney Injury Cr elevated to 1.5 on admission, improved to 1.02 after IV fluids.  Rhabdomyolysis CK elevated to 6,292, repeat was 3967. Received IVF.  Level 2 Trauma after fall from 73ft Nondisplaced fracture of proximal phalanx of toe noted on Right foot x-ray after fall. Pain well controlled during stay. Avoided opioids. Sent home with Tylenol and hard shoe.  Imaging otherwise negative for acute traumatic injury.  Other chronic conditions: Diabetes - given SSI in the hospital (metformin held due to elevated lactate on admission) - metformin switched to 1000mg  daily extended release due to GI upset. Home Jardiance continued at discharge. Dc'd glipizide.  Tobacco use - nicotine patch given. Sent home w/ nicotine patch.  Chronic anticoagulation - Was on Xarelto for b/l PE in 08/2017; however this was only supposed to be for 6 months. Xarelto was dc'd given significant EtOH use and frequent falls.    PCP Follow-up Recommendations: Chronic Xarelto dc'd during his stay, thought to be high risk for chronic anticoagulation given EtOH use and frequent falls, no hx AF seen. May re-evaluate need for  anticoagulation as needed F/u diabetes regimen - dc'd glipizide during his stay. Also changed metformin to extended release due to GI upset. Ensure pt is able to self ambulate and able to perform ADLs, home health PT TSH slightly elevated, consider repeat      Imaging Studies Performed:   CT CHEST ABD PELVIS Radiologist impression: 1. No acute post-traumatic abnormalities within the chest, abdomen or pelvis. 2. Sigmoid diverticulosis. 3. Bilateral fat containing inguinal hernias.   CT HEAD WO CONTRAST CT MAXILLOFACIAL WO CONTRAST CT CERVICAL SPINE WO CONTRAST Radiologist impression: 1. No acute intracranial abnormality. 2. No facial or calvarial fracture. 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 3. No acute fracture of the cervical spine.  X-RAY FOOT 2 VIEWS RIGHT Radiologist impression: Nondisplaced fracture through the distal aspect of the proximal third phalanx. No other acute abnormalities.   CXR No active disease, no rib fractures XR PELVIS no acute abnormalities

## 2021-10-23 NOTE — Progress Notes (Signed)
Patient has some intermittent agitation where he is calling/yelling someone's name, cursing, moving his both hands asking to removed his mittens otherwise there are periods that patient is calm and was able verbalize that he is thirsty, hungry or in pain. Most of the times patient eyes are closed but opens briefly when talking to him. Food and drinks were offered. Ativan given based on pt's CIWA. Will continue to monitor.

## 2021-10-23 NOTE — Progress Notes (Addendum)
FMTS Brief Progress Note  S: Seen at bedside with Dr. Claudean Severance.  Patient was awake speaking with his eyes closed still mumbling with somewhat slurred speech.  Lap belt still in place, wrist restraints have been loosened.  He was demanding that we give him his close so that he can go home.  Strongly advised to stay in the hospital.  He is able to state his name and the location.   O: BP (!) 158/96 (BP Location: Left Arm)   Pulse (!) 105   Temp 98.9 F (37.2 C) (Axillary)   Resp 18   Ht 5\' 11"  (1.803 m)   Wt (!) 138.8 kg   SpO2 95%   BMI 42.68 kg/m     A/P: Alcohol withdrawal overall improving more alert but still somewhat agitated.  Continue current plan, can attempt to transition off restraints if he remains consistently calm. - Orders reviewed. Labs for AM ordered, which was adjusted as needed.  - If condition changes, plan includes call rapid response for CIWA >20.   Littie Deeds, MD 10/23/2021, 9:13 PM PGY-3, [facility] Night Resident  Please page [phone] with questions.

## 2021-10-23 NOTE — Progress Notes (Signed)
FMTS Interim Progress Note  S: In to see pt after he requested to speak with MD. Pt continues to be sleepy and mumbling with slurred speech while I was in the room. However, RN reports he was just recently more awake and understandable. Continues to wax and wane. Advised pt that it is safest for him to be in the hospital right now. States he does not like the restraints. Promptly fell back asleep after a minute or so.  O: BP 119/85 (BP Location: Left Arm)   Pulse (!) 106   Temp 98.7 F (37.1 C) (Oral)   Resp 18   Ht 5\' 11"  (1.803 m)   Wt (!) 138.8 kg   SpO2 98%   BMI 42.68 kg/m     A/P: Continue current plan. May loosen restraints for patient comfort and see if we can transition off completely, but re-tighten if he begins to pull at his IV or becomes violent.  , MD 10/23/2021, 5:31 PM PGY-1, [facility] Family Medicine Service pager [phone]

## 2021-10-23 NOTE — Progress Notes (Signed)
In to check on patient with Dr. Claudean Severance.  Patient sleeping at this time.  Sitter reports that he has been waking up agitated about every 15 minutes shaking his hands otherwise has been calm.  Most recent CIWA scores 8, 11, 14.  He received as needed doses of lorazepam 2 mg about 30 minutes prior.  Continue to monitor, sitter order renewed.

## 2021-10-23 NOTE — Progress Notes (Signed)
  Transition of Care [facility]) Screening Note   Patient Details  Name: Ernest White Date of Birth: 1955-11-22   Transition of Care [facility]) CM/SW Contact:    Kermit Balo, RN Phone Number: 10/23/2021, 2:45 PM   Pt is from home alone. Currnetly confused and in restraints.  Transition of Care Department [facility]) has reviewed patient. We will continue to monitor patient advancement through interdisciplinary progression rounds. If new patient transition needs arise, please place a TOC consult.

## 2021-10-24 DIAGNOSIS — F10929 Alcohol use, unspecified with intoxication, unspecified: Secondary | ICD-10-CM | POA: Diagnosis not present

## 2021-10-24 DIAGNOSIS — F10931 Alcohol use, unspecified with withdrawal delirium: Secondary | ICD-10-CM | POA: Diagnosis not present

## 2021-10-24 LAB — BASIC METABOLIC PANEL
Anion gap: 9 (ref 5–15)
BUN: 16 mg/dL (ref 8–23)
CO2: 20 mmol/L — ABNORMAL LOW (ref 22–32)
Calcium: 8.7 mg/dL — ABNORMAL LOW (ref 8.9–10.3)
Chloride: 110 mmol/L (ref 98–111)
Creatinine, Ser: 1.02 mg/dL (ref 0.61–1.24)
GFR, Estimated: >60 mL/min (ref >60)
Glucose, Bld: 114 mg/dL — ABNORMAL HIGH (ref 70–99)
Potassium: 3.3 mmol/L — ABNORMAL LOW (ref 3.5–5.1)
Sodium: 139 mmol/L (ref 135–145)

## 2021-10-24 LAB — GLUCOSE, CAPILLARY
Glucose-Capillary: 118 mg/dL — ABNORMAL HIGH (ref 70–99)
Glucose-Capillary: 146 mg/dL — ABNORMAL HIGH (ref 70–99)
Glucose-Capillary: 137 mg/dL — ABNORMAL HIGH (ref 70–99)
Glucose-Capillary: 142 mg/dL — ABNORMAL HIGH (ref 70–99)

## 2021-10-24 MED ORDER — LORAZEPAM 1 MG PO TABS
1.0000 mg | ORAL_TABLET | ORAL | Status: DC | PRN
  Administered 2021-10-25: 2 mg via ORAL
  Filled 2021-10-24: qty 2

## 2021-10-24 MED ORDER — LORAZEPAM 2 MG/ML IJ SOLN
1.0000 mg | INTRAMUSCULAR | Status: DC | PRN
  Administered 2021-10-24 – 2021-10-25 (×3): 2 mg via INTRAVENOUS
  Administered 2021-10-25: 3 mg via INTRAVENOUS
  Filled 2021-10-24: qty 2
  Filled 2021-10-24 (×2): qty 1
  Filled 2021-10-24: qty 2

## 2021-10-24 MED ORDER — POTASSIUM CHLORIDE 20 MEQ PO PACK
20.0000 meq | PACK | Freq: Two times a day (BID) | ORAL | Status: AC
  Administered 2021-10-24 (×2): 20 meq via ORAL
  Filled 2021-10-24 (×2): qty 1

## 2021-10-24 MED ORDER — GLUCERNA SHAKE PO LIQD
237.0000 mL | Freq: Three times a day (TID) | ORAL | Status: DC
  Administered 2021-10-24 – 2021-10-27 (×6): 237 mL via ORAL

## 2021-10-24 NOTE — TOC CAGE-AID Note (Signed)
Transition of Care [facility]) - CAGE-AID Screening   Patient Details  Name: Ernest White MRN: [id number] Date of Birth: [birth year]  Transition of Care [facility]) CM/SW Contact:    Carley Hammed, LCSWA Phone Number: 10/24/2021, 8:44 AM   Clinical Narrative: Pt unable to participate in CAGE- AID assessment due to disorientation.    CAGE-AID Screening: Substance Abuse Screening unable to be completed due to: : Patient unable to participate             Substance Abuse Education Offered: No

## 2021-10-24 NOTE — Progress Notes (Signed)
Daily Progress Note Intern Pager: [phone]  Patient name: Ernest White Medical record number: [id number] Date of birth: 11-09-1955 Age: 66 y.o. Gender: male  Primary Care Provider: Erick Alley, DO Consultants: n/a Code Status: FULL  Pt Overview and Major Events to Date:  8/12: Admitted; PCCM consulted due to continued agitation but felt patient appropriate for floor   Assessment and Plan:  [name] is a 66 y.o. male p/w a fall from 47ft likely 2/2 alcohol intoxication, initially admitted with AKI that has now resolved. Currently being managed for acute EtOH withdrawal and rhabdomyolysis. CIWA scores remain >10 overnight. Received 2mg  ativan since midnight, no PRNs. Pt is a little more alert and reasonable this morning. Attempted trial off restraints but pt removed IV shortly after.   * Alcohol withdrawal (HCC) Unclear when last drink was but ethanol level was 108 on admission 8/11 at 2350. CIWA overnight ranging 13-16, most recently 13. CIWA protocol with ativan. Received 2mg  Atival total overnight (sch). He is still agitated and altered but seems improved from yesterday. 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  Per office note from July 2019 he was supposed to be on it for 6 months only. Given his significant EtOH use and his recent fall from 7 ft, he seems to be high-risk for chronic anticoagulation. No hx of AF that I am able to find.  -D/c'd xarelto -Started Lovenox for VTE ppx  Rhabdomyolysis Likely traumatic after fall. CK 6292 -> 3967. Renal function is improving with PO intake; AKI has resolved. -Encourage PO when able -AM BMP  Nondisplaced fracture of proximal phalanx of toe After fall. Pain seems to be well  controlled.  -PRN Tylenol for pain control  Tobacco use 1 pack per day since age 14. Still smoking. -Nicotine patch  Diabetes (HCC) Holding metformin due to elevated lactate on admission. On Jardiance and glipizide at home. Hgb A1c 7.4. -SSI, hold home meds      FEN/GI: heart healthy diet PPx: Lovenox Dispo:Pending PT recommendations  pending clinical improvement . Barriers include alcohol withdrawal.   Subjective:  Pt eating breakfast this morning. States he wishes to go home soon and wants restraints removed. Speech was clear and understandable at first but then dozed off after a couple of minutes.  Objective: Temp:  [98 F (36.7 C)-98.9 F (37.2 C)] 98.3 F (36.8 C) (08/15 0700) Pulse Rate:  [104-109] 104 (08/15 0700) Resp:  [18-19] 18 (08/15 0700) BP: (119-178)/(85-112) 148/110 (08/15 0700) SpO2:  [95 %-98 %] 98 % (08/15 0700) Physical Exam: General: Awake but sleepy, makes eye contact when awake, speaks in full sentences then mumbles when sleepy Neuro: Oriented to place and self, unable to assess whether oriented to time due to sleepiness  Laboratory: Most recent CBC Lab Results  Component Value Date   WBC 13.4 (H) 10/22/2021   HGB 12.7 (L) 10/22/2021   HCT 37.9 (L) 10/22/2021   MCV 85.2 10/22/2021   PLT 211 10/22/2021   Most recent BMP    Latest Ref Rng & Units 10/24/2021    4:21 AM  BMP  Glucose 70 - 99 mg/dL 10/24/2021   BUN 8 - 23 mg/dL 16   Creatinine 10/26/2021 - 1.24 mg/dL 324   Sodium 4.01 - 0.27  mmol/L 139   Potassium 3.5 - 5.1 mmol/L 3.3   Chloride 98 - 111 mmol/L 110   CO2 22 - 32 mmol/L 20   Calcium 8.9 - 10.3 mg/dL 8.7      Vonna Drafts, MD 10/24/2021, 11:07 AM  PGY-1, [facility] FPTS Intern pager: [phone], text pages welcome Secure chat group [facility] Teaching Service

## 2021-10-24 NOTE — Care Management Important Message (Signed)
Important Message  Patient Details  Name: Ernest White MRN: [id number] Date of Birth: Oct 07, 1955   Medicare Important Message Given:  Yes     Demareon Coldwell Stefan Church 10/24/2021, 2:37 PM

## 2021-10-25 DIAGNOSIS — I10 Essential (primary) hypertension: Secondary | ICD-10-CM

## 2021-10-25 DIAGNOSIS — E876 Hypokalemia: Secondary | ICD-10-CM

## 2021-10-25 DIAGNOSIS — F10931 Alcohol use, unspecified with withdrawal delirium: Secondary | ICD-10-CM | POA: Diagnosis not present

## 2021-10-25 DIAGNOSIS — E0869 Diabetes mellitus due to underlying condition with other specified complication: Secondary | ICD-10-CM | POA: Diagnosis not present

## 2021-10-25 LAB — GLUCOSE, CAPILLARY
Glucose-Capillary: 147 mg/dL — ABNORMAL HIGH (ref 70–99)
Glucose-Capillary: 156 mg/dL — ABNORMAL HIGH (ref 70–99)
Glucose-Capillary: 157 mg/dL — ABNORMAL HIGH (ref 70–99)
Glucose-Capillary: 178 mg/dL — ABNORMAL HIGH (ref 70–99)
Glucose-Capillary: 181 mg/dL — ABNORMAL HIGH (ref 70–99)

## 2021-10-25 LAB — BASIC METABOLIC PANEL
Anion gap: 8 (ref 5–15)
BUN: 12 mg/dL (ref 8–23)
CO2: 23 mmol/L (ref 22–32)
Calcium: 9.3 mg/dL (ref 8.9–10.3)
Chloride: 107 mmol/L (ref 98–111)
Creatinine, Ser: 0.86 mg/dL (ref 0.61–1.24)
GFR, Estimated: >60 mL/min (ref >60)
Glucose, Bld: 139 mg/dL — ABNORMAL HIGH (ref 70–99)
Potassium: 3.6 mmol/L (ref 3.5–5.1)
Sodium: 138 mmol/L (ref 135–145)

## 2021-10-25 MED ORDER — LORAZEPAM 2 MG/ML IJ SOLN: 1.0000 mg | INTRAMUSCULAR | Status: DC | PRN

## 2021-10-25 MED ORDER — LORAZEPAM 1 MG PO TABS
1.0000 mg | ORAL_TABLET | Freq: Three times a day (TID) | ORAL | Status: DC
Start: 2021-10-25 — End: 2021-10-26
  Administered 2021-10-25 (×2): 1 mg via ORAL
  Filled 2021-10-25 (×2): qty 1

## 2021-10-25 MED ORDER — LORAZEPAM 2 MG/ML IJ SOLN: 1.0000 mg | Freq: Three times a day (TID) | INTRAMUSCULAR | Status: DC

## 2021-10-25 MED ORDER — LISINOPRIL 20 MG PO TABS
20.0000 mg | ORAL_TABLET | Freq: Every day | ORAL | Status: DC
  Administered 2021-10-25 – 2021-10-26 (×2): 20 mg via ORAL
  Filled 2021-10-25 (×2): qty 1

## 2021-10-25 MED ORDER — SODIUM CHLORIDE 0.9 % IV SOLN: INTRAVENOUS | Status: DC

## 2021-10-25 MED ORDER — LORAZEPAM 1 MG PO TABS: 1.0000 mg | ORAL_TABLET | ORAL | Status: DC | PRN

## 2021-10-25 NOTE — Consult Note (Signed)
Brief Psychiatry Consult Note   Date of service: October 25, 2021 Patient Name: Ernest White DOB: February 25, 1956 MRN: [id number] Reason for consult: "Elevated CIWA score" Requesting Provider: Nestor Ramp, MD  Patient seen at bedside with Psychiatry team. Patient was somnolent and difficult to rouse. He woke briefly to state that he would like to get up and stretch his arms and legs, express frustration with his being in restraints, and deny symptoms of alcohol withdrawal. He is disoriented, stating that the day is the "22", the month is "almost February". He is oriented to year and place. He then began snoring and we were unable to rouse to continue the interview.   Psychiatry team will round on him tomorrow to reevaluate.  Signed: Mariah Milling, Medical Student  [facility] 10/25/2021, 11:20 AM

## 2021-10-25 NOTE — Plan of Care (Signed)
  Problem: Education: Goal: Ability to describe self-care measures that may prevent or decrease complications (Diabetes Survival Skills Education) will improve Outcome: Progressing Goal: Individualized Educational Video(s) Outcome: Progressing   Problem: Coping: Goal: Ability to adjust to condition or change in health will improve Outcome: Progressing   Problem: Fluid Volume: Goal: Ability to maintain a balanced intake and output will improve Outcome: Progressing   Problem: Health Behavior/Discharge Planning: Goal: Ability to identify and utilize available resources and services will improve Outcome: Progressing Goal: Ability to manage health-related needs will improve Outcome: Progressing   Problem: Metabolic: Goal: Ability to maintain appropriate glucose levels will improve Outcome: Progressing   Problem: Nutritional: Goal: Maintenance of adequate nutrition will improve Outcome: Progressing Goal: Progress toward achieving an optimal weight will improve Outcome: Progressing   Problem: Skin Integrity: Goal: Risk for impaired skin integrity will decrease Outcome: Progressing   Problem: Tissue Perfusion: Goal: Adequacy of tissue perfusion will improve Outcome: Progressing   Problem: Education: Goal: Knowledge of General Education information will improve Description: Including pain rating scale, medication(s)/side effects and non-pharmacologic comfort measures Outcome: Progressing   Problem: Health Behavior/Discharge Planning: Goal: Ability to manage health-related needs will improve Outcome: Progressing   Problem: Clinical Measurements: Goal: Ability to maintain clinical measurements within normal limits will improve Outcome: Progressing Goal: Will remain free from infection Outcome: Progressing Goal: Diagnostic test results will improve Outcome: Progressing Goal: Respiratory complications will improve Outcome: Progressing Goal: Cardiovascular complication will  be avoided Outcome: Progressing   Problem: Activity: Goal: Risk for activity intolerance will decrease Outcome: Progressing   Problem: Nutrition: Goal: Adequate nutrition will be maintained Outcome: Progressing   Problem: Coping: Goal: Level of anxiety will decrease Outcome: Progressing   Problem: Elimination: Goal: Will not experience complications related to bowel motility Outcome: Progressing Goal: Will not experience complications related to urinary retention Outcome: Progressing   Problem: Pain Managment: Goal: General experience of comfort will improve Outcome: Progressing   Problem: Safety: Goal: Ability to remain free from injury will improve Outcome: Progressing   Problem: Skin Integrity: Goal: Risk for impaired skin integrity will decrease Outcome: Progressing   Problem: Safety: Goal: Non-violent Restraint(s) Outcome: Progressing   

## 2021-10-25 NOTE — Progress Notes (Signed)
FMTS Brief Progress Note  S: Seen at bedside with Dr. Anner Crete.  Patient is alert and oriented to person/ place/event, reoriented to time after conversation.  Patient was not agitated, not in restraints.  His speech was fluent and coherent.  He expressed a desire to go home due to " needing to pay bills."  We discussed that we would prefer not to let him go home over the night and desire to continue to wean off Ativan.  He also expressed irritation at IV site on forearm, however has made no further attempts to remove it.  O: BP (!) 146/97 (BP Location: Right Arm)   Pulse 67   Temp 98.1 F (36.7 C) (Oral)   Resp 20   Ht 5\' 11"  (1.803 m)   Wt (!) 138.8 kg   SpO2 94%   BMI 42.68 kg/m   Neuro: Alert and oriented to person place and event.  Reoriented to time after conversation Psych: Not agitated not aggressive.  Annoyed affect.  A/P: It is my opinion that patient has significantly improved both cognitively and behaviorally after reducing his scheduled Ativan to 1 mg. - Ensure patient is able to ambulate appropriately in a.m. - Reevaluate capacity to be discharged tomorrow in a.m.  - Orders reviewed. Labs for AM ordered, which was adjusted as needed.  - If condition changes, plan includes as needed Ativan and page night team if needed.   , MD 10/25/2021, 9:11 PM PGY-1,  Family Medicine Night Resident  Please page [phone] with questions.

## 2021-10-25 NOTE — Progress Notes (Addendum)
Daily Progress Note Intern Pager: [phone]  Patient name: Ernest White Medical record number: [id number] Date of birth: March 26, 1955 Age: 66 y.o. Gender: male  Primary Care Provider: Erick Alley, DO Consultants: psych Code Status: FULL  Pt Overview and Major Events to Date:  8/12: Admitted; PCCM consulted due to continued agitation but felt patient appropriate for floor   Assessment and Plan:  Ernest White is a 66 y.o. male p/w fall from 40ft likely 2/2 alcohol intoxication, initially admitted with AKI that has now resolved. Currently being managed for acute EtOH withdrawal. Has improved in his cognition since before and his agitation seems more related to the fact that he was restrained and has upcoming bills to pay that he needs to be home for. Will trial off of restraints today and decrease the scheduled Ativan. Has received a lot of Ativan during his stay so would like to monitor him for at least 24 more hours to see how he does before considering discharge.    * Alcohol withdrawal (HCC) Unclear when last drink was but ethanol level was 108 on admission 8/11 at 2350. CIWA overnight ranging 13-16, most recently 13. CIWA protocol with ativan. Received 7mg  Atival total overnight (2mg  sch, 5mg  prn). He is still agitated but much improved from before. Given his prolonged withdrawal will ask psych to evaluate. -Psych consulted, appreciate recs -Progressive status for close monitoring -DC restraints, patient may ambulate at bedside. 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  Per office note from July 2019 he was supposed to be on it for 6 months only. Given his significant EtOH use and his recent fall from 7 ft, he seems to be high-risk for chronic anticoagulation. No hx of AF that I  am able to find.  -D/c'd xarelto -Started Lovenox for VTE ppx  Rhabdomyolysis Likely traumatic after fall. CK 6292 -> 3967. Renal function is improving with PO intake; AKI has resolved. -Encourage PO when able -Continuing IVF -AM BMP  Nondisplaced fracture of proximal phalanx of toe After fall. Pain seems to be well controlled.  -PRN Tylenol for pain control  Tobacco use 1 pack per day since age 21. Still smoking. -Nicotine patch  Diabetes (HCC) Holding metformin due to elevated lactate on admission. On Jardiance and glipizide at home. Hgb A1c 7.4. -SSI, hold home meds   Essential hypertension Home lisinopril and HCTZ.  -Lisinopril 20mg  qd    FEN/GI: heart healthy diet, NS IVF 140mL/hr PPx: lovenox Dispo:Pending PT recommendations  pending clinical improvement . Barriers include alcohol withdrawal, psych eval.   Subjective:  Pt feels better this morning but is concerned and angry about the fact that he's been in restraints. Also states he needs to get back home soon to pay his bills. Feels as if he's not being treated well or like a normal person.  Denies pain, speaking in full sentences today without falling asleep.  Objective: Temp:  [98.1 F (36.7 C)-98.6 F (37 C)] 98.2 F (36.8 C) (08/16 1100) Pulse Rate:  [92-106] 92 (08/16 1100) Resp:  [16-19] 18 (08/16 1100) BP: (100-172)/(57-100) 136/89 (08/16 1100) SpO2:  [90 %-100 %] 93 % (08/16 1100) Physical Exam: General: Laying in bed, loose wrist restraints in place HEENT: Healing abrasion over L head Cardiovascular: RRR Respiratory: CTAB Abdomen: soft, non-tender, non-distended  Laboratory: Most recent CBC Lab Results  Component Value Date   WBC 13.4 (H) 10/22/2021   HGB 12.7 (L) 10/22/2021   HCT 37.9 (L) 10/22/2021   MCV 85.2 10/22/2021   PLT 211 10/22/2021   Most recent BMP    Latest Ref Rng & Units 10/25/2021    6:00 AM  BMP  Glucose 70 - 99 mg/dL 211   BUN 8 - 23 mg/dL 12   Creatinine 1.55 -  1.24 mg/dL 2.08   Sodium 022 - 336 mmol/L 138   Potassium 3.5 - 5.1 mmol/L 3.6   Chloride 98 - 111 mmol/L 107   CO2 22 - 32 mmol/L 23   Calcium 8.9 - 10.3 mg/dL 9.3      Vonna Drafts, MD 10/25/2021, 12:02 PM  PGY-1, [facility] Family Medicine FPTS Intern pager: [phone], text pages welcome Secure chat group [employer] [facility]

## 2021-10-25 NOTE — Assessment & Plan Note (Addendum)
Home lisinopril and HCTZ.  -Lisinopril 40mg  qd

## 2021-10-25 NOTE — Evaluation (Signed)
Physical Therapy Evaluation Patient Details Name: Ernest White MRN: [id number] DOB: 1955/11/01 Today's Date: 10/25/2021  History of Present Illness  Pt is a 67 y/o male admitted following fall off retaining wall, likely from alcohol intoxication. Pt also with rhabdomyolysis, AKI, and alcohol withdrawal.  Clinical Impression  Pt admitted secondary to problem above with deficits below. Poor safety awareness and insight into current deficits. Requiring up to mod A for bed mobility, transfers, and ambulation this session. Pt currently lives alone and feel he is at increased risk for falls. Recommend SNF level therapies to increase independence and safety. Will continue to follow acutely.        Recommendations for follow up therapy are one component of a multi-disciplinary discharge planning process, led by the attending physician.  Recommendations may be updated based on patient status, additional functional criteria and insurance authorization.  Follow Up Recommendations Skilled nursing-short term rehab (<3 hours/day) Can patient physically be transported by private vehicle: No    Assistance Recommended at Discharge Frequent or constant Supervision/Assistance  Patient can return home with the following  A lot of help with walking and/or transfers;A lot of help with bathing/dressing/bathroom;Assistance with cooking/housework;Help with stairs or ramp for entrance;Assist for transportation    Equipment Recommendations Rolling walker (2 wheels)  Recommendations for Other Services       Functional Status Assessment Patient has had a recent decline in their functional status and demonstrates the ability to make significant improvements in function in a reasonable and predictable amount of time.     Precautions / Restrictions Precautions Precautions: Fall Restrictions Weight Bearing Restrictions: No      Mobility  Bed Mobility Overal bed mobility: Needs Assistance Bed Mobility:  Supine to Sit     Supine to sit: Mod assist     General bed mobility comments: Mod A for trunk assist to come to sitting. Increased time required.    Transfers Overall transfer level: Needs assistance Equipment used: Rolling walker (2 wheels) Transfers: Sit to/from Stand Sit to Stand: Min assist           General transfer comment: Min A for lift assist and steadying to stand. Increased time required to perform    Ambulation/Gait Ambulation/Gait assistance: Min assist, Mod assist Gait Distance (Feet): 8 Feet Assistive device: Rolling walker (2 wheels) Gait Pattern/deviations: Step-to pattern, Knee flexed in stance - right, Decreased stride length Gait velocity: Decreased     General Gait Details: Slow, very unsteady gait. Decreased safety awareness and required safety cues with use of RW. Pt fatiguing easily and required seated rest after short distance ambulation  Stairs            Wheelchair Mobility    Modified Rankin (Stroke Patients Only)       Balance Overall balance assessment: Needs assistance Sitting-balance support: Bilateral upper extremity supported Sitting balance-Leahy Scale: Poor Sitting balance - Comments: Reliant on BUE support   Standing balance support: Bilateral upper extremity supported Standing balance-Leahy Scale: Poor Standing balance comment: Reliant on BUE and external support                             Pertinent Vitals/Pain Pain Assessment Pain Assessment: Faces Faces Pain Scale: Hurts little more Pain Location: R foot Pain Descriptors / Indicators: Grimacing, Guarding Pain Intervention(s): Monitored during session, Limited activity within patient's tolerance, Repositioned    Home Living Family/patient expects to be discharged to:: Private residence Living Arrangements: Alone Available  Help at Discharge: Other (Comment) (reports no one) Type of Home: Apartment Home Access: Elevator       Home Layout: One  level Home Equipment: Shower seat;Cane - single point;Hand held shower head      Prior Function Prior Level of Function : Independent/Modified Independent                     Hand Dominance        Extremity/Trunk Assessment   Upper Extremity Assessment Upper Extremity Assessment: Defer to OT evaluation    Lower Extremity Assessment Lower Extremity Assessment: Generalized weakness    Cervical / Trunk Assessment Cervical / Trunk Assessment: Kyphotic  Communication   Communication: No difficulties  Cognition Arousal/Alertness: Awake/alert Behavior During Therapy: Impulsive Overall Cognitive Status: No family/caregiver present to determine baseline cognitive functioning                                 General Comments: Decreased safety awareness and poor insight into deficits. Impulsive throughout. When asked month/year pt replied the 23rd.        General Comments      Exercises     Assessment/Plan    PT Assessment Patient needs continued PT services  PT Problem List Decreased strength;Decreased balance;Decreased activity tolerance;Decreased mobility;Decreased knowledge of use of DME;Decreased knowledge of precautions;Decreased safety awareness;Decreased cognition       PT Treatment Interventions DME instruction;Gait training;Functional mobility training;Therapeutic exercise;Therapeutic activities;Balance training;Patient/family education    PT Goals (Current goals can be found in the Care Plan section)  Acute Rehab PT Goals Patient Stated Goal: to go home PT Goal Formulation: With patient Time For Goal Achievement: 11/08/21 Potential to Achieve Goals: Good    Frequency Min 2X/week     Co-evaluation               AM-PAC PT "6 Clicks" Mobility  Outcome Measure Help needed turning from your back to your side while in a flat bed without using bedrails?: A Little Help needed moving from lying on your back to sitting on the side of a  flat bed without using bedrails?: A Lot Help needed moving to and from a bed to a chair (including a wheelchair)?: A Lot Help needed standing up from a chair using your arms (e.g., wheelchair or bedside chair)?: A Lot Help needed to walk in hospital room?: A Lot Help needed climbing 3-5 steps with a railing? : Total 6 Click Score: 12    End of Session Equipment Utilized During Treatment: Gait belt Activity Tolerance: Patient limited by fatigue Patient left: in chair;with call bell/phone within reach;with nursing/sitter in room Psychiatrist present) Nurse Communication: Mobility status PT Visit Diagnosis: Unsteadiness on feet (R26.81);Muscle weakness (generalized) (M62.81);Difficulty in walking, not elsewhere classified (R26.2)    Time: 2563-8937 PT Time Calculation (min) (ACUTE ONLY): 17 min   Charges:   PT Evaluation $PT Eval Moderate Complexity: 1 Mod          Farley Ly, PT, DPT  Acute Rehabilitation Services  Office: [phone]   Lehman Prom 10/25/2021, 1:10 PM

## 2021-10-25 NOTE — Progress Notes (Signed)
FMTS Brief Progress Note  S: Seen at bedside with Dr. Wynelle Link.  Patient speaking with his eyes closed, speech fluctuated between coherent and mumbling.  Despite increased coherent speech he is unable to hold full conversation with [place]. He was agitated, and not allowing nurse and sitter to take vitals.  He is demanding that he be discharged home.  Nurse reported that they had loosened restraints and he immediately tried to pull out his IV, and has been swatting at nursing staff.   O: BP 126/74 (BP Location: Left Arm)   Pulse 100   Temp 98.1 F (36.7 C) (Oral)   Resp 18   Ht 5\' 11"  (1.803 m)   Wt (!) 138.8 kg   SpO2 98%   BMI 42.68 kg/m     A/P:  -Alcohol withdrawal overall improving more alert but still somewhat agitated.  Continue current plan, can attempt to transition off restraints if he remains consistently calm. - Orders reviewed. Labs for AM ordered, which was adjusted as needed.  - If condition changes, plan includes call rapid response for CIWA >20. -If patient is able to have full conversation, we will assess his capacity to leave AMA.  - Orders reviewed. Labs for AM ordered, which was adjusted as needed.  - If condition changes, please contact night team.   , MD 10/25/2021, 1:49 AM PGY-1, [facility] Family Medicine Night Resident  Please page [phone] with questions.

## 2021-10-25 NOTE — Consult Note (Signed)
Brief Psychiatry Consult Note   Date of service: October 25, 2021 Patient Name: Ernest White DOB: [birth year] MRN: [id number]  Agitation 2/2 hospital delirium vs alcohol withdrawal Alcohol use disorder  Discussed with primary team concern of oversedation and hospital delirium, given patient's change in agitation and age. Also because in CIWA, patient scored high on agitation, anxiety, and orientation/clouding. No autonomic changes. Currently patient is on scheduled Ativan taper, for withdrawal however concern that scheduled Ativan will worsen delirium.  However patient symptoms may also be behavioral, as during second encounter, when discussed EtOH use, patient would close his eyes and breathe heavy as of sleeping.  However with persistence, patient would start talking again. Stated that he does not drink that much alcohol, especially since he has been here. Patient avoided to answer questions about how much he used to drink prior to hospitalization. Patient was not able to recall what brought him into the hospital.  Patient was disoriented per brief note prior (8/16).  Patient declined to engage in attention testing.  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   Recommended that primary team discontinued scheduled Ativan for withdrawal, rather only have PRN Ativan. Also recommended delirium precautions. Recommended MOCA. Recommend TSH.  Patient is already on high dose thiamine, folate, and multivitamin. CIWA protocol already in place.   Signed: Princess Bruins, DO Psychiatry Resident, PGY-2 [facility] 10/25/2021, 5:45 PM

## 2021-10-26 ENCOUNTER — Other Ambulatory Visit (HOSPITAL_COMMUNITY): Payer: Self-pay

## 2021-10-26 DIAGNOSIS — F10929 Alcohol use, unspecified with intoxication, unspecified: Secondary | ICD-10-CM

## 2021-10-26 DIAGNOSIS — S92516A Nondisplaced fracture of proximal phalanx of unspecified lesser toe(s), initial encounter for closed fracture: Secondary | ICD-10-CM

## 2021-10-26 DIAGNOSIS — S0990XA Unspecified injury of head, initial encounter: Secondary | ICD-10-CM

## 2021-10-26 DIAGNOSIS — M546 Pain in thoracic spine: Secondary | ICD-10-CM

## 2021-10-26 DIAGNOSIS — R410 Disorientation, unspecified: Secondary | ICD-10-CM

## 2021-10-26 DIAGNOSIS — N179 Acute kidney failure, unspecified: Secondary | ICD-10-CM | POA: Diagnosis not present

## 2021-10-26 LAB — GLUCOSE, CAPILLARY
Glucose-Capillary: 130 mg/dL — ABNORMAL HIGH (ref 70–99)
Glucose-Capillary: 140 mg/dL — ABNORMAL HIGH (ref 70–99)
Glucose-Capillary: 168 mg/dL — ABNORMAL HIGH (ref 70–99)
Glucose-Capillary: 130 mg/dL — ABNORMAL HIGH (ref 70–99)
Glucose-Capillary: 142 mg/dL — ABNORMAL HIGH (ref 70–99)

## 2021-10-26 LAB — BASIC METABOLIC PANEL
Anion gap: 9 (ref 5–15)
BUN: 9 mg/dL (ref 8–23)
CO2: 24 mmol/L (ref 22–32)
Calcium: 9.5 mg/dL (ref 8.9–10.3)
Chloride: 107 mmol/L (ref 98–111)
Creatinine, Ser: 0.96 mg/dL (ref 0.61–1.24)
GFR, Estimated: >60 mL/min (ref >60)
Glucose, Bld: 143 mg/dL — ABNORMAL HIGH (ref 70–99)
Potassium: 3.5 mmol/L (ref 3.5–5.1)
Sodium: 140 mmol/L (ref 135–145)

## 2021-10-26 LAB — MAGNESIUM: Magnesium: 2.4 mg/dL (ref 1.7–2.4)

## 2021-10-26 LAB — TSH: TSH: 5.352 u[IU]/mL — ABNORMAL HIGH (ref 0.350–4.500)

## 2021-10-26 LAB — CULTURE, BLOOD (ROUTINE X 2)
Culture: NO GROWTH
Culture: NO GROWTH
Special Requests: ADEQUATE

## 2021-10-26 MED ORDER — EMPAGLIFLOZIN 25 MG PO TABS
25.0000 mg | ORAL_TABLET | Freq: Every day | ORAL | Status: DC
  Administered 2021-10-27: 25 mg via ORAL
  Filled 2021-10-26 (×2): qty 1

## 2021-10-26 MED ORDER — LISINOPRIL 20 MG PO TABS: 40.0000 mg | ORAL_TABLET | Freq: Every day | ORAL | Status: DC

## 2021-10-26 MED ORDER — IBUPROFEN 200 MG PO TABS
600.0000 mg | ORAL_TABLET | Freq: Once | ORAL | Status: AC
  Administered 2021-10-26: 600 mg via ORAL
  Filled 2021-10-26: qty 3

## 2021-10-26 MED ORDER — LISINOPRIL 20 MG PO TABS
40.0000 mg | ORAL_TABLET | Freq: Every day | ORAL | Status: DC
  Administered 2021-10-27: 40 mg via ORAL
  Filled 2021-10-26: qty 2

## 2021-10-26 MED ORDER — HALOPERIDOL LACTATE 5 MG/ML IJ SOLN
2.0000 mg | Freq: Once | INTRAMUSCULAR | Status: DC
Start: 2021-10-26 — End: 2021-10-26
  Filled 2021-10-26: qty 1

## 2021-10-26 MED ORDER — METFORMIN HCL 500 MG PO TABS
500.0000 mg | ORAL_TABLET | Freq: Two times a day (BID) | ORAL | Status: DC
  Administered 2021-10-26 – 2021-10-27 (×2): 500 mg via ORAL
  Filled 2021-10-26 (×2): qty 1

## 2021-10-26 MED ORDER — CHLORDIAZEPOXIDE HCL 5 MG PO CAPS
25.0000 mg | ORAL_CAPSULE | Freq: Three times a day (TID) | ORAL | Status: AC
  Administered 2021-10-26 – 2021-10-27 (×3): 25 mg via ORAL
  Filled 2021-10-26 (×3): qty 5

## 2021-10-26 MED ORDER — NICOTINE 21 MG/24HR TD PT24
21.0000 mg | MEDICATED_PATCH | Freq: Every day | TRANSDERMAL | 0 refills | Status: AC
  Filled 2021-10-26: qty 28, 28d supply, fill #0

## 2021-10-26 MED ORDER — CHLORDIAZEPOXIDE HCL 5 MG PO CAPS: 25.0000 mg | ORAL_CAPSULE | Freq: Three times a day (TID) | ORAL | Status: DC

## 2021-10-26 MED ORDER — CHLORDIAZEPOXIDE HCL 5 MG PO CAPS
25.0000 mg | ORAL_CAPSULE | Freq: Every day | ORAL | Status: DC
Start: 2021-10-28 — End: 2021-10-27

## 2021-10-26 MED ORDER — LISINOPRIL 20 MG PO TABS
20.0000 mg | ORAL_TABLET | Freq: Once | ORAL | Status: AC
Start: 2021-10-26 — End: 2021-10-26
  Administered 2021-10-26: 20 mg via ORAL
  Filled 2021-10-26: qty 1

## 2021-10-26 MED ORDER — HALOPERIDOL LACTATE 5 MG/ML IJ SOLN: 2.0000 mg | Freq: Once | INTRAMUSCULAR | Status: DC

## 2021-10-26 MED ORDER — CHLORDIAZEPOXIDE HCL 5 MG PO CAPS
25.0000 mg | ORAL_CAPSULE | Freq: Two times a day (BID) | ORAL | Status: DC
  Administered 2021-10-27: 25 mg via ORAL
  Filled 2021-10-26: qty 5

## 2021-10-26 NOTE — Progress Notes (Signed)
Orthopedic Tech Progress Note Patient Details:  Ernest White 10-13-1955 [id number]    Ortho Devices Type of Ortho Device: Postop shoe/boot Ortho Device/Splint Location: For RLE Ortho Device/Splint Interventions: Ordered, Application   Post Interventions Patient Tolerated: Fair Instructions Provided: Care of device, Adjustment of device  Ananda Sitzer [name] 10/26/2021, 7:04 PM

## 2021-10-26 NOTE — Discharge Summary (Addendum)
Family Medicine Teaching [facility] Discharge Summary  Patient name: Ernest White Medical record number: [id number] Date of birth: 1955-06-17 Age: 66 y.o. Gender: male Date of Admission: 10/20/2021  Date of Discharge: 8/18 Admitting Physician: Nestor Ramp, MD  Primary Care Provider: Erick Alley, DO Consultants: psychiatry  Indication for Hospitalization: Fall, AKI, alcohol intoxication and subsequent withdrawal  Brief Hospital Course:  Ernest White is a 66 y.o.male with a history of alcohol use, T2DM, HTN who was admitted to the [facility] Medicine Teaching Service at [facility] after a fall from 40ft, found to have an AKI which resolved then was in acute EtOH withdrawal. Also found to have rhabdo. His hospital course is detailed below:  Alcohol Withdrawal Ethanol level 108 on admission and was found in acute intoxication. Was acutely agitated and required restraints for multiple days. Continued with alcohol withdrawal protocol, attempted Librium but found more success with Ativan, and Ativan was given frequently to control withdrawal symptoms. CIWAs gradually decreased over time. Due to large amount of Ativan given during his stay, pt was started on Librium taper prior to discharge and was sent home with taper for 2 additional days.  Level 2 Trauma after fall from 52ft Patient presented initially after a fall with head trauma on Xarelto at home. CT Head was negative for intracranial bleed. Suspect some of patient's agitation was post-concussive. Large ~10cm hematoma noted over L scalp.  Nondisplaced fracture of proximal phalanx of toe noted on Right foot x-ray after fall. Pain well controlled during stay. Avoided opioids due to heavy etOH abuse. Sent home with Tylenol and hard shoe for foot fracture. Admission imaging was otherwise negative for acute traumatic injury.  Acute Kidney Injury Cr elevated to 1.5 on admission, improved to 1.02 after IV fluids.  Rhabdomyolysis CK elevated  to 6,292, repeat was 3967. Received IVF. Kidneys recovered well as above.  Other chronic conditions: Diabetes - given SSI in the hospital (metformin held due to elevated lactate on admission) - metformin switched to 1000mg  daily extended release due to GI upset. Home Jardiance continued at discharge. Dc'd glipizide.  Tobacco use - nicotine patch given. Sent home w/ nicotine patch.  Chronic anticoagulation - Was on Xarelto for b/l PE in 08/2017; however this was only supposed to be for 6 months. Xarelto was dc'd given significant EtOH use and frequent falls.    PCP Follow-up Recommendations: Chronic Xarelto dc'd during his stay, thought to be high risk for chronic anticoagulation given EtOH use and frequent falls, no hx AF seen. May re-evaluate need for anticoagulation as needed F/u diabetes regimen - dc'd glipizide during his stay. Also changed metformin to extended release due to GI upset. Recommend A1c re-check in 67mo.  Ensure pt is able to safely ambulate and perform ADLs, home health PT ordered at discharge.  TSH slightly elevated, likely euthyroid sick syndrome, consider interval repeat upon discharge Recommend following up on patient's heavy etOH use, consider addition of a medication to aid with cravings such as naltrexone or acamprosate. We are increasing patient's gabapentin at discharge which may also be helpful in addressing cravings.      Imaging Studies Performed:   CT CHEST ABD PELVIS Radiologist impression: 1. No acute post-traumatic abnormalities within the chest, abdomen or pelvis. 2. Sigmoid diverticulosis. 3. Bilateral fat containing inguinal hernias.   CT HEAD WO CONTRAST CT MAXILLOFACIAL WO CONTRAST CT CERVICAL SPINE WO CONTRAST Radiologist impression: 1. No acute intracranial abnormality. 2. No facial or calvarial fracture. 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 3. No acute fracture of the cervical spine.  X-RAY  FOOT 2 VIEWS RIGHT Radiologist impression: Nondisplaced fracture through the distal aspect of the proximal third phalanx. No other acute abnormalities.   CXR No active disease, no rib fractures XR PELVIS no acute abnormalities  Discharge Diagnoses/Problem List:  Principal Problem for Admission: Alcohol withdrawal Other Problems addressed during stay:  Principal Problem:   Alcohol withdrawal (HCC) Active Problems:   Essential hypertension   Diabetes (HCC)   Tobacco use   Nondisplaced fracture of proximal phalanx of toe   Acute kidney injury (HCC)   Rhabdomyolysis   Chronic anticoagulation   Acute bilateral thoracic back pain   Acute head injury   Alcoholic intoxication with complication (HCC)   Delirium     Disposition: home (declined SNF)  Discharge Condition: stable  Discharge Exam:  Vitals:   10/27/21 0425 10/27/21 0743  BP: (!) 156/85 135/82  Pulse: 82 91  Resp: 18 16  Temp: (!) 97.4 F (36.3 C) 98.4 F (36.9 C)  SpO2: 95% 93%   Gen: Well appearing, conversational, pleasant but mildly irritable, sitting up in chair and eating breakfast CV: RRR Pulm: CTAB MSK: R foot hard shoe Neuro: A&Ox3   Significant Procedures: n/a  Significant Labs and Imaging:  No results for input(s): "WBC", "HGB", "HCT", "PLT" in the last 48 hours. Recent Labs  Lab 10/26/21 0248  NA 140  K 3.5  CL 107  CO2 24  GLUCOSE 143*  BUN 9  CREATININE 0.96  CALCIUM 9.5  MG 2.4    See hospital course for imaging  Results/Tests Pending at Time of Discharge: n/a  Discharge Medications:  Allergies as of 10/27/2021       Reactions   Penicillins Anaphylaxis   Has patient had a PCN reaction causing immediate rash, facial/tongue/throat swelling, SOB or lightheadedness with hypotension: Yes Has patient had a PCN reaction causing severe rash involving mucus membranes or skin necrosis: No Has patient had a PCN reaction that required hospitalization pt was hospitalized at time of  reaction Has patient had a PCN reaction occurring within the last 10 years: No If all of the above answers are "NO", then may proceed with Cephalosporin use.   Aspirin Other (See Comments)   Stomach pain        Medication List     STOP taking these medications    glipiZIDE 2.5 MG 24 hr tablet Commonly known as: GLUCOTROL XL   oxyCODONE 5 MG immediate release tablet Commonly known as: Roxicodone   Trelegy Ellipta 100-62.5-25 MCG/ACT Aepb Generic drug: Fluticasone-Umeclidin-Vilant   Xarelto 20 MG Tabs tablet Generic drug: rivaroxaban       TAKE these medications    acetaminophen 500 MG tablet Commonly known as: TYLENOL Take 1,000 mg by mouth every 6 (six) hours as needed for mild pain.   albuterol 108 (90 Base) MCG/ACT inhaler Commonly known as: VENTOLIN HFA INHALE 2 PUFFS INTO THE LUNGS EVERY 6 HOURS AS NEEDED FOR WHEEZING OR SHORTNESS OF BREATH What changed:  how much to take how to take this when to take this reasons to take this additional instructions   atorvastatin 20 MG tablet Commonly known as: LIPITOR Take 20 mg by mouth every evening.   chlordiazePOXIDE 25 MG capsule Commonly known as: LIBRIUM Take 1 capsule (25 mg total) by mouth 2 (two) times daily.   chlordiazePOXIDE 25 MG capsule Commonly known as: LIBRIUM Take 1 capsule (25 mg total) by mouth daily.  Start taking on: October 28, 2021   cyanocobalamin 1000 MCG tablet Commonly known as: VITAMIN B12 Take 1,000 mcg by mouth daily.   diclofenac Sodium 1 % Gel Commonly known as: Voltaren Apply 2 g topically 4 (four) times daily.   gabapentin 300 MG capsule Commonly known as: NEURONTIN Take 1 capsule (300 mg total) by mouth 3 (three) times daily. What changed: when to take this   hydrochlorothiazide 25 MG tablet Commonly known as: HYDRODIURIL TAKE 1 TABLET(25 MG) BY MOUTH DAILY What changed:  how much to take how to take this when to take this additional instructions   hydrocortisone  2.5 % rectal cream Commonly known as: Anusol-HC Place 1 application rectally 2 (two) times daily.   Januvia 100 MG tablet Generic drug: sitaGLIPtin Take 100 mg by mouth daily.   Jardiance 25 MG Tabs tablet Generic drug: empagliflozin TAKE 1 TABLET(25 MG) BY MOUTH DAILY What changed: See the new instructions.   lidocaine 5 % Commonly known as: Lidoderm Place 1 patch onto the skin daily. Remove & Discard patch within 12 hours or as directed by MD What changed: Another medication with the same name was removed. Continue taking this medication, and follow the directions you see here.   lisinopril 40 MG tablet Commonly known as: ZESTRIL TAKE 1 TABLET(40 MG) BY MOUTH DAILY What changed:  how much to take how to take this when to take this additional instructions   metFORMIN 1000 MG tablet Commonly known as: GLUCOPHAGE Take 1 tablet (1,000 mg total) by mouth daily with breakfast. What changed:  how much to take how to take this when to take this additional instructions Another medication with the same name was removed. Continue taking this medication, and follow the directions you see here.   nicotine 21 mg/24hr patch Commonly known as: NICODERM CQ - dosed in mg/24 hours Place 1 patch (21 mg total) onto the skin daily.   Symbicort 160-4.5 MCG/ACT inhaler Generic drug: budesonide-formoterol Inhale 2 puffs into the lungs 2 (two) times daily.   topiramate 25 MG tablet Commonly known as: TOPAMAX Take 50 mg by mouth 2 (two) times daily.               Durable Medical Equipment  (From admission, onward)           Start     Ordered   10/26/21 1032  For home use only DME Walker rolling  Once       Question Answer Comment  Walker: With 5 Inch Wheels   Patient needs a walker to treat with the following condition Unstable balance      10/26/21 1031            Discharge Instructions: Please refer to Patient Instructions section of EMR for full details.   Patient was counseled important signs and symptoms that should prompt return to medical care, changes in medications, dietary instructions, activity restrictions, and follow up appointments.   Follow-Up Appointments:  Follow-up Information     Erick Alley, DO Follow up on 10/31/2021.   Specialty: Family Medicine Why: You have an appt in Dr Barnett Applebaum office at 8:30 am Contact information: [address] [place] [address] [phone]                 Vonna Drafts, MD 10/27/2021, 12:06 PM PGY-1, [facility] Family [facility]   I have evaluated this patient along with Dr. Barb Merino and reviewed the above note, making necessary revisions.  Dorothyann Gibbs, MD 10/27/2021,  12:56 PM PGY-2, [facility] Family Medicine

## 2021-10-26 NOTE — Progress Notes (Signed)
FMTS Brief Progress Note  S: Received secure chat message that patient had gotten out of bed, this was a witnessed event by RN and NT.  His movement was purposeful and he did not fall per nursing team.  I evaluated the patient at bedside.  At the time of my arrival patient asked what medicine that he received and why he was feeling dizzy and unable to walk.  I explained to them that he did not receive his Ativan dose, as we are trying to wean him off.  We discussed that his symptoms may related to not receiving the medication, and he stated that he did not want to receive any more.  Patient seemed concerned that he was unable to walk, and was willing to get back into the bed.  The nurse and I tried to assist the patient into bed, however we were unable to lift him on our own.  The patient endorsed that his right leg was hurting and he was concerned because he had surgery on that ankle before.  I told him that I would reevaluate him after getting him into the bed.  A lift was brought in and he was successfully lifted back into the bed.  After looking at his leg patient began moving it and was no longer complaining of pain.   O: BP (!) 162/100 (BP Location: Right Arm)   Pulse 96   Temp 98.1 F (36.7 C) (Axillary)   Resp (!) 22   Ht 5\' 11"  (1.803 m)   Wt (!) 138.8 kg   SpO2 96%   BMI 42.68 kg/m   MSK: Left leg 5 out of 5 strength, no tenderness to palpation Right leg 5 out of 5 strength.  Tenderness over distal third metatarsal and proximal phalanx, consistent with known nondisplaced fracture. Neuro: Alert and oriented to person place time and event Psych: Cooperative, but agitated  A/P: - Encourage patient to stay in the bed until he is no longer dizzy - Continue to hold off on Ativan dosing as patient requests - Continue to evaluate patient's ambulatory ability - Known fracture of distal aspect of the proximal third phalanx, as this is where patient is tender we will not order x-rays at this  time.  Increased pain is likely related to bearing weight.  I believe a boot will be beneficial before discharge.  - Orders reviewed. Labs for AM ordered, which was adjusted as needed.  - If condition changes, please contact family medicine service  , MD 10/26/2021, 5:29 AM PGY-1, [facility] Family Medicine Night Resident  Please page [phone] with questions.

## 2021-10-26 NOTE — Progress Notes (Signed)
Physical Therapy Treatment Patient Details Name: Ernest White MRN: [id number] DOB: 10/23/1955 Today's Date: 10/26/2021   History of Present Illness Pt is a 66 y/o male admitted following fall off retaining wall, likely from alcohol intoxication. Pt also with rhabdomyolysis, AKI, and alcohol withdrawal.    PT Comments    Pt progressing towards goals, but remains limited secondary to lethargy. Able to slightly increase ambulation distance, however, remains very unsteady and unsafe with mobility. Pt reporting increased R foot pain and would likely benefit from post op shoe. Recommendations for SNF remain appropriate. Will continue to follow acutely.     Recommendations for follow up therapy are one component of a multi-disciplinary discharge planning process, led by the attending physician.  Recommendations may be updated based on patient status, additional functional criteria and insurance authorization.  Follow Up Recommendations  Skilled nursing-short term rehab (<3 hours/day) Can patient physically be transported by private vehicle: No   Assistance Recommended at Discharge Frequent or constant Supervision/Assistance  Patient can return home with the following A lot of help with walking and/or transfers;A lot of help with bathing/dressing/bathroom;Assistance with cooking/housework;Help with stairs or ramp for entrance;Assist for transportation   Equipment Recommendations  Rolling walker (2 wheels)    Recommendations for Other Services       Precautions / Restrictions Precautions Precautions: Fall Restrictions Weight Bearing Restrictions: No     Mobility  Bed Mobility Overal bed mobility: Needs Assistance Bed Mobility: Sit to Supine       Sit to supine: Mod assist, +2 for physical assistance   General bed mobility comments: ModA +2 for trunk and LE assist. Increased time required.    Transfers Overall transfer level: Needs assistance Equipment used: Rolling walker  (2 wheels) Transfers: Sit to/from Stand Sit to Stand: Mod assist, +2 safety/equipment, +2 physical assistance           General transfer comment: Assist for lifting and steadying from elevated surface.    Ambulation/Gait Ambulation/Gait assistance: Min assist, Mod assist, +2 physical assistance, +2 safety/equipment Gait Distance (Feet): 25 Feet Assistive device: Rolling walker (2 wheels) Gait Pattern/deviations: Step-to pattern, Decreased stride length, Decreased weight shift to right Gait velocity: Decreased     General Gait Details: Slow, antalgic gait. Increased R foot pain and unsteadiness noted. With increased distance, BLE became very shaky. Required mod assist with managing RW and continuous cues for safe use. Close chair follow for safety.   Stairs             Wheelchair Mobility    Modified Rankin (Stroke Patients Only)       Balance Overall balance assessment: Needs assistance Sitting-balance support: No upper extremity supported, Feet supported Sitting balance-Leahy Scale: Fair     Standing balance support: Bilateral upper extremity supported Standing balance-Leahy Scale: Poor Standing balance comment: Reliant on BUE and external support                            Cognition Arousal/Alertness: Lethargic Behavior During Therapy: Restless Overall Cognitive Status: No family/caregiver present to determine baseline cognitive functioning                                 General Comments: Increased lethargy today. Decreased safety awareness and required cues throughout        Exercises      General Comments  Pertinent Vitals/Pain Pain Assessment Pain Assessment: Faces Faces Pain Scale: Hurts even more Pain Location: R foot Pain Descriptors / Indicators: Grimacing, Guarding Pain Intervention(s): Limited activity within patient's tolerance, Monitored during session, Repositioned    Home Living                           Prior Function            PT Goals (current goals can now be found in the care plan section) Acute Rehab PT Goals Patient Stated Goal: to go to sleep PT Goal Formulation: With patient Time For Goal Achievement: 11/08/21 Potential to Achieve Goals: Good Progress towards PT goals: Progressing toward goals    Frequency    Min 2X/week      PT Plan Current plan remains appropriate    Co-evaluation PT/OT/SLP Co-Evaluation/Treatment: Yes Reason for Co-Treatment: For patient/therapist safety;To address functional/ADL transfers PT goals addressed during session: Mobility/safety with mobility;Balance        AM-PAC PT "6 Clicks" Mobility   Outcome Measure  Help needed turning from your back to your side while in a flat bed without using bedrails?: A Little Help needed moving from lying on your back to sitting on the side of a flat bed without using bedrails?: A Lot Help needed moving to and from a bed to a chair (including a wheelchair)?: A Lot Help needed standing up from a chair using your arms (e.g., wheelchair or bedside chair)?: A Lot Help needed to walk in hospital room?: A Lot Help needed climbing 3-5 steps with a railing? : Total 6 Click Score: 12    End of Session Equipment Utilized During Treatment: Gait belt Activity Tolerance: Patient limited by fatigue Patient left: in bed;with call bell/phone within reach;with bed alarm set;with nursing/sitter in room Nurse Communication: Mobility status PT Visit Diagnosis: Unsteadiness on feet (R26.81);Muscle weakness (generalized) (M62.81);Difficulty in walking, not elsewhere classified (R26.2)     Time: 7517-0017 PT Time Calculation (min) (ACUTE ONLY): 15 min  Charges:  $Gait Training: 8-22 mins                     Farley Ly, PT, DPT  Acute Rehabilitation Services  Office: [phone]    Le[facility] Prom 10/26/2021, 12:09 PM

## 2021-10-26 NOTE — Consult Note (Signed)
[facility] Psychiatry New Face-to-Face Psychiatric Evaluation  Date of Service: October 26, 2021 Name: Ernest White DOB: [birth year] MRN: [id number] Reason for Consult: "Agitation related to alcohol withdrawal, delirium" Requesting Provider: Nestor Ramp, MD  Assessment  Ernest White is a 66 y.o. male admitted medically for 10/20/2021 11:41 PM for fall while intoxicated. He carries the psychiatric diagnoses of alcohol use d/o, and tobacco use d/o and has  has a past medical history of Arthritis, COPD (chronic obstructive pulmonary disease) (HCC), Diabetes mellitus without complication (HCC), Gout, and Hypertension.   Agitation 2/2 hospital delirium vs alcohol withdrawal Alcohol use disorder  Discussed with primary team concern of oversedation and hospital delirium, given patient's change in agitation and age. Without scheduled Ativan and restraints, patient today was oriented to person, place, month, and year. Recommend continuing PRN Ativan per CIWA protocol, continuing delirium precautions.    Today on exam patient continued to close his eyes and make snoring noises during encounter in between answering questions. Patient answered questions to determine orientation but declined to engage in [facility]. Recommend primary team complete MOCA.  Please see plan below for detailed recommendations.   Diagnoses:  Active Hospital problems: Principal Problem:   Alcohol withdrawal (HCC) Active Problems:   Essential hypertension   Diabetes (HCC)   Tobacco use   Nondisplaced fracture of proximal phalanx of toe   Acute kidney injury (HCC)   Rhabdomyolysis   Chronic anticoagulation   Acute bilateral thoracic back pain   Acute head injury   Alcoholic intoxication with complication (HCC)   Delirium   Plan  ## Safety and Observation Level:  - Based on my clinical evaluation, I estimate the patient to be at low risk of self harm in the current setting - At this time, we recommend a  routine level of observation. This decision is based on my review of the chart including patient's history and current presentation, interview of the patient, mental status examination, and consideration of suicide risk including evaluating suicidal ideation, plan, intent, suicidal or self-harm behaviors, risk factors, and protective factors. This judgment is based on our ability to directly address suicide risk, implement suicide prevention strategies and develop a safety plan while the patient is in the clinical setting. Please contact our team if there is a concern that risk level has changed.  ## Medications:  -- we recommend discontinuing scheduled benzodiazepine  ## Medical Decision Making Capacity:  Formal decision making capacity was not assessed for any particular decision as part of routine psychiatric evaluation   ## Further Work-up:  -- Recommend baseline MOCA during this admission, and followup [facility] outpatient -- Most recent EKG on Friday October 20 2021 23:55:50 EDT had QtC of 448 -- A1c 7.4 (10/21/2021),  -- TSH 5.352 (10/26/2021)   ## Disposition:  -- There are no current psychiatric contraindications to discharge at this time. Patient denied residential rehab, intensive outpatient, other substance use resources.  ## Behavioral / Environmental:  -- CIWA per protocol -- Delirium precautions -- Aspiration precautions  ##Legal Status -- Voluntary   Thank you for this consult request. Recommendations have been communicated to the primary team. We will sign off at this time; please do not hesitate to reconsult psychiatry if any additional questions or concerns arise.   Mariah Milling, Medical Student  History  Relevant Aspects of Hospital Course:  Admitted on 10/20/2021 for Hypokalemia [E87.6] Lactic acidosis [E87.20] Acute kidney injury (HCC) [N17.9] Concussion without loss of consciousness, initial encounter [S06.0X0A] Acute head injury,  initial encounter  [id number] Alcoholic intoxication with complication ([place]) [id number] Acute bilateral thoracic back pain [M54.6].  Patient Report:   Patient was initially seen at bedside with Psychiatry team. Today, patient was again difficult to rouse and continued to close his eyes and snore during the interview- appears to be behavioral. He participated for several minutes to determine orientation, but declined further participation. He woke to complain of leg pain and state that he would like to go home.   ROS:  Mood: "awful"  - did not answer further questions on psych ROS  Psychiatric History: limited due to limited patient engagement Information collected from chart review and patient   Family psych history: not commented on  Social History:  Tobacco use: yes Alcohol use: yes   Family History:  The patient's family history includes Cancer in his sister and sister; Cancer - Lung in his father; Deep vein thrombosis in his brother; Heart disease in his sister; Other in his sister; Pulmonary embolism in his brother.  Medical History: Past Medical History:  Diagnosis Date   Arthritis    COPD (chronic obstructive pulmonary disease) ([place])    Diabetes mellitus without complication ([place])    Gout    Hypertension     Surgical History: Past Surgical History:  Procedure Laterality Date   APPENDECTOMY     HERNIA REPAIR      Medications:   Current Facility-Administered Medications:    acetaminophen (TYLENOL) tablet 1,000 mg, 1,000 mg, Oral, Q6H PRN, August Albino, MD, 1,000 mg at 10/26/21 0622   albuterol (PROVENTIL) (2.5 MG/3ML) 0.083% nebulizer solution 2.5 mg, 2.5 mg, Nebulization, Q6H PRN, Dickie La, MD, 2.5 mg at 10/21/21 1036   chlordiazePOXIDE (LIBRIUM) capsule 25 mg, 25 mg, Oral, TID, 25 mg at 10/26/21 1418 **FOLLOWED BY** [START ON 10/27/2021] chlordiazePOXIDE (LIBRIUM) capsule 25 mg, 25 mg, Oral, BID **FOLLOWED BY** [START ON 10/28/2021] chlordiazePOXIDE (LIBRIUM) capsule 25 mg, 25 mg,  Oral, Daily, Espinoza, Alejandra, DO   cyclobenzaprine (FLEXERIL) tablet 10 mg, 10 mg, Oral, TID PRN, August Albino, MD, 10 mg at 10/25/21 1543   empagliflozin (JARDIANCE) tablet 25 mg, 25 mg, Oral, Daily, Espinoza, Alejandra, DO   enoxaparin (LOVENOX) injection 70 mg, 70 mg, Subcutaneous, Q24H, Espinoza, Alejandra, DO, 70 mg at 10/25/21 2149   feeding supplement (GLUCERNA SHAKE) (GLUCERNA SHAKE) liquid 237 mL, 237 mL, Oral, TID BM, Mahmood, Atif, MD, 237 mL at 10/26/21 1419   fluticasone furoate-vilanterol (BREO ELLIPTA) 100-25 MCG/ACT 1 puff, 1 puff, Inhalation, Daily, 1 puff at 10/25/21 1005 **AND** umeclidinium bromide (INCRUSE ELLIPTA) 62.5 MCG/ACT 1 puff, 1 puff, Inhalation, Daily, Jim Like B, MD, 1 puff at [id number]   folic acid (FOLVITE) tablet 1 mg, 1 mg, Oral, Daily, Jim Like B, MD, 1 mg at 10/26/21 0939   [START ON 10/27/2021] lisinopril (ZESTRIL) tablet 40 mg, 40 mg, Oral, Daily, Espinoza, Alejandra, DO   LORazepam (ATIVAN) tablet 1-4 mg, 1-4 mg, Oral, Q2H PRN **OR** LORazepam (ATIVAN) injection 1-4 mg, 1-4 mg, Intravenous, Q2H PRN, Dameron, Marisa, DO   metFORMIN (GLUCOPHAGE) tablet 500 mg, 500 mg, Oral, BID WC, Espinoza, Alejandra, DO   multivitamin with minerals tablet 1 tablet, 1 tablet, Oral, Daily, Jim Like B, MD, 1 tablet at 10/26/21 [id number]   nicotine (NICODERM CQ - dosed in mg/24 hours) patch 21 mg, 21 mg, Transdermal, Daily, Zola Button, MD, 21 mg at 10/26/21 0940   thiamine (VITAMIN B1) tablet 100 mg, 100 mg, Oral, Daily, 100 mg at 10/26/21 0939 **OR** thiamine (VITAMIN B1)  injection 100 mg, 100 mg, Intravenous, Daily, Alicia Amel, MD  Allergies: Allergies  Allergen Reactions   Penicillins Anaphylaxis    Has patient had a PCN reaction causing immediate rash, facial/tongue/throat swelling, SOB or lightheadedness with hypotension: Yes Has patient had a PCN reaction causing severe rash involving mucus membranes or skin necrosis: No Has patient had a PCN  reaction that required hospitalization pt was hospitalized at time of reaction Has patient had a PCN reaction occurring within the last 10 years: No If all of the above answers are "NO", then may proceed with Cephalosporin use.   Aspirin Other (See Comments)    Stomach pain     Objective  Vital signs:  BMI Body mass index is 42.68 kg/m. Temp:  [98.1 F (36.7 C)-98.5 F (36.9 C)] 98.5 F (36.9 C) (08/17 1219) Pulse Rate:  [67-116] 93 (08/17 1219) Resp:  [18-22] 18 (08/17 1219) BP: (146-164)/(83-112) 164/112 (08/17 1219) SpO2:  [90 %-96 %] 94 % (08/17 1219)  Psychiatric Specialty Exam: General Appearance:Appropriate for Environment; Casual   Eye Contact:Absent   Speech:Garbled  At times, mostly clear. Difficult to understand due to "somnolence" Volume:Normal    Mood:Irritable "AWFUL"  Affect:Constricted    Thought Process:Goal Directed  Descriptions of Associations:Intact   Orientation:Full (Time, Place and Person)   Thought Content: goal-directed, wants to go home  Hallucinations: None  Ideas of Reference:None   Suicidal Thoughts:No  Homicidal Thoughts:No   Memory:-- (unable to assess)    Judgement:Other (comment) (unable to assess)  Insight:Poor    Psychomotor Activity:Normal    Concentration:Poor  Attention Span:Poor  Recall:Fair    Fund of Knowledge:Poor (stated current president was Trump)    Language:Fair    Handed: did not assess  Assets:Resilient  Sleep:Good          CIWA:CIWA-Ar Total: 0     Physical Exam Constitutional:      General: He is not in acute distress. HENT:     Head: Normocephalic and atraumatic.  Pulmonary:     Effort: Pulmonary effort is normal.  Neurological:     Mental Status: He is oriented to person, place, and time.       Signed: Mariah Milling, Medical Student Psychiatry Consult Service [facility] 10/26/2021, 4:44 PM

## 2021-10-26 NOTE — Progress Notes (Signed)
Patient because increasingly agitated demanding to leave hospital due to "have to pay bills". Pt made aggressive remarks to sitter, these threats were also accompanied with remarks to ethnicity of sitter.  Family medicine was paged. Patient was initially calmer with two doctors at bedside, but eventually digressed back to threats of violence when the physicians stated that patient has to wait until morning to leave the hospital.  Family medicine placed an order for [facility] but then stated to hold off as his agitation subsided. So this nurse is not to administer order at this time.   Patient is now in reclining chair and making rude comments to this nurse and the sitter. Occasionally yelling.

## 2021-10-26 NOTE — Progress Notes (Signed)
Interim Progress Note  Received chat from RN stating that patient fell, VSS, and he was without complaints/pain. This was a witnessed fall, NT was with him. RN reports that patient was getting out of bathroom, was trying to get hand santizer from wall dispenser and slipped back. NT believes he hit his head on the door.   Patient has no pain at the moment. No neurological changes.    O:  Blood pressure (!) 160/97, pulse 93, temperature 98.5 F (36.9 C), temperature source Oral, resp. rate 18, height 5\' 11"  (1.803 m), weight (!) 138.8 kg, SpO2 95 %. Gen: NAD, pleasant HEENT: Old abrasion to head, not actively breathing  Resp: Normal respiratory effort Neuro: Awake, alert, speech is clear, no focal deficits  A/P:  Witnessed fall  Reportedly hit head on door. This was a ground level fall. He currently has no focal deficits. He is mentating well. RN reports he has had a great day today- much improved from previous. He has not received any PRN ativan for CIWA's and is only on Librium taper. He is not somnolent. No new abrasion on head or active bleeding.  -Up with assistance -Fall precautions -Defer CT head unless he has clinical changes

## 2021-10-26 NOTE — Progress Notes (Signed)
Patient has once again refused any direction and has purposefully placed self on floor. This event was wittinessed by this RN and Mia Vang NT. He was slow and purposeful in movement and is now sitting on mat at bedside.   Message sent to [facility] MD, [facility] DO.

## 2021-10-26 NOTE — Progress Notes (Signed)
CSW acknowledging consult and recommendation for SNF placement. However, patient confused at this time, unable to participate in assessment. CSW attempted to reach patient's contacts: number for sister does not seem to be correct, and left a voicemail for significant other listed. Awaiting call back.  Blenda Nicely, [place] Clinical Social Worker [phone]

## 2021-10-26 NOTE — Progress Notes (Deleted)
Orthopedic Tech Progress Note Patient Details:  Ernest White [birth year] [id number]  Pt did not want PO shoe on at this time, it is now next to her [facility] shoe.  Ortho Devices Type of Ortho Device: Postop shoe/boot Ortho Device/Splint Location: For RLE at bedside Ortho Device/Splint Interventions: Ordered   Post Interventions Instructions Provided: Care of device, Adjustment of device  [name] [name] 10/26/2021, 6:59 PM

## 2021-10-26 NOTE — Evaluation (Signed)
Occupational Therapy Evaluation Patient Details Name: Ernest White MRN: [id number] DOB: August 24, 1955 Today's Date: 10/26/2021   History of Present Illness Pt is a 66 y/o male admitted following fall off retaining wall, likely from alcohol intoxication. Pt also with rhabdomyolysis, AKI, and alcohol withdrawal.   Clinical Impression   PTA, pt was living alone and performing BADLs; uses bus system for transportation. Pt currently requiring Mod A for UB ADLs, Max A for LB ADLs, and Mod A +2 for functional mobility with RW and chair follow. Pt presenting with lethargy, poor arousal and following commands, decreased balance, and weakness. Pt also with swelling at R food; elevating at end of session, placing ice pack, and notified RN. Pt would benefit from further acute OT to facilitate safe dc. Recommend dc to SNF for further OT to optimize safety, independence with ADLs, and return to PLOF.       Recommendations for follow up therapy are one component of a multi-disciplinary discharge planning process, led by the attending physician.  Recommendations may be updated based on patient status, additional functional criteria and insurance authorization.   Follow Up Recommendations  Skilled nursing-short term rehab (<3 hours/day)    Assistance Recommended at Discharge    Patient can return home with the following A little help with walking and/or transfers;A little help with bathing/dressing/bathroom    Functional Status Assessment  Patient has had a recent decline in their functional status and demonstrates the ability to make significant improvements in function in a reasonable and predictable amount of time.  Equipment Recommendations  BSC/3in1    Recommendations for Other Services PT consult     Precautions / Restrictions Precautions Precautions: Fall Restrictions Weight Bearing Restrictions: No      Mobility Bed Mobility Overal bed mobility: Needs Assistance Bed Mobility: Sit to  Supine       Sit to supine: Mod assist, +2 for physical assistance   General bed mobility comments: ModA +2 for trunk and LE assist. Increased time required.    Transfers Overall transfer level: Needs assistance Equipment used: Rolling walker (2 wheels) Transfers: Sit to/from Stand Sit to Stand: Mod assist, +2 safety/equipment, +2 physical assistance           General transfer comment: Assist for lifting and steadying from elevated surface.      Balance Overall balance assessment: Needs assistance Sitting-balance support: No upper extremity supported, Feet supported Sitting balance-Leahy Scale: Fair     Standing balance support: Bilateral upper extremity supported Standing balance-Leahy Scale: Poor Standing balance comment: Reliant on BUE and external support                           ADL either performed or assessed with clinical judgement   ADL Overall ADL's : Needs assistance/impaired Eating/Feeding: Set up;Sitting;Supervision/ safety   Grooming: Minimal assistance;Sitting   Upper Body Bathing: Moderate assistance;Sitting   Lower Body Bathing: Maximal assistance;Sit to/from stand   Upper Body Dressing : Moderate assistance;Standing   Lower Body Dressing: Maximal assistance;Sit to/from stand Lower Body Dressing Details (indicate cue type and reason): doff socks Toilet Transfer: Minimal assistance;Moderate assistance;+2 for physical assistance;+2 for safety/equipment;Ambulation;Rolling walker (2 wheels) Toilet Transfer Details (indicate cue type and reason): Mod A +2 for power up and very close Min A +2 for maintaining balance         Functional mobility during ADLs: Moderate assistance;+2 for physical assistance (Min A +2 for maintaining balance and assist throughout for RW) General  ADL Comments: Pt presenting with decreased balance, strength, and arousal     Vision   Additional Comments: Keeping eyes closed; lethargy?     Perception      Praxis      Pertinent Vitals/Pain Pain Assessment Pain Assessment: Faces Faces Pain Scale: Hurts even more Pain Location: R foot Pain Descriptors / Indicators: Grimacing, Guarding Pain Intervention(s): Monitored during session, Limited activity within patient's tolerance, Repositioned     Hand Dominance     Extremity/Trunk Assessment Upper Extremity Assessment Upper Extremity Assessment: Generalized weakness   Lower Extremity Assessment Lower Extremity Assessment: Defer to PT evaluation   Cervical / Trunk Assessment Cervical / Trunk Assessment: Kyphotic   Communication Communication Communication: No difficulties   Cognition Arousal/Alertness: Lethargic Behavior During Therapy: Restless Overall Cognitive Status: No family/caregiver present to determine baseline cognitive functioning                                 General Comments: Increased lethargy today. Decreased safety awareness and required cues throughout. Closing eyes during session and requiring increased time throughout for engaging in conversation and following commands     General Comments  Swelling at R food compared to L. Notified RN. placed ice pack and elevated    Exercises     Shoulder Instructions      Home Living Family/patient expects to be discharged to:: Private residence Living Arrangements: Alone Available Help at Discharge: Other (Comment) (reports no one) Type of Home: Apartment Home Access: Elevator     Home Layout: One level     Bathroom Shower/Tub: Producer, television/film/video: Handicapped height     Home Equipment: Production assistant, radio - single point;Hand held shower head          Prior Functioning/Environment Prior Level of Function : Independent/Modified Independent                        OT Problem List: Decreased strength;Decreased range of motion;Decreased activity tolerance;Impaired balance (sitting and/or standing);Decreased  cognition;Decreased safety awareness;Decreased knowledge of use of DME or AE;Pain      OT Treatment/Interventions: Self-care/ADL training;Therapeutic exercise;Energy conservation;DME and/or AE instruction;Therapeutic activities;Patient/family education    OT Goals(Current goals can be found in the care plan section) Acute Rehab OT Goals Patient Stated Goal: Go home OT Goal Formulation: With patient Time For Goal Achievement: 11/09/21 Potential to Achieve Goals: Good  OT Frequency: Min 2X/week    Co-evaluation PT/OT/SLP Co-Evaluation/Treatment: Yes Reason for Co-Treatment: For patient/therapist safety;To address functional/ADL transfers PT goals addressed during session: Mobility/safety with mobility;Balance OT goals addressed during session: ADL's and self-care      AM-PAC OT "6 Clicks" Daily Activity     Outcome Measure Help from another person eating meals?: A Little Help from another person taking care of personal grooming?: A Little Help from another person toileting, which includes using toliet, bedpan, or urinal?: A Lot Help from another person bathing (including washing, rinsing, drying)?: A Lot Help from another person to put on and taking off regular upper body clothing?: A Lot Help from another person to put on and taking off regular lower body clothing?: A Lot 6 Click Score: 14   End of Session Equipment Utilized During Treatment: Rolling walker (2 wheels);Gait belt Nurse Communication: Mobility status  Activity Tolerance: Patient tolerated treatment well Patient left: in bed;with call bell/phone within reach;with bed alarm set;with nursing/sitter in room  OT Visit  Diagnosis: Unsteadiness on feet (R26.81);Other abnormalities of gait and mobility (R26.89);Muscle weakness (generalized) (M62.81)                Time: 4098-1191 OT Time Calculation (min): 14 min Charges:  OT General Charges $OT Visit: 1 Visit OT Evaluation $OT Eval Moderate Complexity: 1 Mod  Evaluna Utke MSOT, OTR/L Acute Rehab Office: [phone]  Theodoro Grist Curley Hogen 10/26/2021, 12:23 PM

## 2021-10-26 NOTE — Progress Notes (Signed)
Daily Progress Note Intern Pager: [phone]  Patient name: Ernest White Medical record number: [id number] Date of birth: 11/18/1955 Age: 66 y.o. Gender: male  Primary Care Provider: Erick Alley, DO Consultants: psych Code Status: FULL  Pt Overview and Major Events to Date:  8/12: Admitted; PCCM consulted due to continued agitation but felt patient appropriate for floor   Assessment and Plan:  LAUREN AGUAYO is a 66 y.o. male p/w fall from 19ft likely 2/2 alcohol intoxication, initially admitted with AKI that has now resolved. Currently being managed for acute EtOH withdrawal. Much improved today, but concern for benzo withdrawal given large amount of Ativan given during his stay, so will start taper prior to discharge.  * Alcohol withdrawal (HCC) Unclear when last drink was but ethanol level was 108 on admission 8/11 at 2350. CIWA overnight trending down, 9>4>0. No longer on scheduled Ativan. Received no Ativan overnight but received multiple doses during his stay. He is still agitated but much improved from before and his cognition has greatly improved.  -Start Librium taper due to large amount of Ativan given during his stay -Psych consulted, appreciate recs -Progressive status for close monitoring -DC restraints, patient may ambulate at bedside. 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  Per office note from July 2019 he was supposed to be on it for 6 months only. Given his significant EtOH use and his recent fall from 7 ft, he seems to be high-risk for chronic anticoagulation. No hx of AF that I am able to find.  -D/c'd xarelto -Started Lovenox for VTE ppx  Rhabdomyolysis Likely traumatic after fall. CK 6292 -> 3967. Renal function is improving with PO intake; AKI has resolved. -Encourage PO when  able -Continuing IVF -AM BMP  Nondisplaced fracture of proximal phalanx of toe After fall. Pain seems to be well controlled.  -PRN Tylenol for pain control -Rolling walker per PT -post-op shoe  Tobacco use 1 pack per day since age 63. Still smoking. -Nicotine patch  Diabetes (HCC) Holding metformin due to elevated lactate on admission. On Jardiance and glipizide at home. Hgb A1c 7.4. -SSI, hold home meds   Essential hypertension Home lisinopril and HCTZ.  -Lisinopril 20mg  qd    FEN/GI: heart healthy diet PPx: lovenox Dispo: Pending TOC. Barriers include placement.   Subjective:  Pt feeling well this morning. Sitting on bedside commode. Able to fully articulate he wishes to go home soon. Has some pain in his foot.  Objective: Temp:  [98.1 F (36.7 C)-98.5 F (36.9 C)] 98.5 F (36.9 C) (08/17 0740) Pulse Rate:  [67-116] 116 (08/17 0740) Resp:  [19-22] 19 (08/17 0740) BP: (146-162)/(83-100) 148/83 (08/17 0740) SpO2:  [90 %-96 %] 90 % (08/17 0740) Physical Exam: General: Sitting on bedside commode. Alert, responsive, redirectable, speaking in full sentences and does not fall asleep midsentence  Laboratory: Most recent CBC Lab Results  Component Value Date   WBC 13.4 (H) 10/22/2021   HGB 12.7 (L) 10/22/2021   HCT 37.9 (L) 10/22/2021   MCV 85.2 10/22/2021   PLT 211 10/22/2021   Most recent BMP    Latest Ref Rng & Units 10/26/2021    2:48 AM  BMP  Glucose 70 - 99 mg/dL 10/28/2021   BUN 8 - 23 mg/dL 9   Creatinine 998 - 3.38 mg/dL 2.50  Sodium 135 - 145 mmol/L 140   Potassium 3.5 - 5.1 mmol/L 3.5   Chloride 98 - 111 mmol/L 107   CO2 22 - 32 mmol/L 24   Calcium 8.9 - 10.3 mg/dL 9.5     Other pertinent labs   TSH 5.352 Mg 2.4   Vonna Drafts, MD 10/26/2021, 11:28 AM  PGY-1, [facility] FPTS Intern pager: [phone], text pages welcome Secure chat group [facility] Teaching Service

## 2021-10-26 NOTE — Progress Notes (Signed)
FMTS Brief Progress Note  S: Seen at bedside with Dr. Anner Crete, after we were paged by nursing staff the patient was getting aggressive.  Upon arrival patient was yelling at nursing staff to let him go home.  He had questions about why he was still being held, and we discussed that after his fall he was in alcohol withdrawal and had high concerns for seizures at the time-which is why he was receiving Ativan.  We also discussed that we were reducing Ativan dosing in order to return him to his baseline safely.  Patient Demanding that we let him go home. We discussed that it is unsafe for him at this time as he is unable to walk.  Patient began to threaten both Dr. Anner Crete and nursing staff.  At which point I reminded the patient not to threaten our staff.  He then started to get out of the bed but stopped short sitting at edge of the bed.  We further attempted to de-escalate the situation, however he began to shout more threats to Dr. Roberto Scales which point we put in orders for Haldol.  After Dr. Anner Crete left the room, nursing staff and I were able to de-escalate the situation by offering to move the patient to a chair.  Patient became less aggressive, however continued to throw racist slurs and insults at nursing staff.  I asked the patient to refrain from insulting our staff, as they are here to help take care of him.  Because the patient is no longer aggressive, we decided to hold Haldol dose at this time.  We offered to have day team come see him in the morning to discuss leaving the hospital, however I do not think this is likely due to his inability to walk at this time.   O: BP (!) 154/86 (BP Location: Right Arm)   Pulse 88   Temp 98.4 F (36.9 C) (Oral)   Resp 20   Ht 5\' 11"  (1.803 m)   Wt (!) 138.8 kg   SpO2 94%   BMI 42.68 kg/m     A/P: - We will continue to hold off on dosing Ativan per psych recommendations - We will continue to de-escalate patient and attempt to prevent medical or physical  restraints - Patient has capacity, however lacks physical ability to walk at this time demonstrated by his difficulty ambulating to chair - Haldol order has been placed, and we discussed with nursing staff that if he becomes violent that he may receive this dose and to page .   - Orders reviewed. Labs for AM ordered, which was adjusted as needed.  - If condition changes, plan includes IV Haldol 1 dose and to contact night team for reevaluation.   [place], MD 10/26/2021, 2:09 AM PGY-1, [facility] Night Resident  Please page [phone] with questions.

## 2021-10-27 ENCOUNTER — Other Ambulatory Visit (HOSPITAL_COMMUNITY): Payer: Self-pay

## 2021-10-27 DIAGNOSIS — S0990XD Unspecified injury of head, subsequent encounter: Secondary | ICD-10-CM | POA: Diagnosis not present

## 2021-10-27 DIAGNOSIS — M546 Pain in thoracic spine: Secondary | ICD-10-CM | POA: Diagnosis not present

## 2021-10-27 DIAGNOSIS — Z7901 Long term (current) use of anticoagulants: Secondary | ICD-10-CM

## 2021-10-27 DIAGNOSIS — F10929 Alcohol use, unspecified with intoxication, unspecified: Secondary | ICD-10-CM | POA: Diagnosis not present

## 2021-10-27 DIAGNOSIS — N179 Acute kidney failure, unspecified: Secondary | ICD-10-CM | POA: Diagnosis not present

## 2021-10-27 LAB — GLUCOSE, CAPILLARY
Glucose-Capillary: 165 mg/dL — ABNORMAL HIGH (ref 70–99)
Glucose-Capillary: 152 mg/dL — ABNORMAL HIGH (ref 70–99)

## 2021-10-27 MED ORDER — CHLORDIAZEPOXIDE HCL 25 MG PO CAPS
25.0000 mg | ORAL_CAPSULE | Freq: Every day | ORAL | 0 refills | Status: DC
  Filled 2021-10-27: qty 30, 30d supply, fill #0

## 2021-10-27 MED ORDER — CHLORDIAZEPOXIDE HCL 25 MG PO CAPS
25.0000 mg | ORAL_CAPSULE | Freq: Two times a day (BID) | ORAL | 0 refills | Status: DC
  Filled 2021-10-27: qty 3, 2d supply, fill #0

## 2021-10-27 MED ORDER — GABAPENTIN 300 MG PO CAPS
300.0000 mg | ORAL_CAPSULE | Freq: Three times a day (TID) | ORAL | 0 refills | Status: DC
  Filled 2021-10-27: qty 90, 30d supply, fill #0

## 2021-10-27 MED ORDER — METFORMIN HCL 1000 MG PO TABS
1000.0000 mg | ORAL_TABLET | Freq: Every day | ORAL | 1 refills | Status: DC
  Filled 2021-10-27: qty 30, 30d supply, fill #0

## 2021-10-27 NOTE — Discharge Instructions (Addendum)
Dear Ernest White,  Thank you for letting us participate in your care. You were hospitalized after your fall. You were found to be intoxicated at the time and were later treated for alcohol withdrawal. You were treated with benzodiazepines for this. You were sent home with a small amount of a benzodiazepine called Librium which you should take as prescribed. You were also given a shoe for your toe fracture. All of your other imaging did not show other fractures. Some of your medications were also changed. Please review the instructions included.  POST-HOSPITAL & CARE INSTRUCTIONS Please review the included details about medications to start, stop, and change the way you take. Please do your best to avoid alcohol. Please stay well hydrated. Go to your follow up appointments (listed below)   DOCTOR'S APPOINTMENT   Future Appointments  Date Time Provider Department Center  10/31/2021  8:30 AM ACCESS TO CARE POOL [id number]    Follow-up Information     Erick Alley, DO Follow up on 10/31/2021.   Specialty: Family Medicine Why: You have an appt in Dr Barnett Applebaum office at 8:30 am Contact information: [address] [place] [address] [phone]                 Take care and be well!  Family Medicine Teaching Service Inpatient Team [place]  [facility]  [address] [place], [place] [address] [phone]

## 2021-10-27 NOTE — TOC Transition Note (Addendum)
Transition of Care [facility]) - CM/SW Discharge Note   Patient Details  Name: Ernest White MRN: [id number] Date of Birth: [birth year]  Transition [facility] CM/SW Contact:  Geralynn Ochs, LCSW Phone Number: 10/27/2021, 1:52 PM   Clinical Narrative:   CSW met with patient to discuss recommendation for SNF, patient is refusing and wants to go home. Patient agreeable to [facility], no preference on agency. Patient also needs RW. CSW sent referral to [facility] and they accepted. RW sent to [employer], they will deliver to the room. Patient said his sister will provide transportation home and will check on him when he's home and make sure he's doing ok. CSW also discussed with patient his alcohol use, and provided resources. Patient complaining of headache and did not want to discuss further, but did take resource packet. No other TOC needs identified at this time.    Final next level of care: [place] Barriers to Discharge: Barriers Resolved   Patient Goals and CMS Choice Patient states their goals for this hospitalization and ongoing recovery are:: refusing SNF, wants to go home today CMS Medicare.gov Compare Post Acute Care list provided to:: Patient Choice offered to / list presented to : Patient  Discharge Placement                Patient to be transferred to facility by: Sister Name of family member notified: Self Patient and family notified of of transfer: 10/27/21  Discharge Plan and Services                DME Arranged: Gilford Rile rolling DME Agency: [employer] Date DME Agency Contacted: 10/27/21   Representative spoke with at DME Agency: Jodell Cipro HH Arranged: PT, OT [place] Agency: [place] Date [place]: 10/27/21   Representative spoke with at [place]: [place]) Interventions     Readmission Risk Interventions     No data to display

## 2021-10-30 NOTE — Progress Notes (Unsigned)
    SUBJECTIVE:   CHIEF COMPLAINT / HPI:   *** Pt hospitalized from 8/11-8/18 after a fall related to alcohol intoxication and was found to have fracture of proximal phalanx, AKI, rhabdomyolysis and later alcohol withdrawal during hospitalization.   Home health PT ordered ***  TSH elevated during hospitalization to ***   Alcohol Use Disorder *** naltexone? Acamprosate?  Medication changes during hospitalization: -D/C'd xarelto d/t EtOH use and falls and was no longer indication -Metformin switched to 1000mg  ER and glipizide D/C'd  PERTINENT  PMH / PSH: ***  OBJECTIVE:   There were no vitals taken for this visit. ***  General: NAD, pleasant, able to participate in exam Cardiac: RRR, no murmurs. Respiratory: CTAB, normal effort, No wheezes, rales or rhonchi Abdomen: Bowel sounds present, nontender, nondistended, no hepatosplenomegaly. Extremities: no edema or cyanosis. Skin: warm and dry, no rashes noted Neuro: alert, no obvious focal deficits Psych: Normal affect and mood  ASSESSMENT/PLAN:   No problem-specific Assessment & Plan notes found for this encounter.     Dr. , DO [place] [facility]    {    This will disappear when note is signed, click to select method of visit    :1}

## 2021-10-31 ENCOUNTER — Other Ambulatory Visit: Payer: Self-pay

## 2021-10-31 ENCOUNTER — Emergency Department (HOSPITAL_COMMUNITY): Payer: Medicare Other

## 2021-10-31 ENCOUNTER — Emergency Department (HOSPITAL_COMMUNITY)
Admission: EM | Admit: 2021-10-31 | Discharge: 2021-11-01 | Disposition: A | Discharge status: Home / Self Care | Payer: Medicare Other | Attending: Student | Admitting: Student

## 2021-10-31 ENCOUNTER — Encounter (HOSPITAL_COMMUNITY): Payer: Self-pay

## 2021-10-31 ENCOUNTER — Ambulatory Visit (INDEPENDENT_AMBULATORY_CARE_PROVIDER_SITE_OTHER): Payer: Medicare Other | Admitting: Student

## 2021-10-31 VITALS — BP 104/68 | HR 98 | Ht 71.0 in

## 2021-10-31 DIAGNOSIS — E119 Type 2 diabetes mellitus without complications: Secondary | ICD-10-CM | POA: Insufficient documentation

## 2021-10-31 DIAGNOSIS — R42 Dizziness and giddiness: Secondary | ICD-10-CM | POA: Diagnosis not present

## 2021-10-31 DIAGNOSIS — R7989 Other specified abnormal findings of blood chemistry: Secondary | ICD-10-CM | POA: Insufficient documentation

## 2021-10-31 DIAGNOSIS — J449 Chronic obstructive pulmonary disease, unspecified: Secondary | ICD-10-CM | POA: Insufficient documentation

## 2021-10-31 DIAGNOSIS — I10 Essential (primary) hypertension: Secondary | ICD-10-CM | POA: Insufficient documentation

## 2021-10-31 DIAGNOSIS — M5412 Radiculopathy, cervical region: Secondary | ICD-10-CM | POA: Insufficient documentation

## 2021-10-31 DIAGNOSIS — Z7984 Long term (current) use of oral hypoglycemic drugs: Secondary | ICD-10-CM | POA: Insufficient documentation

## 2021-10-31 DIAGNOSIS — Z7951 Long term (current) use of inhaled steroids: Secondary | ICD-10-CM | POA: Insufficient documentation

## 2021-10-31 DIAGNOSIS — E0869 Diabetes mellitus due to underlying condition with other specified complication: Secondary | ICD-10-CM

## 2021-10-31 DIAGNOSIS — Z79899 Other long term (current) drug therapy: Secondary | ICD-10-CM | POA: Insufficient documentation

## 2021-10-31 DIAGNOSIS — Z87891 Personal history of nicotine dependence: Secondary | ICD-10-CM | POA: Insufficient documentation

## 2021-10-31 DIAGNOSIS — F1090 Alcohol use, unspecified, uncomplicated: Secondary | ICD-10-CM

## 2021-10-31 DIAGNOSIS — R519 Headache, unspecified: Secondary | ICD-10-CM | POA: Diagnosis not present

## 2021-10-31 DIAGNOSIS — M542 Cervicalgia: Secondary | ICD-10-CM | POA: Diagnosis present

## 2021-10-31 DIAGNOSIS — M25511 Pain in right shoulder: Secondary | ICD-10-CM

## 2021-10-31 DIAGNOSIS — M25512 Pain in left shoulder: Secondary | ICD-10-CM | POA: Insufficient documentation

## 2021-10-31 MED ORDER — BACLOFEN 10 MG PO TABS: 5.0000 mg | ORAL_TABLET | Freq: Three times a day (TID) | ORAL | 0 refills | Status: AC

## 2021-10-31 MED ORDER — DICLOFENAC SODIUM 1 % EX GEL: 4.0000 g | Freq: Four times a day (QID) | CUTANEOUS | 0 refills | Status: AC

## 2021-10-31 NOTE — Patient Instructions (Signed)
It was great to see you! Thank you for allowing me to participate in your care!  I recommend that you always bring your medications to each appointment as this makes it easy to ensure you are on the correct medications and helps [place] not miss when refills are needed.  Our plans for today:  - Return for your 10:30 appointment tomorrow with Dr. Raymondo Band. Please bring your medications to this appointment - STOP taking you blood pressure medications: lisinopril and hydrochlorothiazide  -I sent pain medications to your pharmacy and asked them to deliver today if they can.  -Do not stand up unless you have someone with you -when you do stand up, do it very slowly and hold on to something   Take care and seek immediate care sooner if you develop any concerns.   Dr. Erick Alley, DO [facility]

## 2021-10-31 NOTE — Assessment & Plan Note (Signed)
TSH mildly elevated during hospitalization.  We will check a TSH in a few months when checking A1c

## 2021-10-31 NOTE — ED Provider Triage Note (Signed)
Emergency Medicine Provider Triage Evaluation Note  MENACHEM URBANEK , a 66 y.o. male  was evaluated in triage.  Pt complains of head, neck, and right shoulder pain after a fall two weeks ago. The patient was recently admitted from 8/11-8/18.  Review of Systems  Positive:  Negative:   Physical Exam  There were no vitals taken for this visit. Gen:   Awake,pt irritable Resp:  Normal effort  MSK:   Moves extremities without difficulty  Other:  Diffuse pain  Medical Decision Making  Medically screening exam initiated at 8:34 PM.  Appropriate orders placed.  Zyir L Cozart was informed that the remainder of the evaluation will be completed by another provider, this initial triage assessment does not replace that evaluation, and the importance of remaining in the ED until their evaluation is complete.   CT imaging reviewed, but will re-order CT iamging and XR R shoulder.    Achille Rich, [place] 10/31/21 2036

## 2021-10-31 NOTE — Assessment & Plan Note (Signed)
Patient denies having alcohol use disorder and does not want to start naltrexone.  I suspect patient does have alcohol use disorder given his severe withdrawal symptoms requiring a Librium taper and restraints during his hospitalization.

## 2021-10-31 NOTE — Assessment & Plan Note (Signed)
As patient has good range of motion and pain is going from his neck into his shoulder, this is likely associated with muscle spasms/strain muscle possibly from positioning while in the hospital and he required restraints.  Patient has an allergy to aspirin and states he does not tolerate ibuprofen.  We will start with baclofen and Voltaren gel -Baclofen 5 mg 3 times daily as needed -Voltaren gel 4 times daily as needed -Return for follow-up visit in 2 weeks

## 2021-10-31 NOTE — Assessment & Plan Note (Signed)
Patient would like to which to something besides metformin d/t GI upset.  He was told we can discuss this at a later visit.  He was advised not to take his glipizide.  He will need an A1c check in 3 months. -Follow-up with Dr. Raymondo Band tomorrow to discuss medications

## 2021-10-31 NOTE — ED Triage Notes (Signed)
Pt BIB EMS, he complains of neck and right shoulder pain from a fall 2 weeks ago. Pt was prescribed a cream today from his primary doctor. Pt reports being on a blood thinner.

## 2021-10-31 NOTE — Assessment & Plan Note (Signed)
Blood pressure is on the lower end today and I suspect this is the cause of his lightheadedness.  Hydrochlorothiazide and lisinopril are on his medication list and patient states he takes them however they have not been refilled since 2022 so I am not sure if this is accurate.  He was advised to stop taking them if he has them and to go from lying to sitting to standing very slowly, always have something to hold onto and to have someone with him at all times when standing.  -Follow-up with Dr. Raymondo Band tomorrow to discuss medications -Follow-up in 2 weeks to check BP

## 2021-11-01 ENCOUNTER — Ambulatory Visit (INDEPENDENT_AMBULATORY_CARE_PROVIDER_SITE_OTHER): Payer: Medicare Other | Admitting: Pharmacist

## 2021-11-01 ENCOUNTER — Encounter: Payer: Self-pay | Admitting: Pharmacist

## 2021-11-01 DIAGNOSIS — Z720 Tobacco use: Secondary | ICD-10-CM

## 2021-11-01 DIAGNOSIS — M5412 Radiculopathy, cervical region: Secondary | ICD-10-CM | POA: Diagnosis not present

## 2021-11-01 DIAGNOSIS — E0869 Diabetes mellitus due to underlying condition with other specified complication: Secondary | ICD-10-CM | POA: Diagnosis not present

## 2021-11-01 MED ORDER — LACTATED RINGERS IV BOLUS
1000.0000 mL | Freq: Once | INTRAVENOUS | Status: AC
  Administered 2021-11-01: 1000 mL via INTRAVENOUS

## 2021-11-01 MED ORDER — ACCU-CHEK AVIVA PLUS VI STRP: 1.0000 | ORAL_STRIP | Freq: Two times a day (BID) | 11 refills | Status: AC

## 2021-11-01 MED ORDER — DIPHENHYDRAMINE HCL 50 MG/ML IJ SOLN
25.0000 mg | Freq: Once | INTRAMUSCULAR | Status: AC
  Administered 2021-11-01: 25 mg via INTRAVENOUS
  Filled 2021-11-01: qty 1

## 2021-11-01 MED ORDER — PROCHLORPERAZINE EDISYLATE 10 MG/2ML IJ SOLN
10.0000 mg | Freq: Once | INTRAMUSCULAR | Status: AC
Start: 2021-11-01 — End: 2021-11-01
  Administered 2021-11-01: 10 mg via INTRAVENOUS
  Filled 2021-11-01: qty 2

## 2021-11-01 MED ORDER — DEXAMETHASONE SODIUM PHOSPHATE 10 MG/ML IJ SOLN
10.0000 mg | Freq: Once | INTRAMUSCULAR | Status: AC
  Administered 2021-11-01: 10 mg via INTRAVENOUS
  Filled 2021-11-01: qty 1

## 2021-11-01 MED ORDER — METHYLPREDNISOLONE 4 MG PO TBPK: ORAL_TABLET | ORAL | 0 refills | Status: DC

## 2021-11-01 MED ORDER — OZEMPIC (0.25 OR 0.5 MG/DOSE) 2 MG/3ML ~~LOC~~ SOPN: 0.2500 mg | PEN_INJECTOR | SUBCUTANEOUS | 3 refills | Status: DC

## 2021-11-01 MED ORDER — METHYLPREDNISOLONE 4 MG PO TBPK: ORAL_TABLET | ORAL | 0 refills | Status: AC

## 2021-11-01 MED ORDER — KETOROLAC TROMETHAMINE 15 MG/ML IJ SOLN
15.0000 mg | Freq: Once | INTRAMUSCULAR | Status: AC
  Administered 2021-11-01: 15 mg via INTRAVENOUS
  Filled 2021-11-01: qty 1

## 2021-11-01 NOTE — ED Provider Notes (Signed)
[facility]-EMERGENCY DEPT Provider Note  CSN: [id number] Arrival date & time: 10/31/21 2019  Chief Complaint(s) Neck Pain and Shoulder Pain  HPI Ernest White is a 66 y.o. male with PMH COPD, T2DM, gout, HTN, alcohol abuse who presents emergency department for evaluation of neck and right shoulder pain.  Patient recently admitted on 10/20/2021 for a head injury with associated AKI and alcohol withdrawal.  The patient states that since discharge he followed up yesterday because he is having persistent neck pain and right shoulder pain since the fall.  He states that the pain starts in the neck and shoots down into his right hand.  Endorses an associated headache.  He states that he is taking his home gabapentin, Voltaren and Percocet without relief.  He denies weakness of the upper extremity as well as any signs and symptoms of withdrawal.  Denies chest pain, shortness of breath, nausea, vomiting or other systemic symptoms.  Patient states that he has not had a drink of alcohol since he was admitted.   Past Medical History Past Medical History:  Diagnosis Date   Arthritis    COPD (chronic obstructive pulmonary disease) (HCC)    Diabetes mellitus without complication (HCC)    Gout    Hypertension    Patient Active Problem List   Diagnosis Date Noted   Acute pain of left shoulder 10/31/2021   Light headed 10/31/2021   Elevated TSH 10/31/2021   Alcohol use disorder 10/31/2021   Acute bilateral thoracic back pain    Acute head injury    Alcoholic intoxication with complication (HCC)    Delirium    Rhabdomyolysis 10/22/2021   Chronic anticoagulation 10/22/2021   Alcohol withdrawal (HCC) 10/21/2021   Nondisplaced fracture of proximal phalanx of toe 10/21/2021   Acute kidney injury (HCC) 10/21/2021   Morbid obesity (HCC) 06/17/2019   Dysgeusia 06/10/2019   GERD (gastroesophageal reflux disease) 10/20/2018   Chronic low back pain without sciatica 04/16/2018    Pulmonary nodules/lesions, multiple 09/10/2017   Pulmonary emboli (HCC) 09/05/2017   Snoring 04/23/2017   Erectile dysfunction 08/16/2016   Tobacco use 06/14/2016   Hemorrhoids 06/03/2016   Diabetes (HCC) 12/27/2015   Chronic obstructive airway disease (HCC) 12/23/2015   Essential hypertension 12/15/2015   Home Medication(s) Prior to Admission medications   Medication Sig Start Date End Date Taking? Authorizing Provider  acetaminophen (TYLENOL) 500 MG tablet Take 1,000 mg by mouth every 6 (six) hours as needed for mild pain.    [provider]  albuterol (VENTOLIN HFA) 108 (90 Base) MCG/ACT inhaler INHALE 2 PUFFS INTO THE LUNGS EVERY 6 HOURS AS NEEDED FOR WHEEZING OR SHORTNESS OF BREATH Patient taking differently: Inhale 2 puffs into the lungs every 6 (six) hours as needed for wheezing or shortness of breath. 04/18/21   Coralyn HellingSood, Vineet, MD  atorvastatin (LIPITOR) 20 MG tablet Take 20 mg by mouth every evening. 10/04/21   [provider]  baclofen (LIORESAL) 10 MG tablet Take 0.5 tablets (5 mg total) by mouth 3 (three) times daily. 10/31/21   Erick AlleyJones, Sarah, DO  chlordiazePOXIDE (LIBRIUM) 25 MG capsule Take 1 capsule (25 mg total) by mouth 2 (two) times daily for 1 day, then take 1 capsule (25 mg total) by mouth daily for 1 day. Patient not taking: Reported on 10/31/2021 10/27/21   Nestor RampNeal, Sara L, MD  diclofenac Sodium (VOLTAREN) 1 % GEL Apply 4 g topically 4 (four) times daily. 10/31/21   Erick AlleyJones, Sarah, DO  gabapentin (NEURONTIN) 300 MG  capsule Take 1 capsule (300 mg total) by mouth 3 (three) times daily. 10/27/21   Alicia Amel, MD  hydrochlorothiazide (HYDRODIURIL) 25 MG tablet TAKE 1 TABLET(25 MG) BY MOUTH DAILY Patient taking differently: Take 25 mg by mouth daily. 05/11/20   Simmons-Robinson, Makiera, MD  hydrocortisone (ANUSOL-HC) 2.5 % rectal cream Place 1 application rectally 2 (two) times daily. 06/26/19   Darin Engels, Sherin, DO  JANUVIA 100 MG tablet Take 100 mg by mouth daily.  10/11/21   [provider]  JARDIANCE 25 MG TABS tablet TAKE 1 TABLET(25 MG) BY MOUTH DAILY Patient taking differently: Take 25 mg by mouth daily. 08/29/21   Simmons-Robinson, Makiera, MD  lidocaine (LIDODERM) 5 % Place 1 patch onto the skin daily. Remove & Discard patch within 12 hours or as directed by MD Patient not taking: Reported on 10/31/2021 04/17/21   Army Melia A, PA-C  lisinopril (ZESTRIL) 40 MG tablet TAKE 1 TABLET(40 MG) BY MOUTH DAILY Patient taking differently: Take 40 mg by mouth daily. 09/05/20   Simmons-Robinson, Tawanna Cooler, MD  metFORMIN (GLUCOPHAGE) 1000 MG tablet Take 1 tablet (1,000 mg total) by mouth daily with breakfast. 10/27/21   Alicia Amel, MD  nicotine (NICODERM CQ - DOSED IN MG/24 HOURS) 21 mg/24hr patch Place 1 patch (21 mg total) onto the skin daily. 10/27/21   Sabino Dick, DO  SYMBICORT 160-4.5 MCG/ACT inhaler Inhale 2 puffs into the lungs 2 (two) times daily. 10/03/21   [provider]  topiramate (TOPAMAX) 25 MG tablet Take 50 mg by mouth 2 (two) times daily. 10/02/21   [provider]  vitamin B-12 (CYANOCOBALAMIN) 1000 MCG tablet Take 1,000 mcg by mouth daily.    [provider]                                                                                                                                    Past Surgical History Past Surgical History:  Procedure Laterality Date   APPENDECTOMY     HERNIA REPAIR     Family History Family History  Problem Relation Age of Onset   Deep vein thrombosis Brother    Pulmonary embolism Brother    Cancer - Lung Father    Cancer Sister        breast   Cancer Sister        throat   Other Sister        back problems   Heart disease Sister     Social History Social History   Tobacco Use   Smoking status: Former    Packs/day: 1.00    Years: 48.00    Total pack years: 48.00    Types: Cigarettes    Quit date: 10/25/2020    Years since quitting: 1.0   Smokeless  tobacco: Never   Tobacco comments:    Worked at a tobacco plant   Vaping Use   Vaping Use: Never used  Substance Use Topics   Alcohol use: Yes    Alcohol/week: 1.0 standard drink of alcohol    Types: 1 Cans of beer per week    Comment: "moderately"   Drug use: No   Allergies Penicillins and Aspirin  Review of Systems Review of Systems  Musculoskeletal:  Positive for arthralgias and neck pain.    Physical Exam Vital Signs  I have reviewed the triage vital signs BP 125/84 (BP Location: Right Arm)   Pulse 96   Temp 98.3 F (36.8 C) (Oral)   Resp 18   SpO2 94%   Physical Exam Vitals and nursing note reviewed.  Constitutional:      General: He is not in acute distress.    Appearance: He is well-developed.  HENT:     Head: Normocephalic and atraumatic.  Eyes:     Conjunctiva/sclera: Conjunctivae normal.  Cardiovascular:     Rate and Rhythm: Normal rate and regular rhythm.     Heart sounds: No murmur heard. Pulmonary:     Effort: Pulmonary effort is normal. No respiratory distress.     Breath sounds: Normal breath sounds.  Abdominal:     Palpations: Abdomen is soft.     Tenderness: There is no abdominal tenderness.  Musculoskeletal:        General: No swelling.     Cervical back: Neck supple. Tenderness present.  Skin:    General: Skin is warm and dry.     Capillary Refill: Capillary refill takes less than 2 seconds.  Neurological:     Mental Status: He is alert.     Cranial Nerves: No cranial nerve deficit.     Sensory: No sensory deficit.     Motor: No weakness.  Psychiatric:        Mood and Affect: Mood normal.     ED Results and Treatments Labs (all labs ordered are listed, but only abnormal results are displayed) Labs Reviewed - No data to display                                                                                                                        Radiology CT Head Wo Contrast  Result Date: 10/31/2021 CLINICAL DATA:  Head and neck  trauma. EXAM: CT HEAD WITHOUT CONTRAST CT CERVICAL SPINE WITHOUT CONTRAST TECHNIQUE: Multidetector CT imaging of the head and cervical spine was performed following the standard protocol without intravenous contrast. Multiplanar CT image reconstructions of the cervical spine were also generated. RADIATION DOSE REDUCTION: This exam was performed according to the departmental dose-optimization program which includes automated exposure control, adjustment of the mA and/or kV according to patient size and/or use of iterative reconstruction technique. COMPARISON:  CT head and CT scan cervical spine both 10/21/2021. FINDINGS: CT HEAD FINDINGS Brain: There is mild cerebral atrophy with moderately advanced small-vessel disease of the cerebral white matter. The cerebellum and brainstem are unremarkable. The ventricles are normal in size and position. No acute infarct, hemorrhage or mass are seen.  Vascular: There are trace calcifications in the siphons. There are no hyperdense central vessels. Skull: There is no visible scalp hematoma. The calvarium, skull base and orbits are intact. Sinuses/Orbits: No acute findings. Other: No mastoid effusion is seen. 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 CT CERVICAL SPINE FINDINGS Alignment: Straightened slightly reversed cervical lordosis is again noted. No AP listhesis. Skull base and vertebrae: No acute fracture is evident. There is suboptimal image resolution below the level of C4 due to beam hardening artifact from the patient's superimposed shoulders. No primary bone lesion or focal pathologic process. There is normal bone mineralization. Soft tissues and spinal canal: No prevertebral fluid or swelling. No visible canal hematoma but the spinal canal contents are very poorly demonstrated below the level of C4. Disc levels: There are normal disc heights C2-3, C4-5 and C7-T1, moderate disc space loss C3-4, advanced disc collapse C5-6 and C6-7. There are  bidirectional endplate osteophytes all levels except C2-3 with variable encroachment on the thecal sac by posterior disc osteophyte complexes, with at least mild spondylotic cord compression suspected at C3-4, C5-6 and C6-7. There are uncinate and facet osteophytes all levels, ankylosis across the left C2-3 facet joints, and degenerative foraminal stenosis which is moderate on the right at C3-4, bilaterally moderate C4-5, bilaterally mild-to-moderate C5-6, bilaterally moderate to severe C6-7, and moderate to severe on the left at C7-T1. This was seen previously. Upper chest: Negative. Other: None. IMPRESSION: 1. No acute intracranial CT findings or depressed skull fractures. 2. Straightened and slightly reversed cervical lordosis without evidence of fractures or listhesis. 3. Chronic cervical spine degenerative change, and 3 levels with suspected spondylotic cord compression. 4. Mild cerebral atrophy with moderately advanced small-vessel disease. 5. 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 Electronically Signed   By: Almira Bar M.D.   On: 10/31/2021 21:13   CT Cervical Spine Wo Contrast  Result Date: 10/31/2021 CLINICAL DATA:  Head and neck trauma. EXAM: CT HEAD WITHOUT CONTRAST CT CERVICAL SPINE WITHOUT CONTRAST TECHNIQUE: Multidetector CT imaging of the head and cervical spine was performed following the standard protocol without intravenous contrast. Multiplanar CT image reconstructions of the cervical spine were also generated. RADIATION DOSE REDUCTION: This exam was performed according to the departmental dose-optimization program which includes automated exposure control, adjustment of the mA and/or kV according to patient size and/or use of iterative reconstruction technique. COMPARISON:  CT head and CT scan cervical spine both 10/21/2021. FINDINGS: CT HEAD FINDINGS Brain: There is mild cerebral atrophy with moderately advanced small-vessel disease of the cerebral white matter. The  cerebellum and brainstem are unremarkable. The ventricles are normal in size and position. No acute infarct, hemorrhage or mass are seen. Vascular: There are trace calcifications in the siphons. There are no hyperdense central vessels. Skull: There is no visible scalp hematoma. The calvarium, skull base and orbits are intact. Sinuses/Orbits: No acute findings. Other: No mastoid effusion is seen. 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 CT CERVICAL SPINE FINDINGS Alignment: Straightened slightly reversed cervical lordosis is again noted. No AP listhesis. Skull base and vertebrae: No acute fracture is evident. There is suboptimal image resolution below the level of C4 due to beam hardening artifact from the patient's superimposed shoulders. No primary bone lesion or focal pathologic process. There is normal bone mineralization. Soft tissues and spinal canal: No prevertebral fluid or swelling. No visible canal hematoma but the spinal canal contents are very poorly demonstrated below the level of C4. Disc levels:  There are normal disc heights C2-3, C4-5 and C7-T1, moderate disc space loss C3-4, advanced disc collapse C5-6 and C6-7. There are bidirectional endplate osteophytes all levels except C2-3 with variable encroachment on the thecal sac by posterior disc osteophyte complexes, with at least mild spondylotic cord compression suspected at C3-4, C5-6 and C6-7. There are uncinate and facet osteophytes all levels, ankylosis across the left C2-3 facet joints, and degenerative foraminal stenosis which is moderate on the right at C3-4, bilaterally moderate C4-5, bilaterally mild-to-moderate C5-6, bilaterally moderate to severe C6-7, and moderate to severe on the left at C7-T1. This was seen previously. Upper chest: Negative. Other: None. IMPRESSION: 1. No acute intracranial CT findings or depressed skull fractures. 2. Straightened and slightly reversed cervical lordosis without evidence of  fractures or listhesis. 3. Chronic cervical spine degenerative change, and 3 levels with suspected spondylotic cord compression. 4. Mild cerebral atrophy with moderately advanced small-vessel disease. 5. 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 Electronically Signed   By: Almira Bar M.D.   On: 10/31/2021 21:13   DG Shoulder Right  Result Date: 10/31/2021 CLINICAL DATA:  Right shoulder pain, fall EXAM: RIGHT SHOULDER - 2+ VIEW COMPARISON:  None Available. FINDINGS: Degenerative changes in the [facility] joint with joint space narrowing and spurring. Glenohumeral joint is maintained. No acute bony abnormality. Specifically, no fracture, subluxation, or dislocation. Soft tissues are intact. IMPRESSION: Degenerative changes in the right AC joint. No acute bony abnormality. Electronically Signed   By: Charlett Nose M.D.   On: 10/31/2021 21:04    Pertinent labs & imaging results that were available during my care of the patient were reviewed by me and considered in my medical decision making (see MDM for details).  Medications Ordered in ED Medications - No data to display                                                                                                                                   Procedures Procedures  (including critical care time)  Medical Decision Making / ED Course   This patient presents to the ED for concern of headache, neck pain, shoulder pain, this involves an extensive number of treatment options, and is a complaint that carries with it a high risk of complications and morbidity.  The differential diagnosis includes cervical radiculopathy, cervical stenosis, foraminal stenosis, fracture, ligamentous injury  MDM: Seen the emergency room for evaluation of neck pain and shoulder pain.  Physical exam with tenderness at the paraspinal C-spine on the right but is otherwise unremarkable.  No focal motor or sensory deficits.  X-ray of the shoulder shows AC joint  arthritis but is otherwise unremarkable.  CT head unremarkable.  CT C-spine with likely chronic spondylitic cervical compression.  Patient given headache cocktail with Toradol, Compazine, Benadryl and Decadron and on reevaluation his symptoms have much improved.  Suspect patient's pain is multifactorial given his AC joint arthritis and his cervical  compression but given lack of hard neurologic deficits and inability to obtain an MRI at night, we will have the patient follow-up with neurosurgery and I will discharge patient on a Medrol Dosepak.  Patient given strict return precautions which he voiced understanding he was discharged.   Additional history obtained:  -External records from outside source obtained and reviewed including: Chart review including previous notes, labs, imaging, consultation notes   Imaging Studies ordered: I ordered imaging studies including CT head, C-spine, right shoulder x-ray I independently visualized and interpreted imaging. I agree with the radiologist interpretation   Medicines ordered and prescription drug management: No orders of the defined types were placed in this encounter.   -I have reviewed the patients home medicines and have made adjustments as needed  Critical interventions none    Cardiac Monitoring: The patient was maintained on a cardiac monitor.  I personally viewed and interpreted the cardiac monitored which showed an underlying rhythm of: NSR  Social Determinants of Health:  Factors impacting patients care include: none   Reevaluation: After the interventions noted above, I reevaluated the patient and found that they have :improved  Co morbidities that complicate the patient evaluation  Past Medical History:  Diagnosis Date   Arthritis    COPD (chronic obstructive pulmonary disease) (HCC)    Diabetes mellitus without complication (HCC)    Gout    Hypertension       Dispostion: I considered admission for this patient, but  he does not meet inpatient criteria for admission he is safe for discharge with outpatient follow-up     Final Clinical Impression(s) / ED Diagnoses Final diagnoses:  None     @PCDICTATION @    Rozell Kettlewell, , MD 11/01/21 [phone]

## 2021-11-01 NOTE — Progress Notes (Signed)
S:     Chief Complaint  Patient presents with   Medication Management    Medication Review    Ernest White is a 66 y.o. male who presents for medication management due to concern of polypharmacy.   PMH is significant for T2DM, hypertenison, tobacco use and alcohol use disorder. Patient was referred and last seen by Primary Care Provider, Dr. Yetta Barre, on 10/31/21.   Today, He arrives in low spirits requiring a wheelchair to walk to the room.   Family/Social History: Lives alone, moved to [place] 6 years ago and has retired.  - Reports over 100 pounds of weight gain since moving to [place] from [place], [place].  - Smoker, quit in the past for 6 months but has started back up again. Smokes 1 PPD of [employer].   Physical activity: Reports walking 3x/week, 1-1.5 hours on each walk.  Insurance Coverage: [employer]  Do you know what each of your medicines is for? yes Have you been experiencing any side effects to the medications prescribed? Yes, feels metformin has made him gain weight. Do you ever have trouble remembering to take your medicine? No, uses pill pack to make it easy to rememebr How many days in the past week did you miss taking your medicines? None Do you have any problems obtaining medications due to transportation? Yes in the past, now gets medications delivered by pharmacy In the past 6 months, have you missed getting a refill or a new prescription filled on time? no   O:  Physical Exam Psychiatric:        Mood and Affect: Mood normal.        Thought Content: Thought content normal.     Review of Systems  Musculoskeletal:  Positive for neck pain.  Neurological:  Positive for dizziness and weakness.    Vitals:   11/01/21 1224  BP: 132/77  Pulse: (!) 106  SpO2: 94%     A/P: Polypharmacy:  Understanding of regimen: fair  Understanding of indications: fair  Potential of compliance: fair  Patient has known adherence challenges  (including missing all of his medications for multiple days) based on above barriers, including lack of knowledge, forgetfulness,  concern for more harm than good, cost. Medication list reviewed and updated. The following issues were noted baclofen had not been started nor had the diclofenac gel been used.  Patient was seen in the emergency room overnight and was treated for a "pinched nerve" per patient report with decadron, ketorolac, combined with diphenhydramine and prochlorperazine.   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  He does not believe that metformin is helping his weight loss and requests it be stopped.  He desires weight loss.   -Stop metformin and sitagliptan. -Start Ozempic (semaglutide) 0.25mg  once weekly.  Patient educated on purpose, proper use and potential adverse effects of nausea.  Following instruction patient verbalized understanding of treatment plan.  - Contacted [facility] to cancel metformin and sitagliptan from his pill packs. Pharmacy staff is aware and will remove from future pill pack dispensings.   Chronic tobacco use disorder in a patient contemplative about quitting in the near future. Patient says importance to quit smoking, 8/10. He has quit for 6 months in the past and is very confident he can quit again, says he could quit today if he really wanted to. Worried about gaining weight if he quits smoking. Bought a  bike to exercise but is giving it away to his nephew now.   Written patient instructions and updated medication list provided. Total time in face to face counseling 48 minutes.    Follow up Pharmacist PRN.  PCP Clinic Visit on 9/13. Patient seen with Cherie Ouch, PharmD Candidate and Rennis Petty, PharmD PGY-1 Resident. Marland Kitchen

## 2021-11-01 NOTE — Assessment & Plan Note (Signed)
Chronic tobacco use disorder in a patient contemplative about quitting in the near future. Patient says importance to quit smoking, 8/10. He has quit for 6 months in the past and is very confident he can quit again, says he could quit today if he really wanted to. Worried about gaining weight if he quits smoking. Bought a bike to exercise but is giving it away to his nephew now.

## 2021-11-01 NOTE — ED Notes (Signed)
Attempted to take bp. When I was adjusting the bp cuff, pt started saying the cuff was hurting him. I responded telling him that the cuff was no longer on his arm. Pt continuously pleaded for me to take the cuff off, I repeated that the cuff was no longer on his arm. Pt would not stop with repeated verbalizations even after exiting room.

## 2021-11-01 NOTE — Assessment & Plan Note (Signed)
  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  He does not believe that metformin is helping his weight loss and requests it be stopped.  He desires weight loss.   -Stop metformin and sitagliptan. -Start Ozempic (semaglutide) 0.25mg  once weekly.  Patient educated on purpose, proper use and potential adverse effects of nausea.  Following instruction patient verbalized understanding of treatment plan.

## 2021-11-01 NOTE — Progress Notes (Signed)
Reviewed: I agree with Dr. Koval's documentation and management. 

## 2021-11-01 NOTE — Patient Instructions (Addendum)
Stop Metformin Stop Januvia Start Ozempic (semaglutide) 0.25 mg injection once weekly Finish taking the Medrol dose pack steroids as directed This medication may increase blood sugar, call us if your blood sugar is high.  Continue to take Baclofen once daily prior to bedtime for muscle pain.  Next follow-up with Dr. Yetta Barre on 9/13

## 2021-11-03 ENCOUNTER — Emergency Department (HOSPITAL_COMMUNITY): Payer: Medicare Other

## 2021-11-03 ENCOUNTER — Emergency Department (HOSPITAL_COMMUNITY)
Admission: EM | Admit: 2021-11-03 | Discharge: 2021-11-04 | Discharge status: Against Medical Advice | Payer: Medicare Other | Attending: Emergency Medicine | Admitting: Emergency Medicine

## 2021-11-03 ENCOUNTER — Encounter (HOSPITAL_COMMUNITY): Payer: Self-pay | Admitting: *Deleted

## 2021-11-03 ENCOUNTER — Other Ambulatory Visit: Payer: Self-pay

## 2021-11-03 DIAGNOSIS — Z5321 Procedure and treatment not carried out due to patient leaving prior to being seen by health care provider: Secondary | ICD-10-CM | POA: Insufficient documentation

## 2021-11-03 DIAGNOSIS — M25511 Pain in right shoulder: Secondary | ICD-10-CM | POA: Insufficient documentation

## 2021-11-03 NOTE — ED Triage Notes (Signed)
Pt arrives from home via [facility] with c/o right shoulder pain. Hx of neuropathy. Had a fall about 2 weeks ago, evaluated for the same at [facility], no falls since then. 170/110 has not had his BP meds today, 95 hr 18rr, 94% ra, cbg 182.

## 2021-11-03 NOTE — ED Triage Notes (Signed)
Pt states right shoulder pain for about 2 weeks, pain not getting an better, the pain is worse. He has taken all the medication that was prescribed to him.

## 2021-11-04 DIAGNOSIS — M25511 Pain in right shoulder: Secondary | ICD-10-CM | POA: Diagnosis not present

## 2021-11-04 LAB — COMPREHENSIVE METABOLIC PANEL
ALT: 49 U/L — ABNORMAL HIGH (ref 0–44)
AST: 26 U/L (ref 15–41)
Albumin: 3.9 g/dL (ref 3.5–5.0)
Alkaline Phosphatase: 57 U/L (ref 38–126)
Anion gap: 8 (ref 5–15)
BUN: 15 mg/dL (ref 8–23)
CO2: 27 mmol/L (ref 22–32)
Calcium: 9.4 mg/dL (ref 8.9–10.3)
Chloride: 103 mmol/L (ref 98–111)
Creatinine, Ser: 1.03 mg/dL (ref 0.61–1.24)
GFR, Estimated: >60 mL/min (ref >60)
Glucose, Bld: 159 mg/dL — ABNORMAL HIGH (ref 70–99)
Potassium: 3.6 mmol/L (ref 3.5–5.1)
Sodium: 138 mmol/L (ref 135–145)
Total Bilirubin: 0.4 mg/dL (ref 0.3–1.2)
Total Protein: 6.8 g/dL (ref 6.5–8.1)

## 2021-11-04 LAB — SALICYLATE LEVEL: Salicylate Lvl: <7 mg/dL — ABNORMAL LOW (ref 7.0–30.0)

## 2021-11-04 LAB — ACETAMINOPHEN LEVEL: Acetaminophen (Tylenol), Serum: <10 ug/mL — ABNORMAL LOW (ref 10–30)

## 2021-11-04 NOTE — ED Notes (Signed)
Called pt x3 for room, no response. 

## 2021-11-04 NOTE — ED Notes (Signed)
Pt asking for pain medication while in the lobby, pt states he cannot take ibuprofen because he is a diabetic. I offered tylenol, pt says he would like to take that. Reports he has been taking that at home and "I took 160 of the rapid release just yesterday" I clarified the dose and number of pills several time with the patient, he continued to concur the same. Then he says "well, maybe I didn't take 160, but I took at least 50. I took the entire bottle and then I went and got another one. I had only about 10 left"

## 2021-11-07 ENCOUNTER — Emergency Department (HOSPITAL_COMMUNITY)
Admission: EM | Admit: 2021-11-07 | Discharge: 2021-11-07 | Discharge status: Against Medical Advice | Payer: Medicare Other | Attending: Emergency Medicine | Admitting: Emergency Medicine

## 2021-11-07 ENCOUNTER — Other Ambulatory Visit: Payer: Self-pay

## 2021-11-07 DIAGNOSIS — M25511 Pain in right shoulder: Secondary | ICD-10-CM | POA: Insufficient documentation

## 2021-11-07 DIAGNOSIS — Z5321 Procedure and treatment not carried out due to patient leaving prior to being seen by health care provider: Secondary | ICD-10-CM | POA: Diagnosis not present

## 2021-11-07 DIAGNOSIS — W11XXXA Fall on and from ladder, initial encounter: Secondary | ICD-10-CM | POA: Insufficient documentation

## 2021-11-07 NOTE — ED Triage Notes (Addendum)
Patient complains right shoulder pain. Patient fell off a ladder 2 weeks ago. Today patient states pain is the same. ETOH on board. Patient states he was using alcohol to help with pain.

## 2021-11-09 ENCOUNTER — Other Ambulatory Visit: Payer: Self-pay | Admitting: Family Medicine

## 2021-11-09 ENCOUNTER — Ambulatory Visit
Admission: RE | Admit: 2021-11-09 | Discharge: 2021-11-09 | Disposition: A | Discharge status: Home / Self Care | Payer: Medicare Other | Source: Ambulatory Visit | Attending: Family Medicine | Admitting: Family Medicine

## 2021-11-09 DIAGNOSIS — S92401A Displaced unspecified fracture of right great toe, initial encounter for closed fracture: Secondary | ICD-10-CM

## 2021-11-22 ENCOUNTER — Ambulatory Visit (INDEPENDENT_AMBULATORY_CARE_PROVIDER_SITE_OTHER): Payer: Medicare Other | Admitting: Student

## 2021-11-22 ENCOUNTER — Encounter: Payer: Self-pay | Admitting: Student

## 2021-11-22 VITALS — BP 131/84 | HR 89 | Ht 71.0 in | Wt 288.6 lb

## 2021-11-22 DIAGNOSIS — E0869 Diabetes mellitus due to underlying condition with other specified complication: Secondary | ICD-10-CM | POA: Diagnosis not present

## 2021-11-22 DIAGNOSIS — M25512 Pain in left shoulder: Secondary | ICD-10-CM

## 2021-11-22 DIAGNOSIS — M542 Cervicalgia: Secondary | ICD-10-CM

## 2021-11-22 DIAGNOSIS — R42 Dizziness and giddiness: Secondary | ICD-10-CM | POA: Diagnosis not present

## 2021-11-22 MED ORDER — OZEMPIC (0.25 OR 0.5 MG/DOSE) 2 MG/3ML ~~LOC~~ SOPN: 0.5000 mg | PEN_INJECTOR | SUBCUTANEOUS | 3 refills | Status: AC

## 2021-11-22 MED ORDER — GABAPENTIN 300 MG PO CAPS: 300.0000 mg | ORAL_CAPSULE | Freq: Three times a day (TID) | ORAL | 0 refills | Status: DC

## 2021-11-22 NOTE — Assessment & Plan Note (Signed)
Likely due to cervical compression seen on CT scan last month.  Patient was provided with phone number to [facility] who he was referred to by the ED last month so he can call and schedule an appointment.

## 2021-11-22 NOTE — Progress Notes (Signed)
    SUBJECTIVE:   CHIEF COMPLAINT / HPI:   T2DM On 11/01/21 metformin and Sitaliptan were d/c'd and pt started on Ozempic 0.25mg  weekly which he states he started but he messed up the needle this past Monday and he needs a new needle. He also states he has been taking 0.5 mg, not 0.25 mg and tolerating it well. He does have some nausea but thinks this is d/t dizziness as it was occurring before he started the ozempic.    Head and neck pain Has had occipital head and neck pain for past couple weeks after a 7 foot fall weeks ago. 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  He was referred to neurosurgery but states he was never told this and has not received a call from neurosurgery.  Dizziness  Pt complains of dizziness since he fell. States it is constant. Feels dizzy even when lying in the bed. Feels nauseated on occasion but no vomiting.    PERTINENT  PMH / PSH: Alcohol use disorder, T2DM  OBJECTIVE:   Vitals:   11/22/21 0918  BP: 131/84  Pulse: 89  SpO2: 98%    General: NAD, pleasant, able to participate in exam Cardiac: RRR, no murmurs. Respiratory: CTAB, normal effort, No wheezes, rales or rhonchi Skin: warm and dry, no rashes noted Neuro: alert, cranial nerves II through XII intact, sensation intact, finger-nose-finger test normal   ASSESSMENT/PLAN:   Neck pain Likely due to cervical compression seen on CT scan last month.  Patient was provided with phone number to [facility] who he was referred to by the ED last month so he can call and schedule an appointment.  Dizziness His dizziness is constant in nature, less likely to be vertigo.  Need to rule out cerebellar stroke as this can be missed on head CT which was previously negative. -Noncontrast MRI brain ordered  [facility]) Prescription for Ozempic 0.5 mg weekly sent to pharmacy.  Patient offered to follow-up with pharmacy team next week to ensure he is  using his Ozempic pen correctly since his needle broke this past Monday.  Patient states he feels confident he knows how to use the pen and does not want to come for an appointment with pharmacy team.  Instead he is willing to follow-up with me next week on 9/19 to discuss other issues and can brin Ozempic pen at that appointment if needed.  Pt has several health maintenance care gaps and other complaints he would like to discuss. He is scheduled to return to see me on 11/28/21.   Dr. Erick Alley, DO [facility]

## 2021-11-22 NOTE — Patient Instructions (Addendum)
It was great to see you! Thank you for allowing me to participate in your care!  I recommend that you always bring your medications to each appointment as this makes it easy to ensure you are on the correct medications and helps [place] not miss when refills are needed.  Our plans for today:  -A refill of your gabapentin and Ozempic were sent to your pharmacy -You were previously referred to a neurosurgeon due to a compression in your cervical spine.  Please call [place] [facility] to schedule an appointment at [phone] - I have ordered an MRI of your brain due to the dizziness you have been experiencing  -You next appointment with me is on Tuesday 9/19 at 11:00 am    Take care and seek immediate care sooner if you develop any concerns.   Dr. Erick Alley, DO [facility]

## 2021-11-22 NOTE — Assessment & Plan Note (Signed)
Prescription for Ozempic 0.5 mg weekly sent to pharmacy.  Patient offered to follow-up with pharmacy team next week to ensure he is using his Ozempic pen correctly since his needle broke this past Monday.  Patient states he feels confident he knows how to use the pen and does not want to come for an appointment with pharmacy team.  Instead he is willing to follow-up with me next week on 9/19 to discuss other issues and can brin Ozempic pen at that appointment if needed.

## 2021-11-22 NOTE — Assessment & Plan Note (Signed)
His dizziness is constant in nature, less likely to be vertigo.  Need to rule out cerebellar stroke as this can be missed on head CT which was previously negative. -Noncontrast MRI brain ordered

## 2021-11-25 ENCOUNTER — Other Ambulatory Visit: Payer: Self-pay | Admitting: Family Medicine

## 2021-11-28 ENCOUNTER — Telehealth: Payer: Self-pay | Admitting: Student

## 2021-11-28 ENCOUNTER — Ambulatory Visit: Payer: Medicare Other | Admitting: Student

## 2021-11-28 NOTE — Telephone Encounter (Signed)
Patient walked in to request refill of:  Name of Medication(s):  gabapentin   Last date of OV:  11/22/21 Pharmacy:  Same  Will route refill request to Clinic RN.  Discussed with patient policy to call pharmacy for future refills.  Also, discussed refills may take up to 48 hours to approve or deny.  Creig Hines

## 2021-12-04 ENCOUNTER — Other Ambulatory Visit (HOSPITAL_COMMUNITY): Payer: Medicare Other

## 2021-12-08 ENCOUNTER — Other Ambulatory Visit: Payer: Self-pay | Admitting: Neurological Surgery

## 2021-12-08 DIAGNOSIS — M542 Cervicalgia: Secondary | ICD-10-CM

## 2021-12-11 ENCOUNTER — Telehealth: Payer: Self-pay | Admitting: Student

## 2021-12-11 DIAGNOSIS — M25512 Pain in left shoulder: Secondary | ICD-10-CM

## 2021-12-12 NOTE — Telephone Encounter (Signed)
Pharmacy calls nurse line in regards to Gabapentin prescription.   Pharmacy acknowledges they are requesting prescription refill sooner then due. However, they report they are needing to package his pill packs.   Will forward to PCP.

## 2021-12-14 ENCOUNTER — Other Ambulatory Visit: Payer: Self-pay | Admitting: Student

## 2021-12-14 DIAGNOSIS — M25512 Pain in left shoulder: Secondary | ICD-10-CM

## 2021-12-15 ENCOUNTER — Ambulatory Visit (HOSPITAL_COMMUNITY): Payer: Medicare Other | Attending: Family Medicine

## 2021-12-19 ENCOUNTER — Ambulatory Visit: Payer: Medicare Other | Admitting: Student

## 2021-12-21 ENCOUNTER — Ambulatory Visit
Admission: RE | Admit: 2021-12-21 | Discharge: 2021-12-21 | Disposition: A | Discharge status: Home / Self Care | Payer: Medicare Other | Source: Ambulatory Visit | Attending: Family Medicine | Admitting: Family Medicine

## 2021-12-21 ENCOUNTER — Other Ambulatory Visit: Payer: Self-pay | Admitting: Family Medicine

## 2021-12-21 DIAGNOSIS — R058 Other specified cough: Secondary | ICD-10-CM

## 2022-01-01 ENCOUNTER — Other Ambulatory Visit: Payer: Self-pay | Admitting: Student

## 2022-01-01 DIAGNOSIS — M25512 Pain in left shoulder: Secondary | ICD-10-CM

## 2022-01-02 ENCOUNTER — Ambulatory Visit
Admission: RE | Admit: 2022-01-02 | Discharge: 2022-01-02 | Disposition: A | Discharge status: Home / Self Care | Payer: Medicare Other | Source: Ambulatory Visit | Attending: Neurological Surgery | Admitting: Neurological Surgery

## 2022-01-02 ENCOUNTER — Ambulatory Visit: Payer: Medicare Other | Admitting: Student

## 2022-01-02 DIAGNOSIS — M542 Cervicalgia: Secondary | ICD-10-CM

## 2022-01-05 ENCOUNTER — Ambulatory Visit: Payer: Medicare Other | Admitting: Family Medicine

## 2022-01-09 ENCOUNTER — Other Ambulatory Visit: Payer: Self-pay | Admitting: Student

## 2022-01-09 DIAGNOSIS — M25512 Pain in left shoulder: Secondary | ICD-10-CM

## 2022-02-06 ENCOUNTER — Other Ambulatory Visit: Payer: Self-pay | Admitting: Student

## 2022-02-06 DIAGNOSIS — M25512 Pain in left shoulder: Secondary | ICD-10-CM

## 2022-03-02 ENCOUNTER — Other Ambulatory Visit: Payer: Self-pay | Admitting: Student

## 2022-03-02 DIAGNOSIS — M25512 Pain in left shoulder: Secondary | ICD-10-CM

## 2022-03-27 ENCOUNTER — Other Ambulatory Visit: Payer: Self-pay | Admitting: Student

## 2022-04-02 ENCOUNTER — Other Ambulatory Visit: Payer: Self-pay | Admitting: Student

## 2022-04-02 DIAGNOSIS — M25512 Pain in left shoulder: Secondary | ICD-10-CM

## 2022-05-21 ENCOUNTER — Other Ambulatory Visit: Payer: Self-pay | Admitting: Student

## 2022-05-21 DIAGNOSIS — M25512 Pain in left shoulder: Secondary | ICD-10-CM

## 2022-06-18 ENCOUNTER — Other Ambulatory Visit: Payer: Self-pay | Admitting: Student

## 2022-06-18 DIAGNOSIS — M25512 Pain in left shoulder: Secondary | ICD-10-CM

## 2022-06-25 ENCOUNTER — Other Ambulatory Visit: Payer: Self-pay | Admitting: Student

## 2022-06-25 DIAGNOSIS — M25512 Pain in left shoulder: Secondary | ICD-10-CM

## 2022-07-12 ENCOUNTER — Telehealth: Payer: Self-pay | Admitting: Student

## 2022-07-12 NOTE — Telephone Encounter (Signed)
Called patient to schedule Medicare Annual Wellness Visit (AWV). Left message for patient to call back and schedule Medicare Annual Wellness Visit (AWV).  Last date of AWV: AWVI eligible as of 05/10/2020  Please schedule an AWVI appointment at any time with [facility] VISIT.  If any questions, please contact me at [phone].    Thank you,  [facility] Direct dial  [phone]

## 2022-07-16 ENCOUNTER — Other Ambulatory Visit: Payer: Self-pay | Admitting: Student

## 2022-07-16 DIAGNOSIS — M25512 Pain in left shoulder: Secondary | ICD-10-CM

## 2022-08-13 ENCOUNTER — Other Ambulatory Visit: Payer: Self-pay | Admitting: Student

## 2022-08-13 DIAGNOSIS — M25512 Pain in left shoulder: Secondary | ICD-10-CM

## 2022-08-21 ENCOUNTER — Other Ambulatory Visit: Payer: Self-pay | Admitting: Student

## 2022-08-21 DIAGNOSIS — M25512 Pain in left shoulder: Secondary | ICD-10-CM

## 2022-09-10 ENCOUNTER — Other Ambulatory Visit: Payer: Self-pay | Admitting: Student

## 2022-09-10 DIAGNOSIS — M25512 Pain in left shoulder: Secondary | ICD-10-CM

## 2022-09-18 ENCOUNTER — Other Ambulatory Visit: Payer: Self-pay | Admitting: Student

## 2022-09-18 DIAGNOSIS — M25512 Pain in left shoulder: Secondary | ICD-10-CM

## 2022-10-31 ENCOUNTER — Other Ambulatory Visit: Payer: Self-pay | Admitting: Student

## 2022-10-31 DIAGNOSIS — M25512 Pain in left shoulder: Secondary | ICD-10-CM

## 2022-11-09 ENCOUNTER — Other Ambulatory Visit: Payer: Self-pay | Admitting: Student

## 2022-11-09 DIAGNOSIS — M25512 Pain in left shoulder: Secondary | ICD-10-CM

## 2022-11-29 ENCOUNTER — Other Ambulatory Visit: Payer: Self-pay

## 2022-11-29 DIAGNOSIS — M25512 Pain in left shoulder: Secondary | ICD-10-CM

## 2022-11-30 MED ORDER — GABAPENTIN 300 MG PO CAPS: 300.0000 mg | ORAL_CAPSULE | Freq: Three times a day (TID) | ORAL | 0 refills | Status: DC

## 2022-12-10 ENCOUNTER — Other Ambulatory Visit: Payer: Self-pay | Admitting: Student

## 2022-12-10 DIAGNOSIS — M25512 Pain in left shoulder: Secondary | ICD-10-CM

## 2022-12-25 ENCOUNTER — Ambulatory Visit: Payer: 59

## 2022-12-26 ENCOUNTER — Other Ambulatory Visit: Payer: Self-pay | Admitting: Student

## 2022-12-26 DIAGNOSIS — M25512 Pain in left shoulder: Secondary | ICD-10-CM

## 2023-01-07 ENCOUNTER — Ambulatory Visit: Payer: 59

## 2023-01-07 VITALS — Ht 71.0 in | Wt 285.0 lb

## 2023-01-07 DIAGNOSIS — Z0000 Encounter for general adult medical examination without abnormal findings: Secondary | ICD-10-CM

## 2023-01-07 NOTE — Patient Instructions (Signed)
Mr. Avino , Thank you for taking time to come for your Medicare Wellness Visit. I appreciate your ongoing commitment to your health goals. Please review the following plan we discussed and let me know if I can assist you in the future.   Referrals/Orders/Follow-Ups/Clinician Recommendations: No  This is a list of the screening recommended for you and due dates:  Health Maintenance  Topic Date Due   Eye exam for diabetics  Never done   Yearly kidney health urinalysis for diabetes  Never done   Colon Cancer Screening  Never done   Zoster (Shingles) Vaccine (1 of 2) Never done   Pneumonia Vaccine (2 of 2 - PCV) 07/23/2017   Complete foot exam   11/26/2018   Hemoglobin A1C  04/23/2022   Flu Shot  10/11/2022   Screening for Lung Cancer  10/22/2022   Yearly kidney function blood test for diabetes  11/05/2022   COVID-19 Vaccine (6 - 2023-24 season) 11/11/2022   Medicare Annual Wellness Visit  01/07/2024   DTaP/Tdap/Td vaccine (3 - Td or Tdap) 10/22/2031   Hepatitis C Screening  Completed   HPV Vaccine  Aged Out    Advanced directives: (Declined) Advance directive discussed with you today. Even though you declined this today, please call our office should you change your mind, and we can give you the proper paperwork for you to fill out.  Next Medicare Annual Wellness Visit scheduled for next year: Yes

## 2023-01-07 NOTE — Progress Notes (Cosign Needed Addendum)
Subjective:   Ernest White is a 67 y.o. male who presents for an Initial Medicare Annual Wellness Visit.  Visit Complete: Virtual I connected with  Ernest White on 01/07/23 by a audio enabled telemedicine application and verified that I am speaking with the correct person using two identifiers.  Patient Location: Home  Provider Location: Office/Clinic  I discussed the limitations of evaluation and management by telemedicine. The patient expressed understanding and agreed to proceed.  Vital Signs: Because this visit was a virtual/telehealth visit, some criteria may be missing or patient reported. Any vitals not documented were not able to be obtained and vitals that have been documented are patient reported.  Cardiac Risk Factors include: advanced age (>46men, >69 women);diabetes mellitus;dyslipidemia;family history of premature cardiovascular disease;hypertension;male gender;obesity (BMI >30kg/m2);smoking/ tobacco exposure     Objective:    Today's Vitals   01/07/23 1547  Weight: 285 lb (129.3 kg)  Height: 5\' 11"  (1.803 m)  PainSc: 0-No pain   Body mass index is 39.75 kg/m.     01/07/2023    4:15 PM 11/22/2021    9:20 AM 11/07/2021    4:47 AM 11/03/2021   10:20 PM 10/31/2021    8:28 PM 04/17/2021    7:44 PM 03/16/2021    5:03 PM  Advanced Directives  Does Patient Have a Medical Advance Directive? No No No No No No No  Would patient like information on creating a medical advance directive? No - Patient declined No - Patient declined  No - Patient declined No - Patient declined  No - Patient declined    Current Medications (verified) Outpatient Encounter Medications as of 01/07/2023  Medication Sig   ACCU-CHEK AVIVA PLUS test strip 1 each by Other route in the morning and at bedtime.   acetaminophen (TYLENOL) 500 MG tablet Take 1,000 mg by mouth every 6 (six) hours as needed for mild pain.   albuterol (VENTOLIN HFA) 108 (90 Base) MCG/ACT inhaler INHALE 2 PUFFS INTO THE  LUNGS EVERY 6 HOURS AS NEEDED FOR WHEEZING OR SHORTNESS OF BREATH (Patient taking differently: Inhale 2 puffs into the lungs every 6 (six) hours as needed for wheezing or shortness of breath.)   atorvastatin (LIPITOR) 20 MG tablet Take 20 mg by mouth every evening.   baclofen (LIORESAL) 10 MG tablet Take 0.5 tablets (5 mg total) by mouth 3 (three) times daily. (Patient not taking: Reported on 11/01/2021)   diclofenac Sodium (VOLTAREN) 1 % GEL Apply 4 g topically 4 (four) times daily.   gabapentin (NEURONTIN) 300 MG capsule TAKE 1 TABLET BY MOUTH 3 TIMES DAILY NEEDS APPT BEFORE NEXT REFILL   glipiZIDE (GLUCOTROL XL) 2.5 MG 24 hr tablet Take 2.5 mg by mouth daily.   hydrochlorothiazide (HYDRODIURIL) 25 MG tablet TAKE 1 TABLET(25 MG) BY MOUTH DAILY (Patient taking differently: Take 25 mg by mouth daily.)   hydrocortisone (ANUSOL-HC) 2.5 % rectal cream Place 1 application rectally 2 (two) times daily.   JARDIANCE 25 MG TABS tablet TAKE 1 TABLET(25 MG) BY MOUTH DAILY (Patient taking differently: Take 25 mg by mouth daily.)   lidocaine (LIDODERM) 5 % Place 1 patch onto the skin daily. Remove & Discard patch within 12 hours or as directed by MD   lisinopril (ZESTRIL) 40 MG tablet TAKE 1 TABLET(40 MG) BY MOUTH DAILY   methylPREDNISolone (MEDROL DOSEPAK) 4 MG TBPK tablet Take as prescribed   nicotine (NICODERM CQ - DOSED IN MG/24 HOURS) 21 mg/24hr patch Place 1 patch (21 mg total) onto the  skin daily. (Patient not taking: Reported on 11/01/2021)   Semaglutide,0.25 or 0.5MG /DOS, (OZEMPIC, 0.25 OR 0.5 MG/DOSE,) 2 MG/3ML SOPN Inject 0.5 mg into the skin once a week.   SYMBICORT 160-4.5 MCG/ACT inhaler Inhale 2 puffs into the lungs 2 (two) times daily.   topiramate (TOPAMAX) 25 MG tablet Take 50 mg by mouth 2 (two) times daily.   vitamin B-12 (CYANOCOBALAMIN) 1000 MCG tablet Take 1,000 mcg by mouth daily.   XARELTO 20 MG TABS tablet Take 20 mg by mouth daily.   No facility-administered encounter medications on  file as of 01/07/2023.    Allergies (verified) Penicillins and Aspirin   History: Past Medical History:  Diagnosis Date   Arthritis    COPD (chronic obstructive pulmonary disease) (HCC)    Diabetes mellitus without complication (HCC)    Gout    Hypertension    Past Surgical History:  Procedure Laterality Date   APPENDECTOMY     HERNIA REPAIR     Family History  Problem Relation Age of Onset   Deep vein thrombosis Brother    Pulmonary embolism Brother    Cancer - Lung Father    Cancer Sister        breast   Cancer Sister        throat   Other Sister        back problems   Heart disease Sister    Social History   Socioeconomic History   Marital status: Single    Spouse name: Not on file   Number of children: Not on file   Years of education: Not on file   Highest education level: Not on file  Occupational History   Not on file  Tobacco Use   Smoking status: Every Day    Current packs/day: 0.00    Average packs/day: 1 pack/day for 48.0 years (48.0 ttl pk-yrs)    Types: Cigarettes    Start date: 10/25/1972    Last attempt to quit: 10/25/2020    Years since quitting: 2.2   Smokeless tobacco: Never   Tobacco comments:    Worked at a tobacco plant   Vaping Use   Vaping status: Never Used  Substance and Sexual Activity   Alcohol use: Yes    Alcohol/week: 1.0 standard drink of alcohol    Types: 1 Cans of beer per week    Comment: "moderately"   Drug use: No   Sexual activity: Never  Other Topics Concern   Not on file  Social History Narrative   Not on file   Social Determinants of Health   Financial Resource Strain: Low Risk  (01/07/2023)   Overall Financial Resource Strain (CARDIA)    Difficulty of Paying Living Expenses: Not hard at all  Food Insecurity: No Food Insecurity (01/07/2023)   Hunger Vital Sign    Worried About Running Out of Food in the Last Year: Never true    Ran Out of Food in the Last Year: Never true  Transportation Needs: No  Transportation Needs (01/07/2023)   PRAPARE - Administrator, Civil Service (Medical): No    Lack of Transportation (Non-Medical): No  Physical Activity: Sufficiently Active (01/07/2023)   Exercise Vital Sign    Days of Exercise per Week: 5 days    Minutes of Exercise per Session: 30 min  Stress: No Stress Concern Present (01/07/2023)   [employer] of Occupational Health - Occupational Stress Questionnaire    Feeling of Stress : Not at all  Social  Connections: Unknown (01/07/2023)   Social Connection and Isolation Panel [NHANES]    Frequency of Communication with Friends and Family: More than three times a week    Frequency of Social Gatherings with Friends and Family: More than three times a week    Attends Religious Services: Not on Marketing executive or Organizations: Yes    Attends Engineer, structural: More than 4 times per year    Marital Status: Not on file    Tobacco Counseling Ready to quit: Not Answered Counseling given: Not Answered 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 Derick Seminara, LPN.   Activities of Daily Living    01/07/2023    4:17 PM  In your present state of health, do you have any difficulty performing the following activities:  Hearing? 0  Vision? 1  Difficulty concentrating or making decisions? 0  Walking or climbing stairs? 0  Dressing or bathing? 0  Doing errands, shopping? 0  Preparing Food and eating ? N  Using  the Toilet? N  In the past six months, have you accidently leaked urine? N  Do you have problems with loss of bowel control? N  Managing your Medications? N  Managing your Finances? N  Housekeeping or managing your Housekeeping? N    Patient Care Team: Erick Alley, DO as PCP - General (Family Medicine)  Indicate any recent Medical Services you may have received from other than [facility] providers in the past year (date may be approximate).     Assessment:   This is a routine wellness examination for Ernest White.  Hearing/Vision screen Hearing Screening - Comments:: Patient denied any hearing difficulty.   No hearing aids.  Vision Screening - Comments:: Patient does wear corrective lenses/contacts.  Patient is overdue for annual eye exam.    Goals Addressed             This Visit's Progress    Client understands the importance of follow-up with providers by attending scheduled visits        Depression Screen    01/07/2023    4:12 PM 11/22/2021    9:20 AM 10/31/2021    8:38 AM 03/16/2021    5:04 PM 06/17/2019    7:57 PM 06/17/2019    8:58 AM 06/10/2019    3:00 PM  PHQ 2/9 Scores  PHQ - 2 Score 0 0 6 0 0 0 0  PHQ- 9 Score 0 11 26        Fall Risk    01/07/2023    4:16 PM 11/22/2021    9:20 AM 10/31/2021    8:38 AM 03/16/2021    5:04 PM 06/17/2019    8:58 AM  Fall Risk   Falls in the past year? 0 0 1 0 0  Number falls in past yr: 0 0 0  0  Injury with Fall? 0 0 1  0  Risk for fall due to : No Fall Risks  History  of fall(s);Impaired balance/gait    Follow up Falls prevention discussed  Falls evaluation completed      MEDICARE RISK AT HOME: Medicare Risk at Home Any stairs in or around the home?: No If so, are there any without handrails?: No Home free of loose throw rugs in walkways, pet beds, electrical cords, etc?: Yes Adequate lighting in your home to reduce risk of falls?: Yes Life alert?: Yes Use of a cane, walker or w/c?: No Grab bars in the bathroom?: No Shower  chair or bench in shower?: No Elevated toilet seat or a handicapped toilet?: No  TIMED UP AND GO:  Was the test performed? No    Cognitive Function:    01/07/2023    4:17 PM  MMSE - Mini Mental State Exam  Not completed: Unable to complete        01/07/2023    4:17 PM  6CIT Screen  What Year? 0 points  What month? 0 points  What time? 0 points  Count back from 20 0 points  Months in reverse 0 points  Repeat phrase 0 points  Total Score 0 points    Immunizations Immunization History  Administered Date(s) Administered   Influenza,inj,Quad PF,6+ Mos 11/25/2017, 01/22/2019   PFIZER Comirnaty(Gray Top)Covid-19 Tri-Sucrose Vaccine 07/06/2019, 07/27/2019, 02/08/2020, 08/11/2020, 03/17/2021   Pneumococcal Polysaccharide-23 07/23/2016   Tdap 08/16/2016, 10/21/2021    TDAP status: Up to date  Flu Vaccine status: Need verification from patient.  Pneumococcal vaccine status: Due, Education has been provided regarding the importance of this vaccine. Advised may receive this vaccine at local pharmacy or Health Dept. Aware to provide a copy of the vaccination record if obtained from local pharmacy or Health Dept. Verbalized acceptance and understanding.  Covid-19 Vaccine status: Need verification from patient.  Qualifies for Shingles Vaccine? Yes   Zostavax completed No   Shingrix Completed?: No.    Education has been provided regarding the importance of this vaccine. Patient has been advised to call insurance company to determine out of pocket expense if they have not yet received this vaccine. Advised may also receive vaccine at local pharmacy or Health Dept. Verbalized acceptance and understanding.  Screening Tests Health Maintenance  Topic Date Due   OPHTHALMOLOGY EXAM  Never done   Diabetic kidney evaluation - Urine ACR  Never done   Colonoscopy  Never done   Zoster Vaccines- Shingrix (1 of 2) Never done   Pneumonia Vaccine 41+ Years old (2 of 2 - PCV) 07/23/2017   FOOT  EXAM  11/26/2018   HEMOGLOBIN A1C  04/23/2022   INFLUENZA VACCINE  10/11/2022   Lung Cancer Screening  10/22/2022   Diabetic kidney evaluation - eGFR measurement  11/05/2022   COVID-19 Vaccine (6 - 2023-24 season) 11/11/2022   Medicare Annual Wellness (AWV)  01/07/2024   DTaP/Tdap/Td (3 - Td or Tdap) 10/22/2031   Hepatitis C Screening  Completed   HPV VACCINES  Aged Out    Health Maintenance  Health Maintenance Due  Topic Date Due   OPHTHALMOLOGY EXAM  Never done   Diabetic kidney evaluation - Urine ACR  Never done   Colonoscopy  Never done   Zoster Vaccines- Shingrix (1 of 2) Never done   Pneumonia Vaccine 81+ Years old (2 of 2 - PCV) 07/23/2017   FOOT EXAM  11/26/2018   HEMOGLOBIN A1C  04/23/2022   INFLUENZA VACCINE  10/11/2022   Lung Cancer Screening  10/22/2022   Diabetic kidney evaluation - eGFR measurement  11/05/2022   COVID-19  Vaccine (6 - 2023-24 season) 11/11/2022    Colorectal cancer screening: Never done.  Lung Cancer Screening: (Low Dose CT Chest recommended if Age 25-80 years, 20 pack-year currently smoking OR have quit w/in 15years.) does qualify.   Lung Cancer Screening Referral: Needs order from pcp.  Additional Screening:  Hepatitis C Screening: does qualify; Completed 08/16/2016  Vision Screening: Recommended annual ophthalmology exams for early detection of glaucoma and other disorders of the eye. Is the patient up to date with their annual eye exam?  No  Who is the provider or what is the name of the office in which the patient attends annual eye exams? Needs referral to rule out diabetic retinopathy. If pt is not established with a provider, would they like to be referred to a provider to establish care? No .   Dental Screening: Recommended annual dental exams for proper oral hygiene  Diabetic Foot Exam: Overdue; last done 11/25/2017  Community Resource Referral / Chronic Care Management: CRR required this visit?  No   CCM required this visit?   PCP informed of CCM need    Plan:     I have personally reviewed and noted the following in the patient's chart:   Medical and social history Use of alcohol, tobacco or illicit drugs  Current medications and supplements including opioid prescriptions. Patient is not currently taking opioid prescriptions. Functional ability and status Nutritional status Physical activity Advanced directives List of other physicians Hospitalizations, surgeries, and ER visits in previous 12 months Vitals Screenings to include cognitive, depression, and falls Referrals and appointments  In addition, I have reviewed and discussed with patient certain preventive protocols, quality metrics, and best practice recommendations. A written personalized care plan for preventive services as well as general preventive health recommendations were provided to patient.     Mickeal Needy, LPN   [id number]   After Visit Summary: (MyChart) Due to this being a telephonic visit, the after visit summary with patients personalized plan was offered to patient via MyChart   Nurse Notes: See routing comments

## 2023-01-08 ENCOUNTER — Other Ambulatory Visit: Payer: Self-pay | Admitting: Student

## 2023-01-08 DIAGNOSIS — M25512 Pain in left shoulder: Secondary | ICD-10-CM

## 2023-01-22 NOTE — Addendum Note (Signed)
Addended by: Mickeal Needy on: 01/22/2023 04:02 PM   Modules accepted: Level of Service

## 2023-01-23 ENCOUNTER — Other Ambulatory Visit: Payer: Self-pay | Admitting: Student

## 2023-01-23 DIAGNOSIS — M25512 Pain in left shoulder: Secondary | ICD-10-CM

## 2023-02-26 NOTE — Progress Notes (Deleted)
    SUBJECTIVE:   CHIEF COMPLAINT / HPI:   T2DM A1c 10/21/2021 of 7.4.  Health maintenance Lung cancer screening Colonoscopy Shingrix COVID Pneumonia vaccine  PERTINENT  PMH / PSH: ***  OBJECTIVE:   There were no vitals taken for this visit. ***  General: NAD, pleasant, able to participate in exam Cardiac: RRR, no murmurs. Respiratory: CTAB, normal effort, No wheezes, rales or rhonchi Abdomen: Bowel sounds present, nontender, nondistended, no hepatosplenomegaly. Extremities: no edema or cyanosis. Skin: warm and dry, no rashes noted Neuro: alert, no obvious focal deficits Psych: Normal affect and mood  ASSESSMENT/PLAN:   No problem-specific Assessment & Plan notes found for this encounter.     Dr. Erick Alley, DO  [facility]    {    This will disappear when note is signed, click to select method of visit    :1}

## 2023-02-27 ENCOUNTER — Ambulatory Visit: Payer: 59 | Admitting: Student

## 2023-04-19 ENCOUNTER — Other Ambulatory Visit: Payer: Self-pay | Admitting: Student

## 2023-04-19 DIAGNOSIS — M25512 Pain in left shoulder: Secondary | ICD-10-CM

## 2023-08-20 ENCOUNTER — Encounter: Payer: Self-pay | Admitting: *Deleted
